# Patient Record
Sex: Male | Born: 1935 | Race: White | Hispanic: No | Marital: Married | Smoking: Former smoker
Health system: Southern US, Community
[De-identification: ages and names within clinical notes are randomized; demographics above are authoritative.]

## PROBLEM LIST (undated history)

## (undated) DIAGNOSIS — J439 Emphysema, unspecified: Secondary | ICD-10-CM

## (undated) DIAGNOSIS — B019 Varicella without complication: Secondary | ICD-10-CM

## (undated) DIAGNOSIS — R519 Headache, unspecified: Secondary | ICD-10-CM

## (undated) DIAGNOSIS — I499 Cardiac arrhythmia, unspecified: Secondary | ICD-10-CM

## (undated) DIAGNOSIS — I10 Essential (primary) hypertension: Secondary | ICD-10-CM

## (undated) DIAGNOSIS — R51 Headache: Secondary | ICD-10-CM

## (undated) DIAGNOSIS — Z860100 Personal history of colon polyps, unspecified: Secondary | ICD-10-CM

## (undated) DIAGNOSIS — K5792 Diverticulitis of intestine, part unspecified, without perforation or abscess without bleeding: Secondary | ICD-10-CM

## (undated) DIAGNOSIS — J45909 Unspecified asthma, uncomplicated: Secondary | ICD-10-CM

## (undated) DIAGNOSIS — T7840XA Allergy, unspecified, initial encounter: Secondary | ICD-10-CM

## (undated) DIAGNOSIS — M1990 Unspecified osteoarthritis, unspecified site: Secondary | ICD-10-CM

## (undated) HISTORY — DX: Emphysema, unspecified: J43.9

## (undated) HISTORY — DX: Allergy, unspecified, initial encounter: T78.40XA

## (undated) HISTORY — DX: Cardiac arrhythmia, unspecified: I49.9

## (undated) HISTORY — DX: Diverticulitis of intestine, part unspecified, without perforation or abscess without bleeding: K57.92

## (undated) HISTORY — DX: Headache: R51

## (undated) HISTORY — DX: Unspecified asthma, uncomplicated: J45.909

## (undated) HISTORY — PX: TONSILLECTOMY: SUR1361

## (undated) HISTORY — DX: Varicella without complication: B01.9

## (undated) HISTORY — DX: Personal history of colon polyps, unspecified: Z86.0100

## (undated) HISTORY — PX: BACK SURGERY: SHX140

## (undated) HISTORY — DX: Unspecified osteoarthritis, unspecified site: M19.90

## (undated) HISTORY — PX: APPENDECTOMY: SHX54

## (undated) HISTORY — DX: Personal history of colonic polyps: Z86.010

## (undated) HISTORY — DX: Essential (primary) hypertension: I10

## (undated) HISTORY — DX: Headache, unspecified: R51.9

---

## 2006-08-04 ENCOUNTER — Ambulatory Visit (HOSPITAL_COMMUNITY): Admission: RE | Admit: 2006-08-04 | Discharge: 2006-08-04 | Payer: Self-pay | Admitting: Neurology

## 2006-08-04 ENCOUNTER — Encounter: Admission: RE | Admit: 2006-08-04 | Discharge: 2006-08-04 | Payer: Self-pay | Admitting: Neurology

## 2007-11-22 ENCOUNTER — Inpatient Hospital Stay (HOSPITAL_COMMUNITY): Admission: RE | Admit: 2007-11-22 | Discharge: 2007-11-27 | Payer: Self-pay | Admitting: Specialist

## 2007-11-22 ENCOUNTER — Ambulatory Visit: Payer: Self-pay | Admitting: Cardiology

## 2007-11-22 ENCOUNTER — Ambulatory Visit: Payer: Self-pay | Admitting: Emergency Medicine

## 2007-11-24 ENCOUNTER — Encounter: Payer: Self-pay | Admitting: Cardiology

## 2007-11-26 ENCOUNTER — Ambulatory Visit: Payer: Self-pay | Admitting: Physical Medicine & Rehabilitation

## 2008-02-27 ENCOUNTER — Encounter: Admission: RE | Admit: 2008-02-27 | Discharge: 2008-02-27 | Payer: Self-pay | Admitting: Specialist

## 2008-10-14 ENCOUNTER — Encounter: Admission: RE | Admit: 2008-10-14 | Discharge: 2008-10-14 | Payer: Self-pay | Admitting: Specialist

## 2010-10-19 NOTE — Op Note (Signed)
NAMECLERANCE, UMLAND NO.:  [id number]   MEDICAL RECORD NO.:  [id number]          PATIENT TYPE:  INP   LOCATION:  3315                         FACILITY:  [facility]   PHYSICIAN:  Kerrin Champagne, M.D.   DATE OF BIRTH:  May 01, 1926   DATE OF PROCEDURE:  11/22/2007  DATE OF DISCHARGE:                               OPERATIVE REPORT   PREOPERATIVE DIAGNOSIS:  Lumbar spinal stenosis L3-L4 and L4-L5 with  spondylolisthesis at L3-L4 and L4-L5 degenerative type.   POSTOPERATIVE DIAGNOSES:  1. Severe bilateral lateral recess stenosis L3-L4 and L4-L5 with      biforaminal entrapment of the nerve roots, right side greater than      left within the neuroforamen.  2. Grade-I spondylolisthesis, L3-L4.  3. Grade-II spondylolisthesis, L4-L5.  4. Synovial cyst, right L4-L5.   PROCEDURE:  1. Central decompressive laminectomy, L3-L4 and L4-L5 with right L3-L4      and right L4-L5 transforaminal lumbar interbody fusion using local      bone graft and a 11-mm DePuy Concorde cages.  2. Posterior lateral fusion L3-L4 and L4-L5 with DePuy Monarch pedicle      screws and rods internal fixation with use of VITOSS and local bone      graft.  3. Bone marrow aspirate left L3 pedicle vertebral body aspiration,      this is for charging VITOSS.   SURGEON:  Kerrin Champagne, MD   ASSISTANT:  Wende Neighbors, PA-C   ANESTHESIA:  General via orotracheal intubation by Dr. Arta Bruce.   FINDINGS:  As above.  Degenerative type spondylolisthesis L3-L4, L4-L5.  Synovial cyst right side L4-L5 affecting primarily the L5 nerve root.   ESTIMATED BLOOD LOSS:  450 mL.   COMPLICATIONS:  None.   DRAINS:  1. Foley with straight drain.  2. Hemovac, right lower lumbar spine to suction drain.   The patient returned to the PACU in good condition.   HISTORY OF PRESENT ILLNESS:  The patient is a 77 year old male with  history of significant COPD.  He has developed progressive worsening,  discomfort, and  weakness in the right lower extremity with dorsiflexion  weakness.  He has a baseline left foot drop associated with the history  of previous polio as a child.  Because of increasing weakness in his  right lower extremity, he underwent evaluation.  MRI study demonstrated  significant biforaminal stenosis L4-L5 associated with grade I-II  spondylolisthesis degenerative type as well lateral recess stenosis in  L3-L4 and a degenerative grade-I spondylolisthesis of the segment.  Remaining segments did not show any significant stenosis or significant  nerve root compression.  He underwent attempts of conservative  management including therapy to use epidural steroids to pain control.  These have not been of benefit.  This pain has worsened with significant  increasing weakness in the right lower extremities with slight  dorsiflexion.  He was brought to the operating room to undergo a  decompression of L3-L4 and L4-L5 with posterolateral fusion and TLIF  procedure, right side, decompression of the L3 and L4 nerve roots  as  this is the pattern of this pain and weakness and posterolateral fusion  with adequate screw instrumentation for stabilization of the 2 level  degenerative spondylolisthesis.   INTRAOPERATIVE FINDINGS:  As above.   DESCRIPTION OF PROCEDURE:  After adequate general anesthesia, the  patient was transferred to the [facility] spine table.  All pressure points  were well padded and the legs with PAS stockings.  A Foley catheter was  installed prior to turning.  The patient had placement of  neuromonitoring leads in the lower extremities and upper extremities.  A  standard preoperative antibiotics with vancomycin for PENICILLIN allergy  of BIAXIN and MACROLIDE allergies.  The patient had a pre-incision time  out at which time, the patient was identified as well as personal within  the operating room.  Concerns regarding the patient's medical status  were reviewed as well as his  allergies.  The procedure as described.  A  standard prep with DuraPrep solution from the lower dorsal spine to the  sacral segment was draped in the usual manner.  Iodine Vi-Drape was  used.  The incision extending from approximately L1 to the L5 level  inferiorly through the skin and subcutaneous layers in the midline after  infiltration of Marcaine 0.5% of 1:200000 epinephrine, incision was  carried sharply down to the spinous processes.  Then the incision  carried over the lateral aspects of the spinous process of L5, L4, L3,  L2, and L1.  The paralumbar muscles were then incised off the lateral  aspects of the spinous process of L5, L4, L3, L2 and then some portion  of L1 and carried down laterally exposing the lamina bilaterally.  Cobbs  were used to elevate the paralumbar muscles and the electrocautery was  used to align the attachment at the level of bone at the level of the  facets.  The facet gaps of L3-L4 and L4-L5 were removed bilaterally and  the dissection was then carried laterally.  Intraoperative lateral  radiograph was obtained with clamps over the first spinous process of  L2, L3, and L4, which were then moved down to L3, L4 and L5, identifying  these levels, they were marked with Bovie electrocautery as well as  marking pen.  Next, the exposure was continued out laterally exposing a  transverse process at L5, L4, L3 at the L2-L3 facet level L3-L4 facet  level and L4-L5 facet levels.  The facets at the L3-L4 and L4-L5 level  noted to be extremely hypertrophic and enlarged.   Bleeders controlled using bipolar and monopolar electrocautery.  The  patient then had placement of bipolar retractor, which was installed for  excellent exposure.   Final gutters were then carefully packed with lap pads in order to  control bleeding and hemostasis here.  The spinous process of L4 and L3  were then resected as well as inferior third of the L2 spinous process.  These were then  taken down to the central portion of the lamina of the  L4 and L5.  The lamina posteriorly was then pinned using Leksell  rongeur.  Osteotomes were then used to osteotomized the facet on the  right side of the L4-L5 level and L3 level resecting the intrarticular  process of L4 and at the L3 in order to expose the facet joint itself.  Superior articular process of both L5 and of L4 were similarly resected  at the level of superior aspect of the pedicle in both levels.   Osteotomes were  used to further osteotomized the medial aspect of the  facet of the left L4-L5 and L3-L4 levels.  More than approximately 30%  to 40% of the facet and decompressing the lateral canal using Kerrison  to the level of pedicle of L5, L4, and L3.  A foraminotomy was performed  over the both L5 nerve roots and the lateral recess was decompressed  using osteotomes to osteotomized the superior articular process medially  at L5 bilaterally as well as L4 bilaterally.  Ligamentum flavum was  debrided.  The L2-L3 level decompressing this and exiting them centrally  as well.  The L5 nerve roots were exiting freely and this created hockey-  stick nerve probe passing up to the neuroforamen both sides.  There was  hypertrophic ligamentum flavum within the neuroforamen at the L4-L5  level and this was resected using a 3-mm Kerrison loop-magnification  headlamp proceeding from both the L4-L5 neuroforamen and then resecting  hypertropic flavum as well as superior articular process of L5 on the  left side in order to decompress left L4 nerve root.  Similarly, this  was carried down to the L3-L4 level beginning about the L3 nerve root  resecting then lateral recess as well as pars area in order to  decompressing the nerve facet exiting out the L3-L4 neuroforamen on the  left side.  The thrombin-soaked Gelfoam was placed in the lateral  gutters to obtain hemostasis.  On the right side in the pars region  overlying the L3-L4  neuroforamen on the right side was resected  beginning above at the L2-L3 level and using 3-mm Kerrison decompressing  lateral recess with the exiting point of the L3 nerve root resecting the  pars area and then resecting the superior articular process L4 as  described using osteotomes as well 3-mm Kerrison fully decompressing the  L3 nerve root.  Similarly, the lateral recess L4-L5 was fully  decompressed and this was performed by first turning above at the L3-L4  level and resecting bone along the medial aspects of the pedicle at L4  then at the L4 neuroforamen of the L4-L5 level resecting hypertrophic  ligament of flavum as well the synovial cyst that was attached in the  right side thecal sac at this level using Kerrisons pituitary rongeurs.  The entire synovial cyst was resected.  The L4 nerve root decompressed  down to the right neuroforamen at L4-L5.  The pedicles were easily seen  medially then at the L3-L4 and L4-L5 levels.  Hypertrophic changes  involving the posterior aspect of the superior articular process L5-L5  were resected down in order to expose the intersection of the transverse  process of the lateral aspect pedicle at each level.  Then beginning on  the left side C-arm fluoro was brought into the filed at least  initially, to identify levels and obtained visualization of markers over  the spinous process before the beginning of decompression.  At this  point, the C-arm fluoro was brought in to the field and an awl was then  used to make an initial entry point on the left side with the  intersection of the transverse process of L3 and the lateral aspect of  the pedicle at L3.  Direct in convergence.  A straight pedicle finder  was then used to probe the pedicle and it probed to 55 mm.  This was on  the left side.  After probe used to probe the pedicle, demonstrating the  patency of the pedicle as well  as no sign of broaching of cortex or  penetration of cortex lateral  or medial otherwise.   As this was completed, then a tap was then used to tap the pedicle after  first aspirating some 10 mL of bone marrow from the vertebral body  through the transpedicular root using the VITOSS aspiration equipment.  This was then applied to 10 mL of VITOSS material cut into appropriate  matchstick like quality.  After tapping the pedicle into a 6.25 tap, a  55-mm x 16.25 screw was placed after first decorticating transverse  process of L3 applying VITOSS out laterally over this transverse process  assuring the DePuy Monarch pedicle screw.  Obtained an excellent  purchase.  Next, an awl was then placed at the intersection transverse  process at L4 with that of the lateral aspect of the pedicle of L4  observed with C-arm fluoro to be in good position and alignment.  After  being made into the lateral aspect of the cortex here and then a  handheld straight pedicle finder used to probe the pedicle down again to  55 mm.  A 6.25 screw was chosen and this was tapped first with 5.5 and  then decortication carried down with transverse process checking the  hole for the pedicle screw using ball tip probe ensuring patency and no  sign of broaching of cortex.  Next, the 6.25 screw x 55 mm screw was  placed obtaining excellent purchase in the VITOSS bone material.  A bone  extender was then placed over the transverse process at L4.  Entry point  then determined at the L5 level over the lateral aspect of the left side  using an awl at the intersects of the transverse process of L5 with the  lateral aspect of the pedicle at this level.  Observed C-arm fluoro to  be in good position alignment and a straight pedicle finder was used to  probe the pedicle to approximately 15 mm.  A 7.0 screw x 15 mm was  chosen.  The hole for the pedicle screw was tapped to 6.25 and then the  appropriate size 7.0 screw was placed without difficulty obtaining  excellent purchase here on the left side.   Additional VITOSS material  was placed over the transverse process at L5 following its decortication  using a high speed.  Attention was then turned to the right side where  similarly screws were then placed to the L3, L4 and L5 levels first  performing entry point using awl at the L2-L3 level for the L3 pedicle  at the intersection of transverse process at L3 with the pedicles L3  laterally.  A small amount of material was removed from the inferior  aspect of the superior articular process of L3 in order to obtain the  entry point.  A straight pedicle probe was then used to probe the  pedicle to the depth of 50 mm.  A 6.25 x 50 mm screw was chosen for the  right side.  A tapping was performed using a 5.5 tap.  Decortication of  transverse process, application of VITOSS and then a placement of the  pedicle screw measuring 6.25 x 50 mm was placed on the right side at L3.  Next, the awl was placed at the intersection of the transverse process  of L4 with the lateral aspect of the pedicle at L4 then again entry  point determined using C-arm fluoro.  A straight pedicle probe then used  to probe  again to 50 mm and 50 mm length was chosen by 6.25.  Tapping  with 5.5 decorticating transverse process of L4 and the pars area where  any residual left.  The pedicle screw was then inserted, 15mm x 6.25 in  the right side.  This obtained again an excellent purchase.  In right  side of the L5 level, the intersection of the transverse process the  lateral aspect of the pedicle was determined again using an awl and C-  arm fluoro.  This appeared to be in good position alignment.  Initial  entry point was then made and straight pedicle probe then used probe  pedicle on the right side of the L5 to a depth of 50 mm.  Tapping was  with 6.25 tap and a 7.0 screw was inserted into the pedicle right side  at the L5 level obtaining moderate purchase.  Decortication of the  transverse process of L5 was carried out,  VITOSS was placed in the  lateral gutters on the right side extending for the transverse process  of L3 to L4 to L5.  Attention then turned to testing the pedicle screws.  They were each tested greater than 17.  The only screw testing 17 was  right L4 screw.  Remaining screws were all tested at 24 and better. Attention was turned to the placement of rods at each level.  This was  done using a standard curved 65-mm DePuy rod of 0.25 inch, and these  were then placed within the passers on the left side without any  additional contour on the left side and the caps were then placed.  The  most superior cap was tightened to the rod in order to maintain its at  length and the rod to the correct position alignment.  Turned to the  right side, an additional bending of the rod was necessary in order to  allow for it seat adequately at the L4 level.  The rod was then held in  place and passers placed at the L3, L4, and L5 levels and again the  passer at the L3 level tightened to the rod after placing caps in these  levels.  With this then attention was turned to performing the interbody  fusion on the right side at the L4-L5 level.  Thecal sac was carefully  freed up.  Fibrovascular tissue within the axillary area of L4 nerve  root was carefully freed, then cauterized and divided.  Penfield 4 was  used to carefully examine this material and determine this to be  fibrovascular in nature.  Epidural veins were cauterized using bipolar  electrocautery and the thecal sac was able to retract medially the L4  nerve root well above the disk space.  A 15-blade scalpel was used to  incise the disk posterolaterally.  A window was made into the disk  space.  The slip level was thinned.  A straight pituitary with Teeth was  then used to debris this material from the disk space.  Dilation of the  disk space then carried out to 8, 9, 10 and then 11 mm.  A medium and  then large pituitaries were then able to use  debris disk space and  degenerative disk material and some endplate cartilage material.  Cartilage end plates were then debrided further using straight biting  right up and biting right up left curettes as well as straight ring and  up-biting ring curettes.  Pituitary rongeurs were used to further debris  any of this material from the disk space following the decortication of  the endplates.  It notes, the cortical bone was removed only in  cartilaginous endplates.  This was completed, trial was performed within  the disk space using a 8, 9, and 10 and finally 11-mm trial, which  provided the best fit at the L4-L5 level.  This size of 11-mm lordotic  cage was chosen.  This space was then carefully filled with bone graft  that was harvested from local central decompression.  It was morcellized  in a good condition.  The disk space was filled twice and then the bone  graft impacted across the midline using 9-mm trials.  With this end, the  final implant of an 11-mm cage was carefully packed with cancellous bone  that has been obtained previously.  This graft and the cage was then  inserted in a diagonal to the disk space through the right L4  neuroforamen and then impacted into place with the missing time located  medially at both the superior and inferior aspect of this cage.  The  cage was impacted into place and subset beneath the inferior aspect of  the vertebral body of L4.  A 23-mm length gauge was used as this patient  had a significant slip at this level in order to provide for adequate  bone-on-bone contact, but yet to not to exceed the contact between the 2  vertebral bodies that are present.  C-arm fluoro determined the cage to  be in excellent position and alignment.  The inserting device was then  removed.  Bleeders controlled using bipolar electrocautery including  epidural and Gelfoam with thrombin soaked was placed in the lateral  recess.  However, the posterior aspect of the  disk space continue  control the  bleeding here.  Attention turned to L3-L4 level on the  right side with thecal sac less carefully freed up at the area of the  axillary portion of the L3 nerve root.  Bleeders controlled using  electrocautery and Penfield 4 used to carefully freed up the  fibrovascular tissue within the axillary area here and electrocautery in  addition with a 15-blade scalpel allowing for mobilization of the thecal  sac medially.  This mobilized and the disk space was easily incised  using a 15-blade scalpel and a window of disk material was removed from  the posterior lateral region of the disk.  This was then dilated using  8, 9, and 10 and then a 11-mm dilators.  Disk material was then debrided  from the disk space using pituitary rongeurs, medium to then large.  Once this was completed, then disk space was further prepared using  debridement of the endplates of cartilaginous material using a straight  curettes as well as the up-biting right and up-biting left curettes and  then the ring curettes both straight and up-biting.  The disk material  cartilaginous endplate was debrided from the disk using pituitary  rongeurs.  Disk space was felt to be well prepared and trial was  performed using first 9 and then the 10 and then the 11 mm trial.  The  11-mm provided the best fit and it was felt that the slip at this level  was not as significant as that is on at 4-5 levels so that a 26-mm cage  was chosen for this level.  This cage and lordotic concrete cage by  DePuy was then packed with local bone graft and then harvested and  morselized cancellous portions.  Additional bone grafts were then packed  within the area of vertebral disk space using Band-Aids twice and then  impacting with a 9-mm trial in order to provide room for the cage.  Cage  and 11-mm lordotic cage was then inserted diagonal to the posterior  aspect of the disk and inserted across the midline retracting  the thecal  sac at L4 nerve root to protect it.  The cage was easily inserted and  placed a subset beneath the posterior aspect of disk space at L3-L4  level about 3-4 mm.  Intraoperative C-arm fluoro demonstrated the cage  in good position alignment.  The markers of the cage subset beneath the  ventral to the posterior aspect of the disk space by at least 3-4 mm.  Irrigation was carried out.  Bleeders controlled using bipolar  electrocautery.  Gelfoam thrombin soak was then placed.  Next, the  fastener at the L3 level both right and left side was then tightened to  80 foot pounds.  Compression obtained on the right side between the L3  and L4 fasteners and the fastener at the L4 level tightened with 80 foot  pounds.  Similarly, then compression was obtained between the L3 and L4  fasteners on the left side and the cath on the left side at the L4 level  tightened then to 80 foot pounds as well.  Then returned to the right  side.  Compression obtained between the fastener of L4 and L5 and the L5  cap was then tightened to 80 foot pounds marking fasteners and the rods  at this level on the right and on the left again compression between the  L4 and L5 fasteners of the pedicle screws and the cap to the L5 as there  was tightened to 80 foot pounds locking the rod to the fastener at the  screw at the L5 level left side.  A transverse loading rod was then  placed at the level between the L3 and L4 pedicle screws attaching the 6-  type size transverse loading rod on the DePuy kit to each of the rods. The right-sided screw head for the fastening of the hook to the rod  appear to show some stripping with the application of the rod obtaining  the excellent purchase.  Each of the hooks to the rod were tightened at  80 foot pounds and then the central screw was tightened for connection  of the lengthening rods and this was then tightened also to 80 foot  pounds.  This provided excellent fixation of  the posterior aspect of the  vertebral bodies extending from L3 to L5.  Irrigation was carried out.  Intraoperative C-arm fluoro demonstrated the pedicle screws and rods in  excellent position alignment extending from L3-L5.  Presences of cages  at both L4-L5 and L3-L4 appeared to be in good position alignment.  Marker was placed on the right side for identification of the sides.  Irrigation was then fully carried out.  Additional bone graft that was  available with local bone graft was then placed out posterolaterally  extending from the transverse process from L3-L4 to L5 level on the  level on the left side and then on the right side.  This provided  excellent additional bone material.  Hockey-stick nerve probe was passed  down to the neuroforamen bilaterally at L3, L4, and L5 demonstrating the  patency and no sign of further compression at either level.  Excess  Gelfoam was then removed from the right side of the L3-L4 level, L4-L5  TLIF areas.  Careful inspection of the laminotomy region.  The area of  TLIF assured that there was no bone material within the spinal canal  remaining residual.  With this done a small amount of Gelfoam was then  placed out over the TLIFs on the right side and after further irrigation  and soft tissues were allowed to fall back into the place.  The  paralumbar muscles debrided of any devitalized tissue and then a medium  Hemovac drain placed in the depth of the incision running on the left  spinous process of L2, L1 and then exiting over the right lower lumbar  spine.  The paralumbar muscle was loosely approximated in the midline  with interrupted #1 Vicryl sutures.  A lumbar dorsal fascia was  reapproximated in the spinous process at L5-S1 inferiorly,and superiorly  L1-L2.  The paralumbar and lumbodorsal fascia were reapproximated in the  midline with interrupted #1Vicryl sutures.  The subcu layers  approximated with interrupted 0 Vicryl and superficial  layers with  interrupted 2-0 Vicryl sutures and the skin closed with running subcu  stitch with 4-0 Vicryl.  The patient then had Dermabond applied to close  the skin.  The 4 x 4s and ABD pads were fixed to the skin with Hypafix  tape.  The patient was then returned to supine position, reactivated an  extubated, and returned to the recovery room in satisfactory condition.  Note that the patient's preoperative SSEPs were not able to be used at  the beginning of the case as he did not show any significant sensory-  evoked potential in the legs, upper extremities were normal throughout  the case.  Soft tissue resistance testing and screws, however, was vital  and was carried down during the case.  All instruments and sponge counts  were correct at the end of the case.  The case required the use of PA  for retraction of neural structures and for visualization of all  areas of pedicle screw insertion and therefore, retraction of neural  structures during the compression and the use of suction equipment.  Initially, bone graft preparation was carried out by the PA and the  closure of incision.   COMPLICATIONS:  None.      Kerrin Champagne, M.D.  Electronically Signed     JEN/MEDQ  D:  11/22/2007  T:  11/23/2007  Job:  [id number]

## 2010-10-19 NOTE — Consult Note (Signed)
NAME:  [name], [name] NO.:  [id number]   MEDICAL RECORD NO.:  [id number]          PATIENT TYPE:  INP   LOCATION:  2918                         FACILITY:  [facility]   PHYSICIAN:  Gerrit Friends. Dietrich Pates, MD, FACCDATE OF BIRTH:  09-01-1925   DATE OF CONSULTATION:  11/24/2007  DATE OF DISCHARGE:                                 CONSULTATION   REFERRING PHYSICIAN:  Dr. Carilyn Goodpasture.   PRIMARY CARE PHYSICIAN:  Dr. Keturah Barre in [place].   HISTORY OF PRESENT ILLNESS:  An 77 year old gentleman with no prior  cardiac history, referred for assessment and treatment of paroxysmal  supraventricular tachycardia with hypotension.  [name] has never  previously been seen by a cardiologist.  He has not undergone any  significant cardiac testing.  He has no history of chest discomfort.  He  does have chronic stage II dyspnea on exertion related to COPD.  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  He has had significant back pain impairing his mobility and  prompting surgical intervention 2 days ago.  This morning, he was noted  to have a very rapid heart rate.  Oxygen saturation declined to the high  80s.  Initially, his blood pressure was elevated, but subsequently  systolic blood pressures, however, at around 70.  He was absolutely  asymptomatic with no palpitations, no dyspnea, no weakness, and no other  apparent effects of his SVT.   Past medical history is notable for COPD with a 60-80-pack-year history  of cigarette smoking.  He had tapered recently to one half pack per day,  but subsequently increased back to one pack per day.  He has no chronic  cough and no sputum production.  He has not been hospitalized for lung  problems.  He does have some exercise limitation, but this is modest.   He had polio as a child.  He reports some subsequent left leg weakness  and has been seen by Dr. Sandria Manly; however, he continues to climb ladders,  does  not use a brace on his legs, and does not really appear to be very  symptomatic related to this neurologic problem.   He was hospitalized in [place] for lower GI bleed in 2004.  The exact  severity and etiology of his hemorrhage is not known.   He also carries the diagnoses of chronic right ankle pain,  hyperlipidemia, and multiple actinic keratoses.   Prior surgeries have included appendectomy, tonsillectomy, and inguinal  herniorrhaphy.   He reports an allergy to PENICILLIN, BIAXIN, and NONSTEROIDAL  ANTIINFLAMMATORY AGENTS.   Recent medications include Flomax 0.4 mg daily, Xanax 0.5 mg q.8 h.  p.r.n., Spiriva by MDI, aspirin one daily, Metamucil, hydrocodone, and  Metanx.   SOCIAL HISTORY:  Married and lives near [place]; retired for the past  month; drinks alcohol regularly but not excessively.   FAMILY HISTORY:  No prominent coronary disease.   Review of systems is notable for recent urinary hesitancy and nocturia 3-  4 times per night.  This has not improved  substantially with Flomax.  He  experiences migraine headaches.  He has suffered a few falls  intermittently.  He reports frequent bruising of the skin with minor  trauma.  He eats a regular diet.  He requires corrective lenses for near  and far vision.  He has bilateral hearing impairment.  He experiences  middle of the night awakening.  All other systems reviewed and are  negative.   PHYSICAL EXAMINATION:  VITAL SIGNS:  Initially, blood pressure was 70  palpable and heart rate 175.  HEENT:  Pallor; respiratory rate slightly elevated; normal lids and  conjunctiva; normal oral mucosa.  NECK:  No jugular venous distention; no carotid bruits.  ENDOCRINE:  No thyromegaly.  HEMATOPOIETIC:  No adenopathy.  SKIN:  Multiple ecchymoses.  Not diaphoretic.  THORAX:  Increased AP diameter; decreased breath sounds at the bases; no  rales.  CARDIAC:  Normal first and second heart sounds; rapid regular rhythm.  ABDOMEN:   Soft and nontender; no masses; no organomegaly.  EXTREMITIES:  Trace edema; pneumatic compression stockings in place.   EKG:  Supraventricular tachycardia at a rate of 172; T-wave flattening;  no apparent atrial activity; minor nonspecific ST-T wave abnormality.   INITIAL HOSPITAL COURSE:  Carotid sinus massage was undertaken over the  left carotid with reversion to sinus rhythm.  Subsequently, PACs and  short runs of SVT occurred followed by the recurrence of a sustained  PSVT within a minute or two of the initial conversion.  Subsequently,  500 mL of normal saline and diltiazem 10 mg intravenous were  administered.  The patient briefly returned to sinus rhythm, then  sustained SVT, and then back to sinus rhythm after a transient slowing  of the heart rate to 160 beats per minute.   IMPRESSION:  [name] presents with an initial episode of paroxysmal  supraventricular tachycardia, which is quite unusual at age 45, but  certainly not unheard of.  This occurs in the setting of substantial  physiologic stress including blood loss of approximately 5-6 units at  surgery.  We will follow CBCs.  A TSH level will be checked.  He will be  started on oral diltiazem at a dose of 60 mg t.i.d.  I suspect that his  arrhythmia will be easily controlled with medications and that  intervention will not need to be considered.  Myocardial infarction will  be ruled out.  Pulmonary embolism is a consideration in light of his  postoperative status, but generally will cause an atrial fibrillation or  flutter, not PSVT.  No testing is warranted to rule out that diagnosis  at the present time.  We appreciate the request for consultation and  will be happy to follow this very nice gentleman with you.  I doubt that  his cardiac problems will delay his recovery from surgery at all.      Gerrit Friends. Dietrich Pates, MD, [facility]  Electronically Signed     RMR/MEDQ  D:  11/24/2007  T:  11/24/2007  Job:  [id number]

## 2010-10-19 NOTE — Consult Note (Signed)
NAMEANTONIO, Marks NO.:  [id number]   MEDICAL RECORD NO.:  [id number]          PATIENT TYPE:  INP   LOCATION:  5039                         FACILITY:  [facility]   PHYSICIAN:  Lonia Blood, M.D.DATE OF BIRTH:  10-25-1925   DATE OF CONSULTATION:  11/26/2007  DATE OF DISCHARGE:                                 CONSULTATION   PRIMARY CARE PHYSICIAN:  Unassigned/[place].   CARDIOLOGIST:  Gerrit Friends. Dietrich Pates, MD, [facility] with [place].   REASON FOR CONSULTATION:  Possible pneumonia and COPD.   HISTORY OF PRESENT ILLNESS:  [name] is a very pleasant 77-year-  old gentleman who was admitted to hospital in the care of Dr. Vira Browns on November 22, 2007 and subsequently underwent a laminectomy and  fusion of the lumbar spinal region secondary to severe spinal stenosis.  In the postoperative period, he has suffered with paroxysmal  supraventricular tachycardia.  Dr. [place] Bing with [facility]  Cardiology has provide consultative assistance to care for this focused  problem.  Otherwise, there has recently been raise the question of a  possible right lower lobe pneumonia versus with possible COPD  exacerbation.  Due to low O2 saturations, I was consulted for medical  care during the patient's hospitalization.   The patient is seen in his hospital room on the 5000 unit.  He is  currently eating lunch without difficulty.  He does express some  intermittent shortness of breath, but at rest he states that he is quite  fine.  He specifically denies chest pain of any sort.  He is not aware  of fevers or chills.  He denies hematemesis, hemoptysis, or  hematochezia.  He states that he is moving his bowels but minimally.  He  does not have abdominal pain.  He does not feel distended.  There has  been no headache, nausea, or vomiting.   REVIEW OF SYSTEMS:  A comprehensive review of systems is unremarkable,  with the exception of positive elements noted in the history of  present  illness above.   PAST MEDICAL HISTORY:  1. COPD, with longstanding history of tobacco abuse.  2. A 70 pack-year history of tobacco abuse , currently 1 pack per day.  3. History of polio as a child leading to chronic left lower extremity      weakness.  4. Lower GI bleed in 2004, details unclear.  5. Hyperlipidemia.  6. Benign prostatic hypertrophy.  7. Status post appendectomy.  8. Status post tonsillectomy and adenoidectomy.  9. Status post inguinal hernia repair.   OUTPATIENT MEDICATIONS:  Doses reviewed:  1. Flomax.  2. Xanax.  3. Spiriva.  4. Aspirin.  5. Metamucil.  6. Metanx.   ALLERGIES:  1. PENICILLIN.  2. BIAXIN.  3. NONSTEROIDALS.   FAMILY HISTORY:  Noncontributory, given the patient's advanced age.   SOCIAL HISTORY:  The patient is married.  He lives with his wife in  [place].  He is retired from [employer].  He rarely partakes of alcohol, but  not to excess.   DATA REVIEW:  Hemoglobin is 9.4.  White count is 10.1, with a platelet  count of 141, and normal MCV.  Sodium is borderline low at 132.  Potassium, chloride, bicarbonate, BUN, and creatinine are normal.  Serum  glucose is mildly elevated at 121.  Calcium is normal.  CK peaked at  900, and then decreased to 629.  CK-MB was 12.4 and has decreased to  5.7, with troponin I originally at 0.08 and decreasing to 0.07.  TSH is  0.885.  Echo reveals mild to moderate diastolic dysfunction, but no  evidence of severe systolic dysfunction.  Chest x-ray yesterday revealed  improved aeration of the right base, but on November 24, 2007 raised a  question of a possible right lower lobe pneumonia.   PHYSICAL EXAMINATION:  VITAL SIGNS:  Temperature 97.9, with a T-max of  98.2, blood pressure 112/64, heart rate 88, respiratory rate 20, O2  saturations 96% on 2 liters per minute, 88% room air.  GENERAL:  Well-developed, well-nourished gentleman, in no acute  respiratory distress at the present time, though with some  mild  wheezing.  HEENT:  Normocephalic, atraumatic.  Pupils equal, round, reactive to  light and accommodation.  Extraocular muscles intact bilaterally.  OC/OP  clear.  NECK:  No JVD.  LUNGS:  Clear to auscultation throughout, but poor air movement in all  fields, with very mild expiratory wheeze appreciable .  No focal  crackles.  CARDIOVASCULAR:  Distant, but regular rate and rhythm, without gallop or  rub, with normal S1 and S2.  ABDOMEN:  Nontender, nondistended, soft.  Bowel sounds present.  No  hepatosplenomegaly.  No rebound, no ascites.  EXTREMITIES:  No significant cyanosis, clubbing, edema of bilateral  lower extremities.  NEUROLOGIC:  Alert and oriented x4.  Cranial nerves II-XII intact  bilaterally.  5/5 strength bilateral upper extremities.  Lower  extremities not tested due to recent surgery.  Intact sensation to touch  throughout.   RECOMMENDATIONS:  1. Questionable right lower lobe pneumonia.  I have of personally      reviewed the patient's x-rays.  The images themselves are not      convincing to me of a significant pneumonia.  Avelox has been      initiated by the attending physician, and I do not disagree with      its use temporarily.  I feel that this area is likely, however,      more reflective of significant atelectasis.  I am concerned,      however, that this patient, who has suffered with some hypoxia,      also has had an episode in which he suddenly developed a      tachycardia and mild bump in his troponins postoperatively.  This      certainly raises a significant question of pulmonary embolism.      Though atrial fibrillation and flutter may typically be the primary      arrhythmias noted with pulmonary embolism, I do feel that the      pretest probability in this patient's situation is sufficiently      high to justify ruling out a large central PE with CT scan of the      chest.  The patient does have normal renal function, and we will be       sure to gently hydrate him.  Certainly, the patient's tachycardia      could be explained by other reasons, but D-dimer would be unhelpful      in this situation, as  it will almost assuredly be elevated      postoperatively, and I would feel most comfortable completely      ruling out the possibility of pulmonary embolism.  Otherwise, we      will increase the patient's treatment for baseline emphysema, as      this is most likely the cause of his mild hypoxemia.  2. Paroxysmal supraventricular tachycardia.  This issue is being      attended to quite nicely by [facility].  I have nothing      further to add in regard to this specific issue.  3. Tobacco abuse.  I have counseled the patient as to the multiple      deleterious effects of ongoing tobacco abuse and advised him to      discontinue smoking immediately.  He voices understanding of such.  4. Acute blood loss anemia.  The patient is suffering with an anemia,      which most certainly is related to his surgery.  We will follow his      hemoglobin trend and transfuse further as necessary.  As long as      the patient's hemoglobin remains above 8, however, and he not      suffers severe PSVT, I wish to avoid further transfusion.  5. Prophylaxis.  I will leave the administration of DVT prophylaxis to      the primary team in the postoperative period.   Thank you very much for your consultation on this very pleasant  gentleman.  We will be happy continue to follow along with you to  provide for his medical care.      Lonia Blood, M.D.  Electronically Signed     JTM/MEDQ  D:  11/26/2007  T:  11/26/2007  Job:  [id number]   cc:   Kerrin Champagne, M.D.

## 2010-10-22 NOTE — Discharge Summary (Signed)
[name], [name] NO.:  [id number]   MEDICAL RECORD NO.:  [id number]          PATIENT TYPE:  INP   LOCATION:  5039                         FACILITY:  [facility]   PHYSICIAN:  Kerrin Champagne, M.D.   DATE OF BIRTH:  Oct 07, 1925   DATE OF ADMISSION:  11/22/2007  DATE OF DISCHARGE:  11/27/2007                               DISCHARGE SUMMARY   ADMISSION DIAGNOSES:  1. Lumbar spinal stenosis; L3-L4 and L4-L5 with spondylolisthesis; L3-      L4 and L4-L5, degenerative type.  2. Chronic obstructive pulmonary disease.  3. Benign prostatic hypertrophy.  4. Anxiety.  5. History of polio with subsequent lower extremity weakness.  6. Hyperlipidemia.   DISCHARGE DIAGNOSES:  1. Severe bilateral lateral recess stenosis L3-L4, L4-L5 with      biforaminal entrapment of the nerve roots, right greater than left      with in the neuroforamen.  2. Grade 1 spondylolisthesis L3-L4.  3. Grade II spondylolisthesis L4-L5.  4. Synovial cyst, right L4-L5.  5. Chronic obstructive pulmonary disease with acute exacerbation      during the hospital stay.  6. Benign prostatic hypertrophy.  7. Polio with lower extremity weakness.  8. New onset paroxysmal supraventricular tachycardia.  9. Acute blood loss anemia.  10.Elevated cardiac enzymes, secondary to demand ischemia from      supraventricular tachycardia.  11.Hyponatremia, mild.   PROCEDURE:  On November 22, 2007, the patient underwent;  1. Central decompressive laminectomy L3-L4 and L4-L5 with right L3-L4      and right L4-L5 transforaminal lumbar interbody fusion utilizing      local bone graft and DePuy CONCORDE cages.  2. Posterolateral fusion at L3-L4 and L4-L5 with pedicle screws and      rod instrumentation, VITOSS and local bone graft.  3. Bone marrow aspirate, left L3 pedicle and vertebral body.  This was      performed by Dr. Otelia Sergeant, assisted by Maud Deed, PA-C under      general anesthesia.   CONSULTATIONS:  1. [facility]  hospitalist.  2. Gerrit Friends. Dietrich Pates, MD, [facility].  3. Leslye Peer, MD of Pulmonology.  4. Ellwood Dense, MD of Physical Medicine and Rehabilitation.   BRIEF HISTORY:  The patient is a 77 year old male with significant  history of COPD.  He has developed progressive weakness in the lower  extremities.  He has a baseline left foot drop associated with history  of polio as a child.  He has had increasing weakness in the right lower  extremity, which has prevented him from activities.  MRI has  demonstrated biforaminal stenosis at L4-L5 associated with a grade I to  II spondylolisthesis, degenerative type as well as lateral recess  stenosis in the L3-L4 level with degenerative grade I spondylolisthesis  at this segment.  He has attempted conservative treatment including  epidural steroid injections, however, these only temporizes discomfort  and have not helped with his increasing weakness in the right lower  extremity.  It was felt he would benefit from surgical intervention and  was admitted for the  procedure as stated above.   BRIEF HOSPITAL COURSE:  The patient tolerated the procedure under  general anesthesia without complications.  Postoperatively, he was  transferred to the orthopedic floor to initiate his postoperative course  of physical therapy for ambulation and gait training as well as range of  motion of the lower extremities.  He was fitted with a Aspen LSO to wear  at all times when out of bed.  He demonstrated baseline weakness with  dorsiflexion of bilateral feet.  Foley was discontinued on the first  postoperative day.  Fortunately, this did not have to be reinserted and  the patient was able to void independently.  He was treated with  incentive spirometry and encouragement for coughing and deep breathing.  On November 24, 2007, at 7:30 a.m., the patient developed elevated heart  rate with decreased breath sounds and hypoactive bowel sounds.  EKG was  done  and the patient was noted to have supraventricular tachycardia.  [facility] was consulted and the patient was seen by Dr.  Dietrich Pates.  He was felt to have new onset of paroxysmal supraventricular  tachycardia induced most likely by stress of surgery and blood loss.  TSH level was checked, which was within normal limits.  The patient was  started on oral diltiazem.  Cardiac enzymes were obtained and slightly  elevated, which was felt to be related to demand ischemia, secondary to  the SVT.  Conversion was obtained and the patient was back in sinus  rhythm.  The patient was transferred to the Telemetry Unit where he was  monitored for a couple of days.  During that time, he also developed  hypoxemia.  Chest x-ray showed possible pneumonia and CT scan was  performed, which indicated no pulmonary embolism and bilateral lower  lobe atelectasis, tiny non obstructing upper pole left renal calculus  noted.  Empirically, he was treated with Avelox for few days and this  was discontinued prior to discharge.  The patient's diet was held until  his bowel sounds returned and he was having flatus.  The patient was  then restarted on a regular diet, which he tolerated well.  Stool  softeners and laxatives were used for bowel movements.  A rehab consult  was obtained and he was felt to be possibly a suitable candidate for  inpatient rehabilitation.  However, the patient was adamant about  returning to his home and worked diligently with physical therapy during  the hospital stay.  Prior to discharge, the patient demonstrated  independence with donning and doffing the Aspen LSO.  He was able to  ambulate 80 feet.  The patient's wound was healing well throughout the  hospital stay.  No signs of infection.   PHYSICAL EXAMINATION:  On the day of discharge, he was afebrile and  vital signs were stable.  He was off nasal oxygen and oxygen saturations  were 95% on room air.   OTHER PERTINENT LABORATORY  VALUES:  Hemoglobin and hematocrit monitored  throughout the hospital stay.  Hemoglobin at the lowest value at 9.4  with hematocrit 27.2.  At discharge hemoglobin and hematocrit 28.7.  Chemistry studies on admission within normal limits.  He did have mild  hyponatremia as low as 130.  TSH was within normal limits.  Troponin  initially at 0.08 and repeat the following day at 0.07.  CK initially at  900, repeat at 629.  CK-MB 12.4, repeat at 5.7.   PLAN:  Arrangements were made for the patient to  be discharged to his  home.  Home Health Physical Therapy, Occupational Therapy and durable  medical equipment will be made available to the patient.  The patient  will follow up with his primary care physician this Friday for followup  with his lungs.  He will follow up with Dr. Otelia Sergeant 2 weeks from the date  of surgery.  Dressing change will be done daily at home or on an as-  needed basis and he will be allowed to shower.  The patient will wear  his brace anytime out of bed, but does not have to wear for sleeping  purposes.   DISCHARGE MEDICATIONS:  1. OxyContin 10 mg q.12 hours.  2. Percocet 5/325 mg 1 every 4-6 hours as needed for pain.  3. Humibid 600 mg q.12 hours.  4. Xopenex inhaler 4 times daily.  5. He is encouraged to use over-the-counter stool softener on a daily      basis.   He will also resume his home medications as taken prior to admission and  a list of these were given to him on the medication reconciliation form  with specific instructions to do so.  All questions were encouraged and  answered prior to discharge.   CONDITION ON DISCHARGE:  Stable.      Wende Neighbors, P.A.      Kerrin Champagne, M.D.  Electronically Signed    SMV/MEDQ  D:  12/13/2007  T:  12/14/2007  Job:  [id number]

## 2011-03-03 LAB — CARDIAC PANEL(CRET KIN+CKTOT+MB+TROPI)
CK, MB: 12.4 — ABNORMAL HIGH
CK, MB: 5.7 — ABNORMAL HIGH
Relative Index: 0.9
Relative Index: 1.4
Total CK: 629 — ABNORMAL HIGH
Total CK: 900 — ABNORMAL HIGH
Troponin I: 0.07 — ABNORMAL HIGH

## 2011-03-03 LAB — DIFFERENTIAL
Basophils Absolute: 0
Eosinophils Relative: 0
Lymphocytes Relative: 10 — ABNORMAL LOW
Lymphs Abs: 1
Neutro Abs: 8.2 — ABNORMAL HIGH
Neutrophils Relative %: 81 — ABNORMAL HIGH

## 2011-03-03 LAB — TYPE AND SCREEN
ABO/RH(D): A POS
Antibody Screen: NEGATIVE

## 2011-03-03 LAB — POCT I-STAT 7, (LYTES, BLD GAS, ICA,H+H)
Hemoglobin: 11.2 — ABNORMAL LOW
O2 Saturation: 100
Patient temperature: 34.7
Potassium: 4
TCO2: 28
pO2, Arterial: 335 — ABNORMAL HIGH

## 2011-03-03 LAB — TSH: TSH: 0.885

## 2011-03-03 LAB — CBC
Hemoglobin: 10 — ABNORMAL LOW
Hemoglobin: 13.8
Hemoglobin: 9.4 — ABNORMAL LOW
Hemoglobin: 9.4 — ABNORMAL LOW
MCHC: 34.7
MCHC: 34.8
MCHC: 35.8
MCV: 94.1
MCV: 94.7
MCV: 95.8
Platelets: 136 — ABNORMAL LOW
Platelets: 202
RBC: 2.79 — ABNORMAL LOW
RBC: 2.89 — ABNORMAL LOW
RBC: 2.99 — ABNORMAL LOW
RBC: 4.24
RDW: 13.9
RDW: 13.9
RDW: 14.5
WBC: 10.1
WBC: 10.1
WBC: 11.3 — ABNORMAL HIGH

## 2011-03-03 LAB — BASIC METABOLIC PANEL
BUN: 9
CO2: 23
CO2: 24
CO2: 25
Calcium: 8.2 — ABNORMAL LOW
Chloride: 102
Chloride: 109
Chloride: 98
Creatinine, Ser: 0.86
Creatinine, Ser: 0.89
Creatinine, Ser: 0.9
GFR calc Af Amer: >60
GFR calc Af Amer: >60
GFR calc non Af Amer: >60
Glucose, Bld: 126 — ABNORMAL HIGH
Glucose, Bld: 129 — ABNORMAL HIGH
Potassium: 4
Potassium: 4.3
Sodium: 130 — ABNORMAL LOW
Sodium: 132 — ABNORMAL LOW
Sodium: 139

## 2011-03-03 LAB — HEMOGLOBIN AND HEMATOCRIT, BLOOD
HCT: 29 — ABNORMAL LOW
Hemoglobin: 10.3 — ABNORMAL LOW
Hemoglobin: 11.4 — ABNORMAL LOW
Hemoglobin: 9.5 — ABNORMAL LOW

## 2011-09-15 DIAGNOSIS — Z79899 Other long term (current) drug therapy: Secondary | ICD-10-CM | POA: Diagnosis not present

## 2011-09-15 DIAGNOSIS — E782 Mixed hyperlipidemia: Secondary | ICD-10-CM | POA: Diagnosis not present

## 2011-09-15 DIAGNOSIS — F17200 Nicotine dependence, unspecified, uncomplicated: Secondary | ICD-10-CM | POA: Diagnosis not present

## 2011-09-15 DIAGNOSIS — Z125 Encounter for screening for malignant neoplasm of prostate: Secondary | ICD-10-CM | POA: Diagnosis not present

## 2011-09-24 DIAGNOSIS — I714 Abdominal aortic aneurysm, without rupture: Secondary | ICD-10-CM | POA: Diagnosis not present

## 2011-09-24 DIAGNOSIS — IMO0002 Reserved for concepts with insufficient information to code with codable children: Secondary | ICD-10-CM | POA: Diagnosis not present

## 2011-09-24 DIAGNOSIS — Z981 Arthrodesis status: Secondary | ICD-10-CM | POA: Diagnosis not present

## 2011-11-11 DIAGNOSIS — M545 Low back pain, unspecified: Secondary | ICD-10-CM | POA: Diagnosis not present

## 2011-11-11 DIAGNOSIS — M542 Cervicalgia: Secondary | ICD-10-CM | POA: Diagnosis not present

## 2011-11-11 DIAGNOSIS — M47817 Spondylosis without myelopathy or radiculopathy, lumbosacral region: Secondary | ICD-10-CM | POA: Diagnosis not present

## 2011-11-11 DIAGNOSIS — M5137 Other intervertebral disc degeneration, lumbosacral region: Secondary | ICD-10-CM | POA: Diagnosis not present

## 2012-03-27 DIAGNOSIS — Z23 Encounter for immunization: Secondary | ICD-10-CM | POA: Diagnosis not present

## 2012-10-11 DIAGNOSIS — J441 Chronic obstructive pulmonary disease with (acute) exacerbation: Secondary | ICD-10-CM | POA: Diagnosis not present

## 2012-10-11 DIAGNOSIS — G250 Essential tremor: Secondary | ICD-10-CM | POA: Diagnosis not present

## 2012-10-11 DIAGNOSIS — IMO0002 Reserved for concepts with insufficient information to code with codable children: Secondary | ICD-10-CM | POA: Diagnosis not present

## 2012-10-11 DIAGNOSIS — F411 Generalized anxiety disorder: Secondary | ICD-10-CM | POA: Diagnosis not present

## 2013-01-23 DIAGNOSIS — S93409A Sprain of unspecified ligament of unspecified ankle, initial encounter: Secondary | ICD-10-CM | POA: Diagnosis not present

## 2013-01-23 DIAGNOSIS — S93609A Unspecified sprain of unspecified foot, initial encounter: Secondary | ICD-10-CM | POA: Diagnosis not present

## 2013-01-29 DIAGNOSIS — S93609A Unspecified sprain of unspecified foot, initial encounter: Secondary | ICD-10-CM | POA: Diagnosis not present

## 2013-01-29 DIAGNOSIS — S92309A Fracture of unspecified metatarsal bone(s), unspecified foot, initial encounter for closed fracture: Secondary | ICD-10-CM | POA: Diagnosis not present

## 2013-02-12 DIAGNOSIS — S93609A Unspecified sprain of unspecified foot, initial encounter: Secondary | ICD-10-CM | POA: Diagnosis not present

## 2013-02-12 DIAGNOSIS — S92309A Fracture of unspecified metatarsal bone(s), unspecified foot, initial encounter for closed fracture: Secondary | ICD-10-CM | POA: Diagnosis not present

## 2013-03-05 DIAGNOSIS — M79609 Pain in unspecified limb: Secondary | ICD-10-CM | POA: Diagnosis not present

## 2013-03-05 DIAGNOSIS — S93609A Unspecified sprain of unspecified foot, initial encounter: Secondary | ICD-10-CM | POA: Diagnosis not present

## 2013-03-05 DIAGNOSIS — S9030XA Contusion of unspecified foot, initial encounter: Secondary | ICD-10-CM | POA: Diagnosis not present

## 2013-03-05 DIAGNOSIS — B351 Tinea unguium: Secondary | ICD-10-CM | POA: Diagnosis not present

## 2013-03-22 DIAGNOSIS — S8990XA Unspecified injury of unspecified lower leg, initial encounter: Secondary | ICD-10-CM | POA: Diagnosis not present

## 2013-03-22 DIAGNOSIS — S4980XA Other specified injuries of shoulder and upper arm, unspecified arm, initial encounter: Secondary | ICD-10-CM | POA: Diagnosis not present

## 2013-04-16 DIAGNOSIS — M19019 Primary osteoarthritis, unspecified shoulder: Secondary | ICD-10-CM | POA: Diagnosis not present

## 2013-05-28 DIAGNOSIS — M19019 Primary osteoarthritis, unspecified shoulder: Secondary | ICD-10-CM | POA: Diagnosis not present

## 2013-08-25 DIAGNOSIS — S51809A Unspecified open wound of unspecified forearm, initial encounter: Secondary | ICD-10-CM | POA: Diagnosis not present

## 2013-08-25 DIAGNOSIS — S51009A Unspecified open wound of unspecified elbow, initial encounter: Secondary | ICD-10-CM | POA: Diagnosis not present

## 2013-08-25 DIAGNOSIS — IMO0002 Reserved for concepts with insufficient information to code with codable children: Secondary | ICD-10-CM | POA: Diagnosis not present

## 2013-08-28 DIAGNOSIS — S51809A Unspecified open wound of unspecified forearm, initial encounter: Secondary | ICD-10-CM | POA: Diagnosis not present

## 2013-08-28 DIAGNOSIS — S51009A Unspecified open wound of unspecified elbow, initial encounter: Secondary | ICD-10-CM | POA: Diagnosis not present

## 2013-08-28 DIAGNOSIS — IMO0002 Reserved for concepts with insufficient information to code with codable children: Secondary | ICD-10-CM | POA: Diagnosis not present

## 2013-09-04 DIAGNOSIS — F411 Generalized anxiety disorder: Secondary | ICD-10-CM | POA: Diagnosis not present

## 2013-09-04 DIAGNOSIS — S51809A Unspecified open wound of unspecified forearm, initial encounter: Secondary | ICD-10-CM | POA: Diagnosis not present

## 2013-09-04 DIAGNOSIS — R259 Unspecified abnormal involuntary movements: Secondary | ICD-10-CM | POA: Diagnosis not present

## 2013-09-04 DIAGNOSIS — I498 Other specified cardiac arrhythmias: Secondary | ICD-10-CM | POA: Diagnosis not present

## 2013-10-21 ENCOUNTER — Observation Stay: Payer: Self-pay | Admitting: Internal Medicine

## 2013-10-21 DIAGNOSIS — I10 Essential (primary) hypertension: Secondary | ICD-10-CM | POA: Diagnosis not present

## 2013-10-21 DIAGNOSIS — F17200 Nicotine dependence, unspecified, uncomplicated: Secondary | ICD-10-CM | POA: Diagnosis not present

## 2013-10-21 DIAGNOSIS — I498 Other specified cardiac arrhythmias: Secondary | ICD-10-CM | POA: Diagnosis not present

## 2013-10-21 DIAGNOSIS — G252 Other specified forms of tremor: Secondary | ICD-10-CM | POA: Diagnosis not present

## 2013-10-21 DIAGNOSIS — J449 Chronic obstructive pulmonary disease, unspecified: Secondary | ICD-10-CM | POA: Diagnosis not present

## 2013-10-21 DIAGNOSIS — F411 Generalized anxiety disorder: Secondary | ICD-10-CM | POA: Diagnosis not present

## 2013-10-21 DIAGNOSIS — I4891 Unspecified atrial fibrillation: Secondary | ICD-10-CM | POA: Diagnosis not present

## 2013-10-21 DIAGNOSIS — J438 Other emphysema: Secondary | ICD-10-CM | POA: Diagnosis not present

## 2013-10-21 DIAGNOSIS — R5381 Other malaise: Secondary | ICD-10-CM | POA: Diagnosis not present

## 2013-10-21 DIAGNOSIS — R5383 Other fatigue: Secondary | ICD-10-CM | POA: Diagnosis not present

## 2013-10-21 DIAGNOSIS — Z79899 Other long term (current) drug therapy: Secondary | ICD-10-CM | POA: Diagnosis not present

## 2013-10-21 DIAGNOSIS — R55 Syncope and collapse: Secondary | ICD-10-CM | POA: Diagnosis not present

## 2013-10-21 DIAGNOSIS — G250 Essential tremor: Secondary | ICD-10-CM | POA: Diagnosis not present

## 2013-10-21 LAB — CBC
HCT: 42.3 % (ref 40.0–52.0)
HGB: 14.5 g/dL (ref 13.0–18.0)
MCH: 31.7 pg (ref 26.0–34.0)
MCHC: 34.2 g/dL (ref 32.0–36.0)
MCV: 93 fL (ref 80–100)
Platelet: 169 10*3/uL (ref 150–440)
RBC: 4.56 10*6/uL (ref 4.40–5.90)
RDW: 14 % (ref 11.5–14.5)
WBC: 7.7 10*3/uL (ref 3.8–10.6)

## 2013-10-21 LAB — CK TOTAL AND CKMB (NOT AT ARMC)
CK, TOTAL: 38 U/L — AB
CK, Total: 46 U/L
CK, Total: 44 U/L
CK-MB: 1 ng/mL (ref 0.5–3.6)
CK-MB: 1 ng/mL (ref 0.5–3.6)
CK-MB: 1.1 ng/mL (ref 0.5–3.6)

## 2013-10-21 LAB — BASIC METABOLIC PANEL
ANION GAP: 3 — AB (ref 7–16)
BUN: 14 mg/dL (ref 7–18)
CALCIUM: 9 mg/dL (ref 8.5–10.1)
CHLORIDE: 103 mmol/L (ref 98–107)
CO2: 30 mmol/L (ref 21–32)
CREATININE: 0.93 mg/dL (ref 0.60–1.30)
EGFR (African American): >60
EGFR (Non-African Amer.): >60
Glucose: 106 mg/dL — ABNORMAL HIGH (ref 65–99)
Osmolality: 273 (ref 275–301)
Potassium: 4.4 mmol/L (ref 3.5–5.1)
Sodium: 136 mmol/L (ref 136–145)

## 2013-10-21 LAB — TROPONIN I: Troponin-I: <0.02 ng/mL

## 2013-10-21 LAB — PROTIME-INR
INR: 1
PROTHROMBIN TIME: 13.3 s (ref 11.5–14.7)

## 2013-10-21 LAB — APTT: Activated PTT: 31.9 secs (ref 23.6–35.9)

## 2013-10-22 DIAGNOSIS — I498 Other specified cardiac arrhythmias: Secondary | ICD-10-CM | POA: Diagnosis not present

## 2013-10-22 DIAGNOSIS — J449 Chronic obstructive pulmonary disease, unspecified: Secondary | ICD-10-CM | POA: Diagnosis not present

## 2013-10-22 DIAGNOSIS — F411 Generalized anxiety disorder: Secondary | ICD-10-CM | POA: Diagnosis not present

## 2013-10-22 DIAGNOSIS — I10 Essential (primary) hypertension: Secondary | ICD-10-CM | POA: Diagnosis not present

## 2013-10-22 DIAGNOSIS — R55 Syncope and collapse: Secondary | ICD-10-CM | POA: Diagnosis not present

## 2013-10-22 DIAGNOSIS — I4891 Unspecified atrial fibrillation: Secondary | ICD-10-CM | POA: Diagnosis not present

## 2013-10-22 LAB — CBC WITH DIFFERENTIAL/PLATELET
BASOS PCT: 0.9 %
Basophil #: 0.1 10*3/uL (ref 0.0–0.1)
EOS ABS: 0.2 10*3/uL (ref 0.0–0.7)
Eosinophil %: 2.6 %
HCT: 40.7 % (ref 40.0–52.0)
HGB: 13.9 g/dL (ref 13.0–18.0)
LYMPHS PCT: 32.6 %
Lymphocyte #: 2.2 10*3/uL (ref 1.0–3.6)
MCH: 31.4 pg (ref 26.0–34.0)
MCHC: 34.1 g/dL (ref 32.0–36.0)
MCV: 92 fL (ref 80–100)
MONOS PCT: 10.3 %
Monocyte #: 0.7 x10 3/mm (ref 0.2–1.0)
NEUTROS PCT: 53.6 %
Neutrophil #: 3.7 10*3/uL (ref 1.4–6.5)
Platelet: 145 10*3/uL — ABNORMAL LOW (ref 150–440)
RBC: 4.42 10*6/uL (ref 4.40–5.90)
RDW: 14.5 % (ref 11.5–14.5)
WBC: 6.8 10*3/uL (ref 3.8–10.6)

## 2013-10-22 LAB — BASIC METABOLIC PANEL
ANION GAP: 4 — AB (ref 7–16)
BUN: 15 mg/dL (ref 7–18)
CHLORIDE: 104 mmol/L (ref 98–107)
CO2: 30 mmol/L (ref 21–32)
Calcium, Total: 8.9 mg/dL (ref 8.5–10.1)
Creatinine: 0.79 mg/dL (ref 0.60–1.30)
EGFR (African American): >60
EGFR (Non-African Amer.): >60
Glucose: 96 mg/dL (ref 65–99)
OSMOLALITY: 276 (ref 275–301)
POTASSIUM: 4 mmol/L (ref 3.5–5.1)
SODIUM: 138 mmol/L (ref 136–145)

## 2013-10-23 DIAGNOSIS — R55 Syncope and collapse: Secondary | ICD-10-CM | POA: Diagnosis not present

## 2013-10-23 DIAGNOSIS — I4891 Unspecified atrial fibrillation: Secondary | ICD-10-CM | POA: Diagnosis not present

## 2013-10-23 DIAGNOSIS — I498 Other specified cardiac arrhythmias: Secondary | ICD-10-CM | POA: Diagnosis not present

## 2013-10-24 DIAGNOSIS — I10 Essential (primary) hypertension: Secondary | ICD-10-CM | POA: Diagnosis not present

## 2013-10-24 DIAGNOSIS — R079 Chest pain, unspecified: Secondary | ICD-10-CM | POA: Diagnosis not present

## 2013-10-24 DIAGNOSIS — R072 Precordial pain: Secondary | ICD-10-CM | POA: Diagnosis not present

## 2013-10-24 DIAGNOSIS — I2510 Atherosclerotic heart disease of native coronary artery without angina pectoris: Secondary | ICD-10-CM | POA: Diagnosis not present

## 2013-10-24 DIAGNOSIS — E785 Hyperlipidemia, unspecified: Secondary | ICD-10-CM | POA: Diagnosis not present

## 2013-10-24 DIAGNOSIS — R9431 Abnormal electrocardiogram [ECG] [EKG]: Secondary | ICD-10-CM | POA: Diagnosis not present

## 2013-11-04 DIAGNOSIS — R079 Chest pain, unspecified: Secondary | ICD-10-CM | POA: Diagnosis not present

## 2013-11-04 DIAGNOSIS — I10 Essential (primary) hypertension: Secondary | ICD-10-CM | POA: Diagnosis not present

## 2013-11-04 DIAGNOSIS — R9431 Abnormal electrocardiogram [ECG] [EKG]: Secondary | ICD-10-CM | POA: Diagnosis not present

## 2013-11-04 DIAGNOSIS — E785 Hyperlipidemia, unspecified: Secondary | ICD-10-CM | POA: Diagnosis not present

## 2013-11-04 DIAGNOSIS — I2510 Atherosclerotic heart disease of native coronary artery without angina pectoris: Secondary | ICD-10-CM | POA: Diagnosis not present

## 2013-11-04 DIAGNOSIS — R072 Precordial pain: Secondary | ICD-10-CM | POA: Diagnosis not present

## 2013-11-07 DIAGNOSIS — I10 Essential (primary) hypertension: Secondary | ICD-10-CM | POA: Diagnosis not present

## 2013-11-07 DIAGNOSIS — R072 Precordial pain: Secondary | ICD-10-CM | POA: Diagnosis not present

## 2013-11-07 DIAGNOSIS — I2510 Atherosclerotic heart disease of native coronary artery without angina pectoris: Secondary | ICD-10-CM | POA: Diagnosis not present

## 2013-11-07 DIAGNOSIS — E785 Hyperlipidemia, unspecified: Secondary | ICD-10-CM | POA: Diagnosis not present

## 2013-11-07 DIAGNOSIS — R079 Chest pain, unspecified: Secondary | ICD-10-CM | POA: Diagnosis not present

## 2013-11-07 DIAGNOSIS — R9431 Abnormal electrocardiogram [ECG] [EKG]: Secondary | ICD-10-CM | POA: Diagnosis not present

## 2013-11-15 DIAGNOSIS — R079 Chest pain, unspecified: Secondary | ICD-10-CM | POA: Diagnosis not present

## 2013-11-15 DIAGNOSIS — R9431 Abnormal electrocardiogram [ECG] [EKG]: Secondary | ICD-10-CM | POA: Diagnosis not present

## 2013-11-15 DIAGNOSIS — I10 Essential (primary) hypertension: Secondary | ICD-10-CM | POA: Diagnosis not present

## 2013-11-15 DIAGNOSIS — I2510 Atherosclerotic heart disease of native coronary artery without angina pectoris: Secondary | ICD-10-CM | POA: Diagnosis not present

## 2013-11-15 DIAGNOSIS — E785 Hyperlipidemia, unspecified: Secondary | ICD-10-CM | POA: Diagnosis not present

## 2013-11-15 DIAGNOSIS — R072 Precordial pain: Secondary | ICD-10-CM | POA: Diagnosis not present

## 2013-11-21 DIAGNOSIS — I10 Essential (primary) hypertension: Secondary | ICD-10-CM | POA: Diagnosis not present

## 2013-11-21 DIAGNOSIS — I2510 Atherosclerotic heart disease of native coronary artery without angina pectoris: Secondary | ICD-10-CM | POA: Diagnosis not present

## 2013-11-21 DIAGNOSIS — E785 Hyperlipidemia, unspecified: Secondary | ICD-10-CM | POA: Diagnosis not present

## 2013-11-21 DIAGNOSIS — R9431 Abnormal electrocardiogram [ECG] [EKG]: Secondary | ICD-10-CM | POA: Diagnosis not present

## 2013-11-21 DIAGNOSIS — R072 Precordial pain: Secondary | ICD-10-CM | POA: Diagnosis not present

## 2013-11-21 DIAGNOSIS — R079 Chest pain, unspecified: Secondary | ICD-10-CM | POA: Diagnosis not present

## 2014-03-06 ENCOUNTER — Telehealth: Payer: Self-pay | Admitting: *Deleted

## 2014-03-06 NOTE — Telephone Encounter (Signed)
Informed patient of appt day and time as requested bu Dr. Graciela Husbands

## 2014-03-24 DIAGNOSIS — I2510 Atherosclerotic heart disease of native coronary artery without angina pectoris: Secondary | ICD-10-CM | POA: Diagnosis not present

## 2014-03-24 DIAGNOSIS — I10 Essential (primary) hypertension: Secondary | ICD-10-CM | POA: Diagnosis not present

## 2014-03-24 DIAGNOSIS — I4891 Unspecified atrial fibrillation: Secondary | ICD-10-CM | POA: Diagnosis not present

## 2014-03-24 DIAGNOSIS — R002 Palpitations: Secondary | ICD-10-CM | POA: Diagnosis not present

## 2014-03-24 DIAGNOSIS — R0602 Shortness of breath: Secondary | ICD-10-CM | POA: Diagnosis not present

## 2014-03-24 DIAGNOSIS — E785 Hyperlipidemia, unspecified: Secondary | ICD-10-CM | POA: Diagnosis not present

## 2014-03-26 DIAGNOSIS — E785 Hyperlipidemia, unspecified: Secondary | ICD-10-CM | POA: Diagnosis not present

## 2014-03-26 DIAGNOSIS — I2510 Atherosclerotic heart disease of native coronary artery without angina pectoris: Secondary | ICD-10-CM | POA: Diagnosis not present

## 2014-03-26 DIAGNOSIS — I10 Essential (primary) hypertension: Secondary | ICD-10-CM | POA: Diagnosis not present

## 2014-03-26 DIAGNOSIS — I4891 Unspecified atrial fibrillation: Secondary | ICD-10-CM | POA: Diagnosis not present

## 2014-03-28 DIAGNOSIS — I10 Essential (primary) hypertension: Secondary | ICD-10-CM | POA: Diagnosis not present

## 2014-03-28 DIAGNOSIS — I4891 Unspecified atrial fibrillation: Secondary | ICD-10-CM | POA: Diagnosis not present

## 2014-03-28 DIAGNOSIS — E785 Hyperlipidemia, unspecified: Secondary | ICD-10-CM | POA: Diagnosis not present

## 2014-03-28 DIAGNOSIS — I2510 Atherosclerotic heart disease of native coronary artery without angina pectoris: Secondary | ICD-10-CM | POA: Diagnosis not present

## 2014-03-31 DIAGNOSIS — E785 Hyperlipidemia, unspecified: Secondary | ICD-10-CM | POA: Diagnosis not present

## 2014-03-31 DIAGNOSIS — I10 Essential (primary) hypertension: Secondary | ICD-10-CM | POA: Diagnosis not present

## 2014-03-31 DIAGNOSIS — I2510 Atherosclerotic heart disease of native coronary artery without angina pectoris: Secondary | ICD-10-CM | POA: Diagnosis not present

## 2014-03-31 DIAGNOSIS — I4891 Unspecified atrial fibrillation: Secondary | ICD-10-CM | POA: Diagnosis not present

## 2014-04-01 ENCOUNTER — Ambulatory Visit: Payer: Self-pay | Admitting: Internal Medicine

## 2014-04-29 DIAGNOSIS — E782 Mixed hyperlipidemia: Secondary | ICD-10-CM | POA: Diagnosis not present

## 2014-04-29 DIAGNOSIS — I10 Essential (primary) hypertension: Secondary | ICD-10-CM | POA: Diagnosis not present

## 2014-04-29 DIAGNOSIS — I2510 Atherosclerotic heart disease of native coronary artery without angina pectoris: Secondary | ICD-10-CM | POA: Diagnosis not present

## 2014-06-30 ENCOUNTER — Encounter: Payer: Self-pay | Admitting: *Deleted

## 2014-07-30 ENCOUNTER — Emergency Department: Payer: Self-pay | Admitting: Emergency Medicine

## 2014-07-30 DIAGNOSIS — T25422A Corrosion of unspecified degree of left foot, initial encounter: Secondary | ICD-10-CM | POA: Diagnosis not present

## 2014-07-30 DIAGNOSIS — J449 Chronic obstructive pulmonary disease, unspecified: Secondary | ICD-10-CM | POA: Diagnosis present

## 2014-07-30 DIAGNOSIS — J9601 Acute respiratory failure with hypoxia: Secondary | ICD-10-CM | POA: Diagnosis not present

## 2014-07-30 DIAGNOSIS — T25421A Corrosion of unspecified degree of right foot, initial encounter: Secondary | ICD-10-CM | POA: Diagnosis not present

## 2014-07-30 DIAGNOSIS — N390 Urinary tract infection, site not specified: Secondary | ICD-10-CM | POA: Diagnosis not present

## 2014-07-30 DIAGNOSIS — Z79899 Other long term (current) drug therapy: Secondary | ICD-10-CM | POA: Diagnosis not present

## 2014-07-30 DIAGNOSIS — T543X1A Toxic effect of corrosive alkalis and alkali-like substances, accidental (unintentional), initial encounter: Secondary | ICD-10-CM | POA: Diagnosis not present

## 2014-07-30 DIAGNOSIS — R0902 Hypoxemia: Secondary | ICD-10-CM | POA: Diagnosis not present

## 2014-07-30 DIAGNOSIS — Z720 Tobacco use: Secondary | ICD-10-CM | POA: Diagnosis not present

## 2014-07-30 DIAGNOSIS — F17200 Nicotine dependence, unspecified, uncomplicated: Secondary | ICD-10-CM | POA: Diagnosis not present

## 2014-07-30 DIAGNOSIS — R410 Disorientation, unspecified: Secondary | ICD-10-CM | POA: Diagnosis not present

## 2014-07-30 DIAGNOSIS — I10 Essential (primary) hypertension: Secondary | ICD-10-CM | POA: Diagnosis not present

## 2014-07-30 DIAGNOSIS — F410 Panic disorder [episodic paroxysmal anxiety] without agoraphobia: Secondary | ICD-10-CM | POA: Diagnosis not present

## 2014-07-30 DIAGNOSIS — G930 Cerebral cysts: Secondary | ICD-10-CM | POA: Diagnosis not present

## 2014-07-30 DIAGNOSIS — J90 Pleural effusion, not elsewhere classified: Secondary | ICD-10-CM | POA: Diagnosis not present

## 2014-07-30 DIAGNOSIS — T25621A Corrosion of second degree of right foot, initial encounter: Secondary | ICD-10-CM | POA: Diagnosis not present

## 2014-07-30 DIAGNOSIS — F064 Anxiety disorder due to known physiological condition: Secondary | ICD-10-CM | POA: Diagnosis not present

## 2014-07-30 DIAGNOSIS — T5491XA Toxic effect of unspecified corrosive substance, accidental (unintentional), initial encounter: Secondary | ICD-10-CM | POA: Diagnosis not present

## 2014-07-30 DIAGNOSIS — F05 Delirium due to known physiological condition: Secondary | ICD-10-CM | POA: Diagnosis not present

## 2014-07-30 DIAGNOSIS — R001 Bradycardia, unspecified: Secondary | ICD-10-CM | POA: Diagnosis not present

## 2014-07-30 DIAGNOSIS — R4182 Altered mental status, unspecified: Secondary | ICD-10-CM | POA: Diagnosis not present

## 2014-07-30 DIAGNOSIS — F17210 Nicotine dependence, cigarettes, uncomplicated: Secondary | ICD-10-CM | POA: Diagnosis present

## 2014-07-30 DIAGNOSIS — Z6826 Body mass index (BMI) 26.0-26.9, adult: Secondary | ICD-10-CM | POA: Diagnosis not present

## 2014-07-30 DIAGNOSIS — T25622A Corrosion of second degree of left foot, initial encounter: Secondary | ICD-10-CM | POA: Diagnosis present

## 2014-07-30 DIAGNOSIS — I4891 Unspecified atrial fibrillation: Secondary | ICD-10-CM | POA: Diagnosis present

## 2014-07-30 DIAGNOSIS — R451 Restlessness and agitation: Secondary | ICD-10-CM | POA: Diagnosis not present

## 2014-07-30 DIAGNOSIS — R000 Tachycardia, unspecified: Secondary | ICD-10-CM | POA: Diagnosis not present

## 2014-07-30 DIAGNOSIS — I4581 Long QT syndrome: Secondary | ICD-10-CM | POA: Diagnosis not present

## 2014-07-30 DIAGNOSIS — I5189 Other ill-defined heart diseases: Secondary | ICD-10-CM | POA: Diagnosis not present

## 2014-07-30 DIAGNOSIS — I491 Atrial premature depolarization: Secondary | ICD-10-CM | POA: Diagnosis not present

## 2014-07-30 DIAGNOSIS — I517 Cardiomegaly: Secondary | ICD-10-CM | POA: Diagnosis not present

## 2014-07-30 DIAGNOSIS — R079 Chest pain, unspecified: Secondary | ICD-10-CM | POA: Diagnosis not present

## 2014-07-30 DIAGNOSIS — R339 Retention of urine, unspecified: Secondary | ICD-10-CM | POA: Diagnosis not present

## 2014-07-30 DIAGNOSIS — T320 Corrosions involving less than 10% of body surface: Secondary | ICD-10-CM | POA: Diagnosis not present

## 2014-07-30 DIAGNOSIS — I471 Supraventricular tachycardia: Secondary | ICD-10-CM | POA: Diagnosis not present

## 2014-07-30 DIAGNOSIS — T25221A Burn of second degree of right foot, initial encounter: Secondary | ICD-10-CM | POA: Diagnosis not present

## 2014-07-30 DIAGNOSIS — M7989 Other specified soft tissue disorders: Secondary | ICD-10-CM | POA: Diagnosis not present

## 2014-07-30 DIAGNOSIS — J439 Emphysema, unspecified: Secondary | ICD-10-CM | POA: Diagnosis not present

## 2014-07-30 DIAGNOSIS — J42 Unspecified chronic bronchitis: Secondary | ICD-10-CM | POA: Diagnosis not present

## 2014-07-30 DIAGNOSIS — I959 Hypotension, unspecified: Secondary | ICD-10-CM | POA: Diagnosis not present

## 2014-07-30 DIAGNOSIS — J9811 Atelectasis: Secondary | ICD-10-CM | POA: Diagnosis not present

## 2014-07-30 DIAGNOSIS — I472 Ventricular tachycardia: Secondary | ICD-10-CM | POA: Diagnosis not present

## 2014-07-30 DIAGNOSIS — F419 Anxiety disorder, unspecified: Secondary | ICD-10-CM | POA: Diagnosis present

## 2014-07-30 DIAGNOSIS — R0689 Other abnormalities of breathing: Secondary | ICD-10-CM | POA: Diagnosis not present

## 2014-07-30 DIAGNOSIS — R918 Other nonspecific abnormal finding of lung field: Secondary | ICD-10-CM | POA: Diagnosis not present

## 2014-07-30 DIAGNOSIS — F99 Mental disorder, not otherwise specified: Secondary | ICD-10-CM | POA: Diagnosis not present

## 2014-07-30 DIAGNOSIS — I499 Cardiac arrhythmia, unspecified: Secondary | ICD-10-CM | POA: Diagnosis not present

## 2014-08-25 ENCOUNTER — Ambulatory Visit: Payer: Self-pay | Admitting: Internal Medicine

## 2014-09-03 DIAGNOSIS — R2689 Other abnormalities of gait and mobility: Secondary | ICD-10-CM | POA: Diagnosis not present

## 2014-09-03 DIAGNOSIS — J449 Chronic obstructive pulmonary disease, unspecified: Secondary | ICD-10-CM | POA: Diagnosis not present

## 2014-09-03 DIAGNOSIS — I4891 Unspecified atrial fibrillation: Secondary | ICD-10-CM | POA: Diagnosis not present

## 2014-09-03 DIAGNOSIS — M6281 Muscle weakness (generalized): Secondary | ICD-10-CM | POA: Diagnosis not present

## 2014-09-03 DIAGNOSIS — R410 Disorientation, unspecified: Secondary | ICD-10-CM | POA: Diagnosis not present

## 2014-09-03 DIAGNOSIS — M159 Polyosteoarthritis, unspecified: Secondary | ICD-10-CM | POA: Diagnosis not present

## 2014-09-05 DIAGNOSIS — I4891 Unspecified atrial fibrillation: Secondary | ICD-10-CM | POA: Diagnosis not present

## 2014-09-05 DIAGNOSIS — R410 Disorientation, unspecified: Secondary | ICD-10-CM | POA: Diagnosis not present

## 2014-09-05 DIAGNOSIS — R2689 Other abnormalities of gait and mobility: Secondary | ICD-10-CM | POA: Diagnosis not present

## 2014-09-05 DIAGNOSIS — M159 Polyosteoarthritis, unspecified: Secondary | ICD-10-CM | POA: Diagnosis not present

## 2014-09-05 DIAGNOSIS — M6281 Muscle weakness (generalized): Secondary | ICD-10-CM | POA: Diagnosis not present

## 2014-09-05 DIAGNOSIS — J449 Chronic obstructive pulmonary disease, unspecified: Secondary | ICD-10-CM | POA: Diagnosis not present

## 2014-09-09 ENCOUNTER — Ambulatory Visit (INDEPENDENT_AMBULATORY_CARE_PROVIDER_SITE_OTHER): Payer: Medicare Other | Admitting: Internal Medicine

## 2014-09-09 ENCOUNTER — Encounter: Payer: Self-pay | Admitting: Internal Medicine

## 2014-09-09 VITALS — BP 98/60 | HR 77 | Temp 98.1°F | Wt 175.5 lb

## 2014-09-09 DIAGNOSIS — R51 Headache: Secondary | ICD-10-CM

## 2014-09-09 DIAGNOSIS — J438 Other emphysema: Secondary | ICD-10-CM

## 2014-09-09 DIAGNOSIS — J449 Chronic obstructive pulmonary disease, unspecified: Secondary | ICD-10-CM | POA: Diagnosis not present

## 2014-09-09 DIAGNOSIS — M1990 Unspecified osteoarthritis, unspecified site: Secondary | ICD-10-CM | POA: Diagnosis not present

## 2014-09-09 DIAGNOSIS — I4891 Unspecified atrial fibrillation: Secondary | ICD-10-CM | POA: Diagnosis not present

## 2014-09-09 DIAGNOSIS — R410 Disorientation, unspecified: Secondary | ICD-10-CM | POA: Insufficient documentation

## 2014-09-09 DIAGNOSIS — M6281 Muscle weakness (generalized): Secondary | ICD-10-CM | POA: Diagnosis not present

## 2014-09-09 DIAGNOSIS — R2689 Other abnormalities of gait and mobility: Secondary | ICD-10-CM | POA: Diagnosis not present

## 2014-09-09 DIAGNOSIS — M159 Polyosteoarthritis, unspecified: Secondary | ICD-10-CM | POA: Diagnosis not present

## 2014-09-09 DIAGNOSIS — I480 Paroxysmal atrial fibrillation: Secondary | ICD-10-CM | POA: Diagnosis not present

## 2014-09-09 DIAGNOSIS — R519 Headache, unspecified: Secondary | ICD-10-CM | POA: Insufficient documentation

## 2014-09-09 NOTE — Assessment & Plan Note (Signed)
He continues to smoke, discussed cessation Continue Spiriva daily

## 2014-09-09 NOTE — Patient Instructions (Signed)
Mild Neurocognitive Disorder  Mild neurocognitive disorder (formerly known as mild cognitive impairment) is a mental disorder. It is a slight abnormal decrease in mental function. The areas of mental function affected may include memory, thought, communication, behavior, and completion of tasks. The decrease is noticeable and measurable but for the most part does not interfere with your daily activities.  Mild neurocognitive disorder typically occurs in people older than 60 years but can occur earlier. It is not as serious as major neurocognitive disorder (formerly known as dementia) but may lead to a more serious neurocognitive disorder. However, in some cases the condition does not get worse. A few people with this disorder even improve.  CAUSES   There are a number of different causes of mild neurocognitive disorder:   · Brain disorders associated with abnormal protein deposits, such as Alzheimer's disease, Pick's disease, and Lewy body disease.  · Brain disorders associated with abnormal movement, such as Parkinson's disease and Huntington's disease.  · Diseases affecting blood vessels in the brain and resulting in mini-strokes.  · Certain infections, such as human immunodeficiency virus (HIV) infection.  · Traumatic brain injury.  · Other medical conditions such as brain tumors, underactive thyroid (hypothyroidism), and vitamin B12 deficiency.  · Use of certain prescription medicine and "recreational" drugs.  SYMPTOMS   Symptoms of mild neurocognitive disorder include:  · Difficulty remembering. You may forget details of recent events, names, or phone numbers. You may forget important social events and appointments or repeatedly forget where you put your car keys.   · Difficulty thinking and solving problems. You may have trouble with complex tasks such as paying bills or driving in unfamiliar locations.   · Difficulty communicating. You may have trouble finding the right word, naming an object, forming a  sentence that makes sense, or understanding what you read or hear.  · Changes in your behavior or personality. You may lose interest in the things that you used to enjoy or withdraw from social situations. You may get angry more easily than usual. You may act before thinking. You may do things in public that you would not usually do. You may hear or see things that are not real (hallucinations). You may believe falsely that others are trying to hurt you (paranoia).  DIAGNOSIS  Mild neurocognitive disorder is diagnosed through an assessment by your health care provider. Your health care provider will ask you and your family, friends, or coworkers questions about your symptoms. He or she will ask how often the symptoms occur, how long they have been occurring, whether they are getting worse, and the effect they are having on your life. Your health care provider may refer you to a neurologist or mental health specialist for a detailed evaluation of your mental functions (neuropsychological testing).   To identify the cause of your mild neurocognitive disorder, your health care provider may:  · Obtain a detailed medical history.  · Ask about alcohol and drug use, including prescription medicine.  · Perform a physical exam.  · Order blood tests and brain imaging exams.  TREATMENT   Mild neurocognitive disorder caused by infections, use of certain medicines or "recreational" drugs, and certain medical conditions may improve with treatment of the condition that is causing the disorder.  Mild neurocognitive disorder resulting from other causes generally does not improve and may worsen. In these cases, the goal of treatment is to slow progression of the disorder and help you cope with the loss of mental function. Treatments in these cases   include:   · Medicine. Medicine helps mainly with memory loss and behavioral symptoms.    · Talk therapy. Talk therapy provides education, emotional support, memory aids, and other ways of  making up for decreases in mental function.       · Lifestyle changes. These include regular exercise, a healthy diet (including essential omega-3 fatty acids), intellectual stimulation, and increased social interaction.  Document Released: 01/23/2013 Document Revised: 10/07/2013 Document Reviewed: 01/23/2013  ExitCare® Patient Information ©2015 [employer]. This information is not intended to replace advice given to you by your health care provider. Make sure you discuss any questions you have with your health care provider.

## 2014-09-09 NOTE — Assessment & Plan Note (Signed)
Occasional Ok to take Aleve or Ibuprofen as needed
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
## 2014-09-09 NOTE — Progress Notes (Signed)
Pre visit review using our clinic review tool, if applicable. No additional management support is needed unless otherwise documented below in the visit note. 

## 2014-09-09 NOTE — Assessment & Plan Note (Signed)
He seems to be doing okay on the amiodarone BP is on the lower side He does have an upcoming appt with Dr. Graciela Husbands

## 2014-09-09 NOTE — Assessment & Plan Note (Signed)
Chronic but stable Recent head CT normal No further workup of intervention at this time

## 2014-09-11 ENCOUNTER — Telehealth: Payer: Self-pay | Admitting: Internal Medicine

## 2014-09-11 ENCOUNTER — Emergency Department (HOSPITAL_COMMUNITY): Payer: Medicare Other

## 2014-09-11 ENCOUNTER — Encounter (HOSPITAL_COMMUNITY): Payer: Self-pay | Admitting: Emergency Medicine

## 2014-09-11 ENCOUNTER — Emergency Department (HOSPITAL_COMMUNITY)
Admission: EM | Admit: 2014-09-11 | Discharge: 2014-09-11 | Disposition: A | Discharge status: Home / Self Care | Payer: Medicare Other | Attending: Emergency Medicine | Admitting: Emergency Medicine

## 2014-09-11 DIAGNOSIS — Z79899 Other long term (current) drug therapy: Secondary | ICD-10-CM | POA: Insufficient documentation

## 2014-09-11 DIAGNOSIS — M159 Polyosteoarthritis, unspecified: Secondary | ICD-10-CM | POA: Diagnosis not present

## 2014-09-11 DIAGNOSIS — I4891 Unspecified atrial fibrillation: Secondary | ICD-10-CM | POA: Diagnosis not present

## 2014-09-11 DIAGNOSIS — Z86018 Personal history of other benign neoplasm: Secondary | ICD-10-CM | POA: Insufficient documentation

## 2014-09-11 DIAGNOSIS — Z87891 Personal history of nicotine dependence: Secondary | ICD-10-CM | POA: Insufficient documentation

## 2014-09-11 DIAGNOSIS — R062 Wheezing: Secondary | ICD-10-CM | POA: Insufficient documentation

## 2014-09-11 DIAGNOSIS — R05 Cough: Secondary | ICD-10-CM | POA: Insufficient documentation

## 2014-09-11 DIAGNOSIS — R2689 Other abnormalities of gait and mobility: Secondary | ICD-10-CM | POA: Diagnosis not present

## 2014-09-11 DIAGNOSIS — Z8719 Personal history of other diseases of the digestive system: Secondary | ICD-10-CM | POA: Insufficient documentation

## 2014-09-11 DIAGNOSIS — R531 Weakness: Secondary | ICD-10-CM

## 2014-09-11 DIAGNOSIS — I10 Essential (primary) hypertension: Secondary | ICD-10-CM | POA: Insufficient documentation

## 2014-09-11 DIAGNOSIS — R410 Disorientation, unspecified: Secondary | ICD-10-CM | POA: Diagnosis not present

## 2014-09-11 DIAGNOSIS — Z8619 Personal history of other infectious and parasitic diseases: Secondary | ICD-10-CM | POA: Insufficient documentation

## 2014-09-11 DIAGNOSIS — J449 Chronic obstructive pulmonary disease, unspecified: Secondary | ICD-10-CM | POA: Diagnosis not present

## 2014-09-11 DIAGNOSIS — J45909 Unspecified asthma, uncomplicated: Secondary | ICD-10-CM | POA: Diagnosis not present

## 2014-09-11 DIAGNOSIS — M6281 Muscle weakness (generalized): Secondary | ICD-10-CM | POA: Insufficient documentation

## 2014-09-11 DIAGNOSIS — J439 Emphysema, unspecified: Secondary | ICD-10-CM | POA: Diagnosis not present

## 2014-09-11 LAB — CBC
HCT: 38 % — ABNORMAL LOW (ref 39.0–52.0)
HEMOGLOBIN: 12.6 g/dL — AB (ref 13.0–17.0)
MCH: 30.9 pg (ref 26.0–34.0)
MCHC: 33.2 g/dL (ref 30.0–36.0)
MCV: 93.1 fL (ref 78.0–100.0)
PLATELETS: 181 10*3/uL (ref 150–400)
RBC: 4.08 MIL/uL — AB (ref 4.22–5.81)
RDW: 14.5 % (ref 11.5–15.5)
WBC: 9.3 10*3/uL (ref 4.0–10.5)

## 2014-09-11 LAB — URINALYSIS, ROUTINE W REFLEX MICROSCOPIC
Glucose, UA: NEGATIVE mg/dL
HGB URINE DIPSTICK: NEGATIVE
Ketones, ur: NEGATIVE mg/dL
LEUKOCYTES UA: NEGATIVE
NITRITE: NEGATIVE
PH: 5.5 (ref 5.0–8.0)
Protein, ur: NEGATIVE mg/dL
SPECIFIC GRAVITY, URINE: 1.018 (ref 1.005–1.030)
UROBILINOGEN UA: 1 mg/dL (ref 0.0–1.0)

## 2014-09-11 LAB — BASIC METABOLIC PANEL
ANION GAP: 8 (ref 5–15)
BUN: 16 mg/dL (ref 6–23)
CALCIUM: 8.3 mg/dL — AB (ref 8.4–10.5)
CO2: 22 mmol/L (ref 19–32)
Chloride: 105 mmol/L (ref 96–112)
Creatinine, Ser: 0.93 mg/dL (ref 0.50–1.35)
GFR, EST AFRICAN AMERICAN: 86 mL/min — AB (ref >90)
GFR, EST NON AFRICAN AMERICAN: 74 mL/min — AB (ref >90)
Glucose, Bld: 102 mg/dL — ABNORMAL HIGH (ref 70–99)
Potassium: 4.4 mmol/L (ref 3.5–5.1)
SODIUM: 135 mmol/L (ref 135–145)

## 2014-09-11 LAB — I-STAT TROPONIN, ED: TROPONIN I, POC: 0 ng/mL (ref 0.00–0.08)

## 2014-09-11 LAB — BRAIN NATRIURETIC PEPTIDE: B NATRIURETIC PEPTIDE 5: 77.1 pg/mL (ref 0.0–100.0)

## 2014-09-11 NOTE — Discharge Instructions (Signed)

## 2014-09-11 NOTE — ED Notes (Signed)
Patient transported to X-ray 

## 2014-09-11 NOTE — ED Notes (Signed)
Pt. Ambulated with walker. O2 Sats 92% RA.

## 2014-09-11 NOTE — ED Notes (Signed)
Per pt wife pt was instructed by home rehab staff to come to ED to be evaluated- home health found pt to have irregular HR at home.  Pt admits to feeling weak for the past 3 months that has continued to get worse.  Denies pain at present.
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
## 2014-09-11 NOTE — Telephone Encounter (Signed)
ED appropriate.

## 2014-09-18 ENCOUNTER — Ambulatory Visit (INDEPENDENT_AMBULATORY_CARE_PROVIDER_SITE_OTHER): Payer: Medicare Other | Admitting: Family Medicine

## 2014-09-18 ENCOUNTER — Telehealth: Payer: Self-pay

## 2014-09-18 VITALS — BP 130/70 | HR 62 | Temp 98.1°F | Resp 18 | Ht 74.0 in | Wt 175.0 lb

## 2014-09-18 DIAGNOSIS — R52 Pain, unspecified: Secondary | ICD-10-CM

## 2014-09-18 DIAGNOSIS — R200 Anesthesia of skin: Secondary | ICD-10-CM

## 2014-09-18 DIAGNOSIS — R208 Other disturbances of skin sensation: Secondary | ICD-10-CM

## 2014-09-18 NOTE — Patient Instructions (Signed)
It was good to meet you today.  I'm not sure about your aches.  I think you should continue Tylenol as you have been.  The wrist numbness is likely from using your walker.  Make sure your hands rest if they become numb.  It was good to meet you today.  Make sure you follow up with your primary care doctor in the next few days.    Take care

## 2014-09-18 NOTE — Telephone Encounter (Signed)
Mrs [name] left v/m; pt was seen 09/09/14; pt was unable to do Physical therapy on 09/17/14 due to pt not feeling like he could do any workout; pt having numbness in hand, h/a and nausea. Mrs [name] request cb.

## 2014-09-18 NOTE — Progress Notes (Signed)
[name] is a 81 y.o. male who presents to Urgent Care today with complaints of wrist numbness and aches:  1.  Generalized body aches:  Present for years.  Describes aches that start in his Left foot, travel to Left arm, "cross over" to his left foot and leg and travel to Left hand.  Occasional headaches.  No changes in vision.  Headaches every 1-2 weeks, lasts several hours, resolves with Tylenol.  Denies any weakness except for Right foot drop which has been longstanding problem for him.  Aches last for a few minutes and then spontaneously resolve.  No fevers or chills.    Had recent visit to ED for "delirium" resolved with oxygen supplementaition.  Wife and caregiver both present with him today, all three deny any confusion or changes in mental status since ED visit.  They state he is at his baseline.  2.  Wrist numbness:  Occurs occasionally throughout the day.  Resolves with rest.  Notices after walking.  Uses walker for ambulation and has done so ever since diagnosed with nerve palsy.  Denies any chest pain, dyspnea, palpitations, abdomina pain, LE edema.    PMH reviewed.  Past Medical History  Diagnosis Date  . Arthritis   . Asthma   . Chicken pox   . Diverticulitis   . Emphysema of lung   . Frequent headaches   . Allergy   . Hypertension   . Arrhythmia   . History of colon polyps    Past Surgical History  Procedure Laterality Date  . Appendectomy    . Tonsillectomy      Medications reviewed. Current Outpatient Prescriptions  Medication Sig Dispense Refill  . acetaminophen (TYLENOL) 325 MG tablet Take 650 mg by mouth every 6 (six) hours as needed for moderate pain.     Marland Kitchen amiodarone (PACERONE) 200 MG tablet Take 200 mg by mouth daily. Takes at 2pm daily    . Melatonin 3 MG TABS Take 6 mg by mouth at bedtime.    Marland Kitchen OLANZapine zydis (ZYPREXA) 10 MG disintegrating tablet Take 5 mg by mouth at bedtime.     . tamsulosin (FLOMAX) 0.4 MG CAPS capsule Take 0.4 mg by mouth.     . tiotropium (SPIRIVA) 18 MCG inhalation capsule Place 18 mcg into inhaler and inhale daily.     No current facility-administered medications for this visit.    ROS as above otherwise neg.  No chest pain, palpitations, SOB, Fever, Chills, Abd pain, N/V/D.   Physical Exam:  BP 130/70 mmHg  Pulse 62  Temp(Src) 98.1 F (36.7 C) (Oral)  Resp 18  Ht 6\' 2"  (1.88 m)  Wt 175 lb (79.379 kg)  BMI 22.46 kg/m2  SpO2 94% Gen:  Alert, cooperative patient who appears stated age in no acute distress.  Vital signs reviewed. HEENT:  [place]/AT.  EOMI, PERRL.  MMM, tonsils non-erythematous, non-edematous.  External ears WNL, Bilateral TM's normal without retraction, redness or bulging.  Neck:  Supple.   Pulm:  Clear to auscultation bilaterally with good air movement.  No wheezes or rales noted.   Cardiac:  Regular rate and rhythm without murmur auscultated.  Good S1/S2. Abd:  Soft/nondistended/nontender.  .  Exts: Non edematous BL  LE, warm and well perfused.  Neuro:  CN II - XII intact.  Strength is 5/5 BL UE's.  Handgrip 5/5.  LE strength is 4/5 BL and symmetric, except 2/5 Right foot dorsiflexion.  Sensation is 5/5 and intact throughout all extremities.  Alert and oriented x 4. Skin:  No rashes noted.  Some senile purpura Bl UE's   Assessment and Plan:  1.  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  Continue this  2.  Generalized aches: - unclear etiology.  Longstanding in nature (present >3-4 years) - short-lived, no red flags - Plan FU with PCP if no improvement.   - no evidence of confusion.  3.  Right foot drop: - known problem, reason he uses walker - stable.

## 2014-09-19 ENCOUNTER — Telehealth: Payer: Self-pay

## 2014-09-19 NOTE — Telephone Encounter (Signed)
Pt went to UC.
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
## 2014-09-19 NOTE — Telephone Encounter (Signed)
Pt saw Dr Gwendolyn Grant [facility] on 09/18/14 at 8:30 PM.

## 2014-09-24 ENCOUNTER — Ambulatory Visit: Payer: Self-pay | Admitting: Podiatrist

## 2014-09-26 ENCOUNTER — Ambulatory Visit (INDEPENDENT_AMBULATORY_CARE_PROVIDER_SITE_OTHER): Payer: Medicare Other | Admitting: Podiatrist

## 2014-09-26 ENCOUNTER — Encounter: Payer: Self-pay | Admitting: Podiatrist

## 2014-09-26 VITALS — BP 106/64 | HR 79 | Temp 96.7°F | Resp 14

## 2014-09-26 DIAGNOSIS — B351 Tinea unguium: Secondary | ICD-10-CM | POA: Diagnosis not present

## 2014-09-26 DIAGNOSIS — M79676 Pain in unspecified toe(s): Secondary | ICD-10-CM

## 2014-09-26 NOTE — Patient Instructions (Signed)
i recommend the [employer] for a good pair of accomidative shoes.
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
## 2014-09-27 NOTE — Discharge Summary (Signed)
PATIENT NAMEPRENTIS, [name] MR#:  [id number] DATE OF BIRTH:  1925-09-04  DATE OF ADMISSION:  10/21/2013 DATE OF DISCHARGE:  10/22/2013   PRESENTING COMPLAINT:   Weakness.   DISCHARGE DIAGNOSES: 1.  Symptomatic bradycardia, resolved, improved, secondary to propranolol.  2.  Chronic obstructive pulmonary disease with tobacco abuse.  3.  Hypertension.   Heart rate at discharge 70 to 89.   CODE STATUS: FULL CODE.   MEDICATIONS: 1.  Lisinopril 10 mg daily.  2.  DuoNeb 3 mL 4 times a day as needed.  3.  Spiriva 18 mcg inhalation daily.  4.  Xanax 0.5 mg b.i.d. as needed.   The patient advised to stop taking propranolol.   Follow up with Dr. Adrian Marks in 1 to 2 weeks.  Use your nebulizers as before.   LABORATORY AND IMAGING DATA: At discharge, CBC within normal limits. Basic metabolic panel and cardiac panel within normal limits.   Echo of the heart shows EF of 55% to 60%. Left impaired relaxation pattern of LV diastolic filling. Normal right ventricular systolic function and size.  Left atrium and right atrium are normal in size and structure. Moderately increased left ventricular posterior wall thickness.   Cardiology consultation by consultation with Dr. Adrian Marks.   BRIEF SUMMARY OF HOSPITAL COURSE: [name] is an 81 year old Caucasian gentleman with history of emphysema and ongoing tobacco abuse, comes in with generalized weakness. He was found to have:  1. Symptomatic bradycardia, which is likely due to his beta blockers. Propranolol was discontinued. He remained in sinus rhythm thereafter. Dr. Welton Marks saw the  patient, and since the patient was asymptomatic thereafter, it was okay to go home from cardiology standpoint.  2. Chronic obstructive pulmonary disease. No wheezing. Continued Spiriva nebulizer had been set up.  3.  Hypertension. Lisinopril was started.  4.  Anxiety. Continue p.r.n. Xanax.  5.  Essential tremors for which the patient was taking propranolol. The  patient will discuss with his primary care physician and may need to go on a different medication.   Hospital stay otherwise remained stable. The patient remained a FULL CODE.   TIME SPENT:  40 minutes.   ____________________________ Kenneth Hail Kenneth Katz, Marks sap:dmm D: 10/24/2013 15:30:31 ET T: 10/24/2013 18:33:20 ET JOB#: [id number]  cc: Kenneth Parkey A. Kenneth Katz, Marks, <Dictator> [name] ELECTRONICALLY SIGNED 10/27/2013 16:22

## 2014-09-27 NOTE — Consult Note (Signed)
PATIENT NAMEHANNON, [name] MR#:  [id number] DATE OF BIRTH:  [birth year]  DATE OF CONSULTATION:  10/21/2013  CONSULTING PHYSICIAN:  Laurier Nancy, MD  INDICATION FOR CONSULTATION: Atrial fibrillation with slow ventricular response rate.  HISTORY OF PRESENT ILLNESS: This is an 81 year old white male with a past medical history of essential tremors, was placed on propranolol by his primary care doctor, presented to the Emergency Room with lethargy, dizziness, and shortness of breath. No chest pain. He was found on EKG to have atrial fibrillation with heart rate 40 and low voltage. I was asked to evaluate the patient because he was lethargic. While he is lying on bed, he denies any symptoms.   PAST MEDICAL HISTORY: No history of hypertension, diabetes, hyperlipidemia.   SOCIAL HISTORY: No history of EtOH abuse. He used to smoke, quit recently.   FAMILY HISTORY: Positive for coronary artery disease.   PHYSICAL EXAMINATION: VITAL SIGNS: His heart rate is 40; on the monitor, it shows atrial fibrillation. Respiration 18. He is afebrile. Blood pressure is normal.  NECK: No JVD.  LUNGS: Clear.  HEART: Irregularly irregular pulse. Normal S1, S2. No audible murmur.  ABDOMEN: Soft, nontender, positive bowel sounds.  EXTREMITIES: No pedal edema.   EKG shows A. 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   ASSESSMENT AND PLAN: The patient has atrial fibrillation with slow ventricular response rate with history of taking propranolol. Advise holding propranolol. May give some other medications for tremor. Will do echocardiogram and look at his ejection fraction and make further recommendations.   Thank you very much for the referral.    ____________________________ Laurier Nancy, MD sak:jcm D: 10/21/2013 17:07:24 ET T: 10/21/2013 18:14:33 ET JOB#: [id number]  cc: Laurier Nancy, MD, <Dictator> Laurier Nancy  MD ELECTRONICALLY SIGNED 10/30/2013 13:59

## 2014-09-27 NOTE — H&P (Signed)
PATIENT NAMETYKEE, [name] MR#:  [id number] DATE OF BIRTH:  [birth year]  DATE OF ADMISSION:  10/21/2013  PRIMARY CARE PHYSICIAN: From [facility]  EMERGENCY ROOM PHYSICIAN: Daryel November, MD  CHIEF COMPLAINT: Weakness.   REASON FOR ADMISSION: The patient is an 81 year old male brought in by the wife because of generalized weakness and also bradycardia.   HISTORY OF PRESENT ILLNESS: This is an 81 year old male with history of COPD and anxiety and essential tremors who comes in because of generalized weakness and bradycardia. According to the wife, the patient's heart rate was as low as 30s and because of that she brought him here. The patient has been on propranolol 40 mg p.o. b.i.d. for essential tremors for a long time. Recently, 3 months ago, the patient's PMD told him to check the heart rate and see if the heart rate goes down and he needs to go to the ER. The patient's wife checked the heart rate for him today and found that he was having heart rate as low as 30s. The patient also had generalized weakness. The patient denies any dizziness, did not have any syncope. The patient took propranolol 40 mg last night and also this morning. Denies any chest pain or trouble breathing.   PAST MEDICAL HISTORY: Significant for COPD, anxiety and also essential tremors.   ALLERGIES: No known allergies.   SOCIAL HISTORY: Smokes about 2 cigars a day. He was a heavy smoker before, but now he is in the process of cutting down. No alcohol. No drugs.   PAST SURGICAL HISTORY: Back surgery.  MEDICATIONS: Xanax 0.5 mg p.o. b.i.d. p.r.n., Spiriva 1 capsule inhalation daily, propranolol 40 mg p.o. b.i.d.   PAST SURGICAL HISTORY: Significant for back surgery a long time back.   FAMILY HISTORY: No hypertension or diabetes.   REVIEW OF SYSTEMS:  CONSTITUTIONAL: No fever. No fatigue. Generalized weakness present.  EYES: No blurred vision.  ENT: No tinnitus. No ear pain. No epistaxis. No difficulty swallowing.   RESPIRATORY: No cough. No wheezing.  CARDIOVASCULAR: No chest pain. No orthopnea or pedal edema.  GASTROINTESTINAL: No nausea. No vomiting. No abdominal pain.  GENITOURINARY: No dysuria or hematuria.  ENDOCRINE: No polyuria or nocturia.  HEMATOLOGIC: No anemia, easy bruising.  INTEGUMENTARY: The patient has no skin rashes.  MUSCULOSKELETAL: Complains of low back pain and also history of difficulty with ambulation because of back pain and unstable gait recently.  NEUROLOGIC: No numbness. No weakness. The patient has history of essential tremors and taking propranolol for that.  PSYCHIATRIC: The patient does have anxiety and takes Xanax for that.   PHYSICAL EXAMINATION:  VITAL SIGNS: Temperature 97 Fahrenheit, heart rate as low as 30s during my visit, and the patient is having bradycardia. The patient's blood pressure is 126/60. GENERAL: The patient is alert, awake and oriented. Well-developed, well-nourished male not in acute distress. Answers questions appropriately.  HEAD: Atraumatic, normocephalic.  EYES: Pupils equal and reacting to light. Extraocular movements intact.  ENT: No tympanic membrane congestion. No turbinate hypertrophy. No oropharyngeal erythema.  NECK: Supple. No JVD. No carotid bruit.  HEART: S1 and S2 regular. No murmurs. The patient's PMI is nondisplaced. Peripheral pulses are intact in carotid artery, dorsalis pedis, and also femoral artery. LUNGS: Clear to auscultation. No wheeze noted. The patient is not using accessory muscles of respiration.  GASTROINTESTINAL: Abdomen is soft, nontender, nondistended. Bowel sounds present. No organomegaly. No CVA tenderness. No hernias.  EXTREMITIES: No extremity edema. No cyanosis or clubbing.  LYMPHATICS: No lymphadenopathy in  cervical or axillary region.  SKIN: Warm and moist. No skin ulcers.  NEUROLOGIC: Alert, awake and oriented. Cranial nerves II through XII intact. Power 5/5 in upper and lower extremities. Sensation intact.  DTRs are 2+ bilaterally.  PSYCHIATRIC: Mood and affect are within normal limits.  DIAGNOSTIC DATA: INR is 1. CK total 46. Electrolytes: Sodium 136, potassium 4.4, chloride 103, bicarb 30, BUN 14, creatinine 0.93, glucose 106.   WBC 7.7, hemoglobin 14.5, hematocrit 42.3, platelets 169,000.   Chest x-ray shows no evidence of superimposed pneumononia,. changes of  COPD.  present.  EKG shows atrial fibrillation with slow RVR and 40 beats per minute.   ASSESSMENT AND PLAN: An 81 year old male with symptomatic bradycardia. The patient's bradycardia is likely secondary to beta blockers. Admit him to telemetry under observation status and hold the beta blockers, monitor him on telemetry for at least 24 hours to see if the bradycardia reverses without any intervention. Obtain cardiology consult because of bradycardia. 1.  Chronic obstructive pulmonary disease. No wheezing. Continue Spiriva. 2.  Anxiety. Continue home dose of Xanax.  3.  The patient has tobacco abuse. Counseled on smoking cessation. The patient is already in the process of cutting it down.  4.  Bradycardia, related to beta blockers. Hopefully bradycardia should improve without beta blocker. Stop the beta blockers. Monitor him on telemetry. I discussed this plan with the patient's wife.  5.  Regarding his essential tremors, the patient may need to go on a different medication for the tremors of the hands, other than beta blockers. Obtain cardiology consult. Obtain echocardiogram.   TIME SPENT: 55 minutes.  ____________________________ Katha Hamming, MD sk:sb D: 10/21/2013 15:22:00 ET T: 10/21/2013 16:13:26 ET JOB#: [id number]  cc: Katha Hamming, MD, <Dictator> Katha Hamming MD ELECTRONICALLY SIGNED 10/30/2013 21:13

## 2014-09-29 DIAGNOSIS — R410 Disorientation, unspecified: Secondary | ICD-10-CM | POA: Diagnosis not present

## 2014-09-29 DIAGNOSIS — J449 Chronic obstructive pulmonary disease, unspecified: Secondary | ICD-10-CM | POA: Diagnosis not present

## 2014-09-29 DIAGNOSIS — I4891 Unspecified atrial fibrillation: Secondary | ICD-10-CM | POA: Diagnosis not present

## 2014-09-29 DIAGNOSIS — M6281 Muscle weakness (generalized): Secondary | ICD-10-CM | POA: Diagnosis not present

## 2014-09-29 DIAGNOSIS — M159 Polyosteoarthritis, unspecified: Secondary | ICD-10-CM | POA: Diagnosis not present

## 2014-09-29 DIAGNOSIS — R2689 Other abnormalities of gait and mobility: Secondary | ICD-10-CM | POA: Diagnosis not present

## 2014-10-01 ENCOUNTER — Other Ambulatory Visit: Payer: Self-pay | Admitting: *Deleted

## 2014-10-01 MED ORDER — TAMSULOSIN HCL 0.4 MG PO CAPS: 0.4000 mg | ORAL_CAPSULE | Freq: Every day | ORAL | Status: DC

## 2014-10-01 MED ORDER — AMIODARONE HCL 200 MG PO TABS: 200.0000 mg | ORAL_TABLET | Freq: Every day | ORAL | Status: DC

## 2014-10-01 NOTE — Telephone Encounter (Signed)
OV 09/09/14, last filled 09/02/14

## 2014-10-07 ENCOUNTER — Ambulatory Visit: Payer: Self-pay | Admitting: Internal Medicine

## 2014-10-15 ENCOUNTER — Ambulatory Visit: Payer: Medicare Other | Admitting: Podiatrist

## 2014-10-15 ENCOUNTER — Telehealth: Payer: Self-pay

## 2014-10-15 NOTE — Telephone Encounter (Signed)
Mrs Running left v/m; pt has been taking tamsulosin and pt thinks tamsulosin is causing pt to have frequency of urine; pt getting up during night q 1 - 1 1/2 hours to urinate. Pt wants to stop tamsulosin and Mrs Vanvooren request cb.

## 2014-10-15 NOTE — Telephone Encounter (Signed)
Spoke to [employer] and she states pt is sure that the medication is causing frequent urination---as advice i told mrs Seidel it may help for him to not have too much to drink right before bed or try to delay some time before bed as he may urinate before bed and may help decrease frequency as well--please advise if there is anything more you suggest

## 2014-10-15 NOTE — Telephone Encounter (Signed)
This medication is supposed to help with night time urination, not make it worse. I recommend that he keep taking it. At his age, he is expected to get up a few times per night to urinate.

## 2014-10-16 NOTE — Telephone Encounter (Signed)
If she is concerned, he can stop it. Or I can refer them to a urologist, but she will need to make a follow up appt for me to place the referral

## 2014-10-16 NOTE — Telephone Encounter (Signed)
Spoke with wife and she will question the patient again to see if this is needed. Wife states pt gets up much more at night. Wife made appt for Monday 10/20/14

## 2014-10-17 DIAGNOSIS — M159 Polyosteoarthritis, unspecified: Secondary | ICD-10-CM | POA: Diagnosis not present

## 2014-10-17 DIAGNOSIS — R2689 Other abnormalities of gait and mobility: Secondary | ICD-10-CM | POA: Diagnosis not present

## 2014-10-17 DIAGNOSIS — J449 Chronic obstructive pulmonary disease, unspecified: Secondary | ICD-10-CM | POA: Diagnosis not present

## 2014-10-17 DIAGNOSIS — I4891 Unspecified atrial fibrillation: Secondary | ICD-10-CM | POA: Diagnosis not present

## 2014-10-17 DIAGNOSIS — R410 Disorientation, unspecified: Secondary | ICD-10-CM | POA: Diagnosis not present

## 2014-10-17 DIAGNOSIS — M6281 Muscle weakness (generalized): Secondary | ICD-10-CM | POA: Diagnosis not present

## 2014-10-20 ENCOUNTER — Ambulatory Visit (INDEPENDENT_AMBULATORY_CARE_PROVIDER_SITE_OTHER): Payer: Medicare Other | Admitting: Internal Medicine

## 2014-10-20 ENCOUNTER — Encounter: Payer: Self-pay | Admitting: Internal Medicine

## 2014-10-20 VITALS — BP 142/70 | HR 76 | Temp 97.6°F | Wt 187.0 lb

## 2014-10-20 DIAGNOSIS — N400 Enlarged prostate without lower urinary tract symptoms: Secondary | ICD-10-CM | POA: Diagnosis not present

## 2014-10-20 NOTE — Patient Instructions (Signed)
Benign Prostatic Hypertrophy The prostate gland is part of the reproductive system of men. A normal prostate is about the size and shape of a walnut. The prostate gland produces a fluid that is mixed with sperm to make semen. This gland surrounds the urethra and is located in front of the rectum and just below the bladder. The bladder is where urine is stored. The urethra is the tube through which urine passes from the bladder to get out of the body. The prostate grows as a man ages. An enlarged prostate not caused by cancer is called benign prostatic hypertrophy (BPH). An enlarged prostate can press on the urethra. This can make it harder to pass urine. In the early stages of enlargement, the bladder can get by with a narrowed urethra by forcing the urine through. If the problem gets worse, medical or surgical treatment may be required.  This condition should be followed by your health care provider. The accumulation of urine in the bladder can cause infection. Back pressure and infection can progress to bladder damage and kidney (renal) failure. If needed, your health care provider may refer you to a specialist in kidney and prostate disease (urologist). CAUSES  BPH is a common health problem in men older than 50 years. This condition is a normal part of aging. However, not all men will develop problems from this condition. If the enlargement grows away from the urethra, then there will not be any compression of the urethra and resistance to urine flow.If the growth is toward the urethra and compresses it, you will experience difficulty urinating.  SYMPTOMS   Not able to completely empty your bladder.  Getting up often during the night to urinate.  Need to urinate frequently during the day.  Difficultly starting urine flow.  Decrease in size and strength of your urine stream.  Dribbling after urination.  Pain on urination (more common with infection).  Inability to pass urine. This needs  immediate treatment.  The development of a urinary tract infection. DIAGNOSIS  These tests will help your health care provider understand your problem:  A thorough history and physical examination.  A urination history, with the number of times you urinate, the amounts of urine, the strength of the urine stream, and the feeling of emptiness or fullness after urinating.  A postvoid bladder scan that measures any amount of urine that may remain in your bladder after you finish urinating.  Digital rectal exam. In a rectal exam, your health care provider checks your prostate by putting a gloved, lubricated finger into your rectum to feel the back of your prostate gland. This exam detects the size of your gland and abnormal lumps or growths.  Exam of your urine (urinalysis).  Prostate specific antigen (PSA) screening. This is a blood test used to screen for prostate cancer.  Rectal ultrasonography. This test uses sound waves to electronically produce a picture of your prostate gland. TREATMENT  Once symptoms begin, your health care provider will monitor your condition. Of the men with this condition, one third will have symptoms that stabilize, one third will have symptoms that improve, and one third will have symptoms that progress in the first year. Mild symptoms may not need treatment. Simple observation and yearly exams may be all that is required. Medicines and surgery are options for more severe problems. Your health care provider can help you make an informed decision for what is best. Two classes of medicines are available for relief of prostate symptoms:  Medicines   that shrink the prostate. This helps relieve symptoms. These medicines take time to work, and it may be months before any improvement is seen.  Uncommon side effects include problems with sexual function.  Medicines to relax the muscle of the prostate. This also relieves the obstruction by reducing any compression on the  urethra.This group of medicines work much faster than those that reduce the size of the prostate gland. Usually, one can experience improvement in days to weeks..  Side effects can include dizziness, fatigue, lightheadedness, and retrograde ejaculation (diminished volume of ejaculate). Several types of surgical treatments are available for relief of prostate symptoms:  Transurethral resection of the prostate (TURP)--In this treatment, an instrument is inserted through opening at the tip of the penis. It is used to cut away pieces of the inner core of the prostate. The pieces are removed through the same opening of the penis. This removes the obstruction and helps get rid of the symptoms.  Transurethral incision (TUIP)--In this procedure, small cuts are made in the prostate. This lessens the prostates pressure on the urethra.  Transurethral microwave thermotherapy (TUMT)--This procedure uses microwaves to create heat. The heat destroys and removes a small amount of prostate tissue.  Transurethral needle ablation (TUNA)--This is a procedure that uses radio frequencies to do the same as TUMT.  Interstitial laser coagulation (ILC)--This is a procedure that uses a laser to do the same as TUMT and TUNA.  Transurethral electrovaporization (TUVP)--This is a procedure that uses electrodes to do the same as the procedures listed above. SEEK MEDICAL CARE IF:   You develop a fever.  There is unexplained back pain.  Symptoms are not helped by medicines prescribed.  You develop side effects from the medicine you are taking.  Your urine becomes very dark or has a bad smell.  Your lower abdomen becomes distended and you have difficulty passing your urine. SEEK IMMEDIATE MEDICAL CARE IF:   You are suddenly unable to urinate. This is an emergency. You should be seen immediately.  There are large amounts of blood or clots in the urine.  Your urinary problems become unmanageable.  You develop  lightheadedness, severe dizziness, or you feel faint.  You develop moderate to severe low back or flank pain.  You develop chills or fever. Document Released: 05/23/2005 Document Revised: 05/28/2013 Document Reviewed: 12/06/2012 Exit[employer] Patient Information 2015 [employer]. This information is not intended to replace advice given to you by your health care provider. Make sure you discuss any questions you have with your health care provider.  

## 2014-10-20 NOTE — Progress Notes (Signed)
Pre visit review using our clinic review tool, if applicable. No additional management support is needed unless otherwise documented below in the visit note. 

## 2014-10-20 NOTE — Assessment & Plan Note (Signed)
Discussed avoiding fluids after 7pm I advised him to continue the Flomax at this time Offered a referral to urology for further evaluation, he declines He was unable to void for a urine sample for urinalysis  RTC as needed or if symptoms persist or worsen

## 2014-10-20 NOTE — Progress Notes (Signed)
Subjective:    Patient ID: [name], male    DOB: 07-13-1925, 81 y.o.   MRN: [id number]  HPI  Pt presents to the clinic today to discuss urinary frequency and nocturia. He reports he has to get up 1/2 times per night to urinate. This is not every night, but occurs intermittently. He is on Flomax for BPH bu the thinks the Flomax is what is causing his symptoms. He wants to stop the medication but has continued taking is so far. He reports he drinks about 8 oz of water per night before bed. He denies dysuria or blood in his urine.  Review of Systems      Past Medical History  Diagnosis Date  . Arthritis   . Asthma   . Chicken pox   . Diverticulitis   . Emphysema of lung   . Frequent headaches   . Allergy   . Hypertension   . Arrhythmia   . History of colon polyps     Current Outpatient Prescriptions  Medication Sig Dispense Refill  . acetaminophen (TYLENOL) 325 MG tablet Take 650 mg by mouth every 6 (six) hours as needed for moderate pain.     Marland Kitchen amiodarone (PACERONE) 200 MG tablet Take 1 tablet (200 mg total) by mouth daily. Takes at 2pm daily 30 tablet 2  . Melatonin 3 MG TABS Take 6 mg by mouth at bedtime. As needed    . tamsulosin (FLOMAX) 0.4 MG CAPS capsule Take 1 capsule (0.4 mg total) by mouth daily. 30 capsule 2  . tiotropium (SPIRIVA) 18 MCG inhalation capsule Place 18 mcg into inhaler and inhale daily.     No current facility-administered medications for this visit.    No Known Allergies  Family History  Problem Relation Age of Onset  . Arthritis Mother   . Hypertension Mother   . Hyperlipidemia Father   . Hypertension Father   . Alcohol abuse Brother   . Cancer Brother     Lung    History   Social History  . Marital Status: Married    Spouse Name: N/A  . Number of Children: N/A  . Years of Education: N/A   Occupational History  . Not on file.   Social History Main Topics  . Smoking status: Former Smoker -- 0.25 packs/day for 60 years      Types: Cigarettes  . Smokeless tobacco: Former Neurosurgeon     Comment: quit x 1 month  . Alcohol Use: No  . Drug Use: No  . Sexual Activity: Not Currently   Other Topics Concern  . Not on file   Social History Narrative     Constitutional: Denies fever, malaise, fatigue, headache or abrupt weight changes.  Gastrointestinal: Denies abdominal pain, bloating, constipation, diarrhea or blood in the stool.  GU: Pt reports urinary frequency and nocturia. Denies urgency, pain with urination, burning sensation, blood in urine, odor or discharge.  No other specific complaints in a complete review of systems (except as listed in HPI above).  Objective:   Physical Exam   BP 142/70 mmHg  Pulse 76  Temp(Src) 97.6 F (36.4 C) (Oral)  Wt 187 lb (84.823 kg)  SpO2 96% Wt Readings from Last 3 Encounters:  10/20/14 187 lb (84.823 kg)  09/18/14 175 lb (79.379 kg)  09/09/14 175 lb 8 oz (79.606 kg)    General: Appears his stated age, chronically ill appearing in NAD. Abdomen: Soft and nontender. Normal bowel sounds, no bruits noted. No distention  or masses noted. Liver, spleen and kidneys non palpable. Rectal: He declines  BMET    Component Value Date/Time   NA 135 09/11/2014 1620   NA 138 10/22/2013 0506   K 4.4 09/11/2014 1620   K 4.0 10/22/2013 0506   CL 105 09/11/2014 1620   CL 104 10/22/2013 0506   CO2 22 09/11/2014 1620   CO2 30 10/22/2013 0506   GLUCOSE 102* 09/11/2014 1620   GLUCOSE 96 10/22/2013 0506   BUN 16 09/11/2014 1620   BUN 15 10/22/2013 0506   CREATININE 0.93 09/11/2014 1620   CREATININE 0.79 10/22/2013 0506   CALCIUM 8.3* 09/11/2014 1620   CALCIUM 8.9 10/22/2013 0506   GFRNONAA 74* 09/11/2014 1620   GFRNONAA >60 10/22/2013 0506   GFRAA 86* 09/11/2014 1620   GFRAA >60 10/22/2013 0506    Lipid Panel  No results found for: CHOL, TRIG, HDL, CHOLHDL, VLDL, LDLCALC  CBC    Component Value Date/Time   WBC 9.3 09/11/2014 1620   WBC 6.8 10/22/2013 0506   RBC  4.08* 09/11/2014 1620   RBC 4.42 10/22/2013 0506   HGB 12.6* 09/11/2014 1620   HGB 13.9 10/22/2013 0506   HCT 38.0* 09/11/2014 1620   HCT 40.7 10/22/2013 0506   PLT 181 09/11/2014 1620   PLT 145* 10/22/2013 0506   MCV 93.1 09/11/2014 1620   MCV 92 10/22/2013 0506   MCH 30.9 09/11/2014 1620   MCH 31.4 10/22/2013 0506   MCHC 33.2 09/11/2014 1620   MCHC 34.1 10/22/2013 0506   RDW 14.5 09/11/2014 1620   RDW 14.5 10/22/2013 0506   LYMPHSABS 2.2 10/22/2013 0506   LYMPHSABS 1.0 11/25/2007 0414   MONOABS 0.7 10/22/2013 0506   MONOABS 0.9 11/25/2007 0414   EOSABS 0.2 10/22/2013 0506   EOSABS 0.0 11/25/2007 0414   BASOSABS 0.1 10/22/2013 0506   BASOSABS 0.0 11/25/2007 0414    Hgb A1C No results found for: HGBA1C      Assessment & Plan:

## 2014-10-21 ENCOUNTER — Encounter (INDEPENDENT_AMBULATORY_CARE_PROVIDER_SITE_OTHER): Payer: Self-pay

## 2014-10-21 ENCOUNTER — Ambulatory Visit (INDEPENDENT_AMBULATORY_CARE_PROVIDER_SITE_OTHER): Payer: Medicare Other | Admitting: Internal Medicine

## 2014-10-21 ENCOUNTER — Encounter: Payer: Self-pay | Admitting: Internal Medicine

## 2014-10-21 VITALS — BP 106/62 | HR 76 | Ht 74.0 in | Wt 187.8 lb

## 2014-10-21 DIAGNOSIS — I4891 Unspecified atrial fibrillation: Secondary | ICD-10-CM

## 2014-10-21 NOTE — Patient Instructions (Signed)
Medication Instructions:  Your physician has recommended you make the following change in your medication:  1) STOP taking your Amiodarone 2) STOP taking your Tamsulosin   Labwork: None  Testing/Procedures: None  Follow-Up: Your physician recommends that you schedule a follow-up appointment in: 4 MONTHS in with Dr. Graciela Husbands   Any Other Special Instructions Will Be Listed Below (If Applicable).

## 2014-10-21 NOTE — Progress Notes (Signed)
ELECTROPHYSIOLOGY CONSULT NOTE  Patient ID: [name], MRN: [id number], DOB/AGE: [birth year] 79 y.o. Admit date: (Not on file) Date of Consult: 10/21/2014  Primary Physician: Nicki Reaper, NP Primary Cardiologist: new  Geronimo Running  Chief Complaint: atrial fibrillation   HPI [name] is a 81 y.o. male  Seen at the request of his wife because of a history of atrial fibrillation. He was seen at [facility] 5/15 in consultation for Afib with SVR;  Propranolol for tremor was discontinued and HR 40>>70s. It is not clear if he was aware of the atrial fibrillation itself. He had been admitted with weakness these records were reviewed. Anticoagulation was not initiated.  He was admitted to [facility] 2/16. These records were also reviewed. He was seen in consultation by cardiology because of adenosine sensitive SVT. Amiodarone was (re)initiated having been started at [facility] the summer before and intercurrently discontinued. The notes from [facility] described that he was uncomfortable with his tachycardia; he recalls no symptoms.  He is ambulatory with some difficulty. He does not have chest pain. He has chronic shortness of breath; he has had no peripheral edema.  His gait is unstable and he uses a walker.  He has COPD and emphysema  Echo EF 55-60% with diastolic dysfunction;  Noted to have HTN  ECG 4/16  Sinus rhythm  Past Medical History  Diagnosis Date  . Arthritis   . Asthma   . Chicken pox   . Diverticulitis   . Emphysema of lung   . Frequent headaches   . Allergy   . Hypertension   . Arrhythmia   . History of colon polyps       Surgical History:  Past Surgical History  Procedure Laterality Date  . Appendectomy    . Tonsillectomy    . Back surgery       Home Meds: Prior to Admission medications   Medication Sig Start Date End Date Taking? Authorizing Provider  acetaminophen (TYLENOL) 325 MG tablet Take 650 mg by mouth every 6 (six) hours as needed for  moderate pain.     Historical Provider, MD  amiodarone (PACERONE) 200 MG tablet Take 1 tablet (200 mg total) by mouth daily. Takes at 2pm daily 10/01/14 10/01/15  Lorre Munroe, NP  Melatonin 3 MG TABS Take 6 mg by mouth at bedtime. As needed 09/02/14   Historical Provider, MD  tamsulosin (FLOMAX) 0.4 MG CAPS capsule Take 1 capsule (0.4 mg total) by mouth daily. 10/01/14 10/01/15  Lorre Munroe, NP  tiotropium (SPIRIVA) 18 MCG inhalation capsule Place 18 mcg into inhaler and inhale daily.    Historical Provider, MD      Allergies: No Known Allergies  History   Social History  . Marital Status: Married    Spouse Name: N/A  . Number of Children: N/A  . Years of Education: N/A   Occupational History  . Not on file.   Social History Main Topics  . Smoking status: Former Smoker -- 0.25 packs/day for 60 years    Types: Cigarettes  . Smokeless tobacco: Former Neurosurgeon     Comment: quit x 1 month  . Alcohol Use: No  . Drug Use: No  . Sexual Activity: Not Currently   Other Topics Concern  . Not on file   Social History Narrative     Family History  Problem Relation Age of Onset  . Arthritis Mother   . Hypertension Mother   . Hyperlipidemia Father   .  Hypertension Father   . Alcohol abuse Brother   . Cancer Brother     Lung     ROS:  Please see the history of present illness.     All other systems reviewed and negative.    Physical Exam:   Blood pressure 106/62, pulse 76, height 6\' 2"  (1.88 m), weight 187 lb 12 oz (85.163 kg). General: Well developed, well nourished male in no acute distress. Head: Normocephalic, atraumatic, sclera non-icteric, no xanthomas, nares are without discharge. EENT: normal Lymph Nodes:  none Back: without scoliosis/kyphosis , no CVA tendersness Neck: Negative for carotid bruits. JVD not elevated. Lungs: Clear bilaterally to auscultation without wheezes, rales, or rhonchi. Breathing is unlabored. He is barrel chested  Heart: RRR with S1 S2.  2/6  systolic murmur , rubs, or gallops appreciated. Abdomen: Soft, non-tender, non-distended with normoactive bowel sounds. No hepatomegaly. No rebound/guarding. No obvious abdominal masses. Msk:  Strength and tone appear normal for age. Extremities: No clubbing or cyanosis. No  edema.  Distal pedal pulses are 2+ and equal bilaterally. Skin: Warm and Dry Neuro: Alert and oriented X 3. CN III-XII intact Grossly normal sensory and motor function . Psych:  Responds to questions appropriately with a normal affect.      Labs: Cardiac Enzymes No results for input(s): CKTOTAL, CKMB, TROPONINI in the last 72 hours. CBC Lab Results  Component Value Date   WBC 9.3 09/11/2014   HGB 12.6* 09/11/2014   HCT 38.0* 09/11/2014   MCV 93.1 09/11/2014   PLT 181 09/11/2014   PROTIME: No results for input(s): LABPROT, INR in the last 72 hours. Chemistry No results for input(s): NA, K, CL, CO2, BUN, CREATININE, CALCIUM, PROT, BILITOT, ALKPHOS, ALT, AST, GLUCOSE in the last 168 hours.  Invalid input(s): LABALBU Lipids No results found for: CHOL, HDL, LDLCALC, TRIG BNP No results found for: PROBNP Thyroid Function Tests: No results for input(s): TSH, T4TOTAL, T3FREE, THYROIDAB in the last 72 hours.  Invalid input(s): FREET3    Miscellaneous No results found for: DDIMER  Radiology/Studies:  No results found.  EKG:  Sinus rhythm at 76 Intervals 21/11/41 RSR prime   Assessment and Plan:    Atrial fibrillation-by report   SVT-adenosine sensitive  Bradycardia i.e. AF with a slow ventricular response  COPD   The patient apparently had atrial fibrillation last summer. We will need to get these tracings. In the event that this is clarified, we have broached the topic of anticoagulation and its role and thromboembolic risk reduction.  It is not clear from the records at [facility] whether he had recurrent atrial fibrillation or not or just the SVT. ECGs described only PACs. Hence, we will  discontinue the amiodarone. I have instructed the family how to take the pulse. The value of this would be to identify atrial fibrillation whether his symptomatic or not because it wasn't informed decision regarding the need for anticoagulation.  He was started on Flomax at the hospital; this is been associated with nocturia. They asked that they could discontinue it and I have said yes  Sherryl Manges

## 2014-12-02 ENCOUNTER — Other Ambulatory Visit: Payer: Self-pay | Admitting: Internal Medicine

## 2015-01-02 ENCOUNTER — Ambulatory Visit (INDEPENDENT_AMBULATORY_CARE_PROVIDER_SITE_OTHER): Payer: Medicare Other | Admitting: Podiatry

## 2015-01-02 DIAGNOSIS — B351 Tinea unguium: Secondary | ICD-10-CM | POA: Diagnosis not present

## 2015-01-02 DIAGNOSIS — M79676 Pain in unspecified toe(s): Secondary | ICD-10-CM

## 2015-01-02 NOTE — Progress Notes (Signed)
Subjective: 81 y.o. returns the office today for painful, elongated, thickened toenails which he is unable to trim himself. Denies any redness or drainage around the nails. Denies any acute changes since last appointment and no new complaints today. Denies any systemic complaints such as fevers, chills, nausea, vomiting.   Objective: AAO 3, NAD DP/PT pulses palpable, CRT less than 3 seconds Nails hypertrophic, dystrophic, elongated, brittle, discolored 9. There is tenderness overlying the nails 1-5 on the left and 2-5 on the right. There is no surrounding erythema or drainage along the nail sites. No open lesions or pre-ulcerative lesions are identified. No other areas of tenderness bilateral lower extremities. No overlying edema, erythema, increased warmth. No pain with calf compression, swelling, warmth, erythema.  Assessment: Patient presents with symptomatic onychomycosis  Plan: -Treatment options including alternatives, risks, complications were discussed -Nails sharply debrided 9 without complication/bleeding. -Discussed daily foot inspection. If there are any changes, to call the office immediately.  -Follow-up in 3 months or sooner if any problems are to arise. In the meantime, encouraged to call the office with any questions, concerns, changes symptoms.  Damon Hargrove, DPM   

## 2015-02-24 ENCOUNTER — Ambulatory Visit
Admission: RE | Admit: 2015-02-24 | Discharge: 2015-02-24 | Disposition: A | Discharge status: Home / Self Care | Payer: Medicare Other | Source: Ambulatory Visit | Attending: Internal Medicine | Admitting: Internal Medicine

## 2015-02-24 ENCOUNTER — Ambulatory Visit (INDEPENDENT_AMBULATORY_CARE_PROVIDER_SITE_OTHER): Payer: Medicare Other | Admitting: Internal Medicine

## 2015-02-24 ENCOUNTER — Encounter: Payer: Self-pay | Admitting: Internal Medicine

## 2015-02-24 VITALS — BP 130/72 | HR 84 | Ht 74.0 in | Wt 193.0 lb

## 2015-02-24 DIAGNOSIS — R0602 Shortness of breath: Secondary | ICD-10-CM

## 2015-02-24 DIAGNOSIS — J439 Emphysema, unspecified: Secondary | ICD-10-CM | POA: Diagnosis not present

## 2015-02-24 DIAGNOSIS — I4891 Unspecified atrial fibrillation: Secondary | ICD-10-CM | POA: Diagnosis not present

## 2015-02-24 DIAGNOSIS — R000 Tachycardia, unspecified: Secondary | ICD-10-CM | POA: Diagnosis not present

## 2015-02-24 LAB — BASIC METABOLIC PANEL
Anion gap: 5 (ref 5–15)
BUN: 20 mg/dL (ref 6–20)
CHLORIDE: 110 mmol/L (ref 101–111)
CO2: 23 mmol/L (ref 22–32)
Calcium: 8.8 mg/dL — ABNORMAL LOW (ref 8.9–10.3)
Creatinine, Ser: 0.87 mg/dL (ref 0.61–1.24)
GFR calc Af Amer: >60 mL/min (ref >60)
GFR calc non Af Amer: >60 mL/min (ref >60)
GLUCOSE: 80 mg/dL (ref 65–99)
POTASSIUM: 4.3 mmol/L (ref 3.5–5.1)
Sodium: 138 mmol/L (ref 135–145)

## 2015-02-24 LAB — BRAIN NATRIURETIC PEPTIDE: B NATRIURETIC PEPTIDE 5: 112 pg/mL — AB (ref 0.0–100.0)

## 2015-02-24 MED ORDER — FUROSEMIDE 20 MG PO TABS: 20.0000 mg | ORAL_TABLET | Freq: Every day | ORAL | Status: DC

## 2015-02-24 NOTE — Patient Instructions (Addendum)
Medication Instructions:  Your physician has recommended you make the following change in your medication:  START taking lasix 20mg  once per day   Labwork: Your physician recommends that you have labs today: BMET, BNP   Testing/Procedures: A chest x-ray takes a picture of the organs and structures inside the chest, including the heart, lungs, and blood vessels. This test can show several things, including, whether the heart is enlarges; whether fluid is building up in the lungs; and whether pacemaker / defibrillator leads are still in place.   Follow-Up: Your physician recommends that you schedule a follow-up appointment in: 3 months with Dr. Graciela Husbands    Any Other Special Instructions Will Be Listed Below (If Applicable).  Refer to pulmonology  Oxygen level checked at today's visit was 96% at rest on room air.

## 2015-02-24 NOTE — Progress Notes (Signed)
      Patient Care Team: Lorre Munroe, NP as PCP - General (Internal Medicine)   HPI  [facility] [name] is a 81 y.o. male Seen in follow-up for SVT. Included adenosine sensitive tachycardia as well as apparently atrial fibrillation although the only strips available and demonstrated PACs.  His major issue remains shortness of breath. This has become progressive. He does have not much edema. He denies chest pain.  Records and Results Reviewed echocardiogram 2/16 [facility] normal LV function Chest CT scan evidence of emphysema  Past Medical History  Diagnosis Date  . Arthritis   . Asthma   . Chicken pox   . Diverticulitis   . Emphysema of lung   . Frequent headaches   . Allergy   . Hypertension   . Arrhythmia   . History of colon polyps     Past Surgical History  Procedure Laterality Date  . Appendectomy    . Tonsillectomy    . Back surgery      Current Outpatient Prescriptions  Medication Sig Dispense Refill  . acetaminophen (TYLENOL) 325 MG tablet Take 650 mg by mouth every 6 (six) hours as needed for moderate pain.     Marland Kitchen SPIRIVA HANDIHALER 18 MCG inhalation capsule INHALE 1 CAPSULE DAILY 30 capsule 3   No current facility-administered medications for this visit.    No Known Allergies    Review of Systems negative except from HPI and PMH  Physical Exam BP 130/72 mmHg  Pulse 84  Ht 6\' 2"  (1.88 m)  Wt 193 lb (87.544 kg)  BMI 24.77 kg/m2  SpO2 97% Well developed and well nourished in no acute distress HENT normal E scleral and icterus clear Neck Supple JVP 8-10 cm; carotids brisk and full  Decreased breath sounds Regular rate and rhythm, no murmurs gallops or rub Soft with active bowel sounds No clubbing cyanosis Trace Edema Alert and oriented, grossly normal motor and sensory function Skin Warm and Dry  ECG done sinus rhythm at 84 intervals 18/10/38  Assessment and  Plan  SVT  COPD/emphysema  Dyspnea on exertion  I'm bothered by the dyspnea.  Review of the outside films suggests that there may be more COPD that has been appreciated. We will measure his O2 sat today  There is some evidence of right-sided volume overload so we will measure a BNP and start him on a low-dose diuretic.  echocardiogram from [facility] failed to identify pulmonary pressure or TR jet.   He does have right atrial enlargement. We will get a chest x-ray today and then arrange for pulmonary evaluation.  .  More than 50% of 45 min was spent in counseling related to the above

## 2015-03-17 ENCOUNTER — Encounter: Payer: Self-pay | Admitting: Internal Medicine

## 2015-03-17 ENCOUNTER — Ambulatory Visit (INDEPENDENT_AMBULATORY_CARE_PROVIDER_SITE_OTHER): Payer: Medicare Other | Admitting: Internal Medicine

## 2015-03-17 VITALS — BP 100/68 | HR 62 | Ht 73.0 in | Wt 186.0 lb

## 2015-03-17 DIAGNOSIS — J449 Chronic obstructive pulmonary disease, unspecified: Secondary | ICD-10-CM | POA: Diagnosis not present

## 2015-03-17 DIAGNOSIS — Z23 Encounter for immunization: Secondary | ICD-10-CM

## 2015-03-17 MED ORDER — ALBUTEROL SULFATE HFA 108 (90 BASE) MCG/ACT IN AERS: 2.0000 | INHALATION_SPRAY | Freq: Four times a day (QID) | RESPIRATORY_TRACT | Status: DC | PRN

## 2015-03-17 MED ORDER — FLUTICASONE FUROATE-VILANTEROL 100-25 MCG/INH IN AEPB
1.0000 | INHALATION_SPRAY | Freq: Every day | RESPIRATORY_TRACT | Status: AC
Start: 2015-03-17 — End: 2015-03-18

## 2015-03-17 MED ORDER — FLUTICASONE FUROATE-VILANTEROL 100-25 MCG/INH IN AEPB: 1.0000 | INHALATION_SPRAY | Freq: Every day | RESPIRATORY_TRACT | Status: DC
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
## 2015-03-17 NOTE — Patient Instructions (Signed)
Chronic Obstructive Pulmonary Disease Chronic obstructive pulmonary disease (COPD) is a common lung condition in which airflow from the lungs is limited. COPD is a general term that can be used to describe many different lung problems that limit airflow, including both chronic bronchitis and emphysema. If you have COPD, your lung function will probably never return to normal, but there are measures you can take to improve lung function and make yourself feel better. CAUSES   Smoking (common).  Exposure to secondhand smoke.  Genetic problems.  Chronic inflammatory lung diseases or recurrent infections. SYMPTOMS  Shortness of breath, especially with physical activity.  Deep, persistent (chronic) cough with a large amount of thick mucus.  Wheezing.  Rapid breaths (tachypnea).  Gray or bluish discoloration (cyanosis) of the skin, especially in your fingers, toes, or lips.  Fatigue.  Weight loss.  Frequent infections or episodes when breathing symptoms become much worse (exacerbations).  Chest tightness. DIAGNOSIS Your health care provider will take a medical history and perform a physical examination to diagnose COPD. Additional tests for COPD may include:  Lung (pulmonary) function tests.  Chest X-ray.  CT scan.  Blood tests. TREATMENT  Treatment for COPD may include:  Inhaler and nebulizer medicines. These help manage the symptoms of COPD and make your breathing more comfortable.  Supplemental oxygen. Supplemental oxygen is only helpful if you have a low oxygen level in your blood.  Exercise and physical activity. These are beneficial for nearly all people with COPD.  Lung surgery or transplant.  Nutrition therapy to gain weight, if you are underweight.  Pulmonary rehabilitation. This may involve working with a team of health care providers and specialists, such as respiratory, occupational, and physical therapists. HOME CARE INSTRUCTIONS  Take all medicines  (inhaled or pills) as directed by your health care provider.  Avoid over-the-counter medicines or cough syrups that dry up your airway (such as antihistamines) and slow down the elimination of secretions unless instructed otherwise by your health care provider.  If you are a smoker, the most important thing that you can do is stop smoking. Continuing to smoke will cause further lung damage and breathing trouble. Ask your health care provider for help with quitting smoking. He or she can direct you to community resources or hospitals that provide support.  Avoid exposure to irritants such as smoke, chemicals, and fumes that aggravate your breathing.  Use oxygen therapy and pulmonary rehabilitation if directed by your health care provider. If you require home oxygen therapy, ask your health care provider whether you should purchase a pulse oximeter to measure your oxygen level at home.  Avoid contact with individuals who have a contagious illness.  Avoid extreme temperature and humidity changes.  Eat healthy foods. Eating smaller, more frequent meals and resting before meals may help you maintain your strength.  Stay active, but balance activity with periods of rest. Exercise and physical activity will help you maintain your ability to do things you want to do.  Preventing infection and hospitalization is very important when you have COPD. Make sure to receive all the vaccines your health care provider recommends, especially the pneumococcal and influenza vaccines. Ask your health care provider whether you need a pneumonia vaccine.  Learn and use relaxation techniques to manage stress.  Learn and use controlled breathing techniques as directed by your health care provider. Controlled breathing techniques include:  Pursed lip breathing. Start by breathing in (inhaling) through your nose for 1 second. Then, purse your lips as if you were   going to whistle and breathe out (exhale) through the  pursed lips for 2 seconds.  Diaphragmatic breathing. Start by putting one hand on your abdomen just above your waist. Inhale slowly through your nose. The hand on your abdomen should move out. Then purse your lips and exhale slowly. You should be able to feel the hand on your abdomen moving in as you exhale.  Learn and use controlled coughing to clear mucus from your lungs. Controlled coughing is a series of short, progressive coughs. The steps of controlled coughing are: 1. Lean your head slightly forward. 2. Breathe in deeply using diaphragmatic breathing. 3. Try to hold your breath for 3 seconds. 4. Keep your mouth slightly open while coughing twice. 5. Spit any mucus out into a tissue. 6. Rest and repeat the steps once or twice as needed. SEEK MEDICAL CARE IF:  You are coughing up more mucus than usual.  There is a change in the color or thickness of your mucus.  Your breathing is more labored than usual.  Your breathing is faster than usual. SEEK IMMEDIATE MEDICAL CARE IF:  You have shortness of breath while you are resting.  You have shortness of breath that prevents you from:  Being able to talk.  Performing your usual physical activities.  You have chest pain lasting longer than 5 minutes.  Your skin color is more cyanotic than usual.  You measure low oxygen saturations for longer than 5 minutes with a pulse oximeter. MAKE SURE YOU:  Understand these instructions.  Will watch your condition.  Will get help right away if you are not doing well or get worse.   This information is not intended to replace advice given to you by your health care provider. Make sure you discuss any questions you have with your health care provider.   Document Released: 03/02/2005 Document Revised: 06/13/2014 Document Reviewed: 01/17/2013 Elsevier Interactive Patient Education 2016 Elsevier Inc.  

## 2015-03-25 ENCOUNTER — Other Ambulatory Visit: Payer: Self-pay | Admitting: Internal Medicine

## 2015-04-03 ENCOUNTER — Encounter: Payer: Self-pay | Admitting: Podiatry

## 2015-04-03 ENCOUNTER — Ambulatory Visit (INDEPENDENT_AMBULATORY_CARE_PROVIDER_SITE_OTHER): Payer: Medicare Other | Admitting: Podiatry

## 2015-04-03 DIAGNOSIS — B351 Tinea unguium: Secondary | ICD-10-CM | POA: Diagnosis not present

## 2015-04-03 DIAGNOSIS — M79676 Pain in unspecified toe(s): Secondary | ICD-10-CM

## 2015-04-06 NOTE — Progress Notes (Signed)
Subjective: 81 y.o. returns the office today for painful, elongated, thickened toenails which he is unable to trim himself. Denies any redness or drainage around the nails. Denies any acute changes since last appointment and no new complaints today. Denies any systemic complaints such as fevers, chills, nausea, vomiting.   Objective: AAO 3, NAD DP/PT pulses palpable, CRT less than 3 seconds Nails hypertrophic, dystrophic, elongated, brittle, discolored 9. There is tenderness overlying the nails 1-5 on the left and 2-5 on the right. There is no surrounding erythema or drainage along the nail sites. No open lesions or pre-ulcerative lesions are identified. No other areas of tenderness bilateral lower extremities. No overlying edema, erythema, increased warmth. No pain with calf compression, swelling, warmth, erythema.  Assessment: Patient presents with symptomatic onychomycosis  Plan: -Treatment options including alternatives, risks, complications were discussed -Nails sharply debrided 9 without complication/bleeding. -Discussed daily foot inspection. If there are any changes, to call the office immediately.  -Follow-up in 3 months or sooner if any problems are to arise. In the meantime, encouraged to call the office with any questions, concerns, changes symptoms.  Ovid Curd, DPM

## 2015-04-09 ENCOUNTER — Encounter: Payer: Self-pay | Admitting: Internal Medicine

## 2015-04-17 ENCOUNTER — Telehealth: Payer: Self-pay | Admitting: Internal Medicine

## 2015-04-17 NOTE — Telephone Encounter (Signed)
Called pt and informed him of him needing O2 @ night. Pt stated he was not going to get the O2. Informed pt of his desats and he states he doesn't know how he did cause he didn't hardly sleep and he didn't keep that on his finger.   FYI: pt refusing the O2 @ 2L at night.

## 2015-04-20 NOTE — Telephone Encounter (Signed)
Nothing else to offer. 

## 2015-04-20 NOTE — Telephone Encounter (Signed)
Pt informed. Nothing further needed. 

## 2015-05-14 ENCOUNTER — Ambulatory Visit (INDEPENDENT_AMBULATORY_CARE_PROVIDER_SITE_OTHER): Payer: Medicare Other | Admitting: Internal Medicine

## 2015-05-14 ENCOUNTER — Encounter: Payer: Self-pay | Admitting: Internal Medicine

## 2015-05-14 VITALS — BP 122/60 | HR 139 | Ht 73.0 in | Wt 198.0 lb

## 2015-05-14 DIAGNOSIS — I4891 Unspecified atrial fibrillation: Secondary | ICD-10-CM

## 2015-05-14 MED ORDER — FLECAINIDE ACETATE 100 MG PO TABS: 100.0000 mg | ORAL_TABLET | Freq: Two times a day (BID) | ORAL | Status: DC

## 2015-05-14 NOTE — Patient Instructions (Signed)
Medication Instructions: - Start Flecainide 100 mg one tablet by mouth twice daily - Continue all of your other medications as are  Labwork: - none  Procedures/Testing: - none  Follow-Up: - Your physician recommends that you schedule a follow-up appointment in: 4 weeks with Dr. Graciela Husbands.  Any Additional Special Instructions Will Be Listed Below (If Applicable).

## 2015-05-14 NOTE — Progress Notes (Signed)
Patient Care Team: Lorre Munroe, NP as PCP - General (Internal Medicine)   HPI  [name] is a 81 y.o. male Seen in follow-up for SVT. Included adenosine sensitive tachycardia as well as apparently atrial fibrillation although the only strips available and demonstrated PACs.  His major issue remains shortness of breath. This has become progressive. He does have not much edema. He denies chest pain. He is aware of his heart racing when he stands up and walks.  Records and Results Reviewed echocardiogram 2/16 [facility] normal LV function Chest CT scan evidence of emphysema  Past Medical History  Diagnosis Date  . Arthritis   . Asthma   . Chicken pox   . Diverticulitis   . Emphysema of lung (HCC)   . Frequent headaches   . Allergy   . Hypertension   . Arrhythmia   . History of colon polyps     Past Surgical History  Procedure Laterality Date  . Appendectomy    . Tonsillectomy    . Back surgery      Current Outpatient Prescriptions  Medication Sig Dispense Refill  . acetaminophen (TYLENOL) 325 MG tablet Take 650 mg by mouth every 6 (six) hours as needed for moderate pain.     Marland Kitchen albuterol (PROVENTIL HFA;VENTOLIN HFA) 108 (90 BASE) MCG/ACT inhaler Inhale 2 puffs into the lungs every 6 (six) hours as needed for wheezing or shortness of breath. 1 Inhaler 2  . Fluticasone Furoate-Vilanterol 100-25 MCG/INH AEPB Inhale 1 puff into the lungs daily. 60 each 3  . SPIRIVA HANDIHALER 18 MCG inhalation capsule USE 1 CAPSULE IN HANDIHALER DAILY 30 capsule 3   No current facility-administered medications for this visit.    No Known Allergies    Review of Systems negative except from HPI and PMH  Physical Exam BP 122/60 mmHg  Pulse 139  Ht  (1.854 m)  Wt 198 lb (89.812 kg)  BMI 26.13 kg/m2  SpO2 96% Well developed and well nourished in no acute distress HENT normal E scleral and icterus clear Neck Supple JVP 8-10 cm; carotids brisk and full  Decreased breath  sounds Rapid but Regular rate and rhythm, no murmurs gallops or rub Soft with active bowel sounds No clubbing cyanosis Trace Edema Alert and oriented, grossly normal motor and sensory function Skin Warm and Dry  ECG SVT-short RP at a rate of 140 Carotid massage applied 4 times terminated the tachycardia on each occasion only to resume 2-4 minutes later   Assessment and  Plan  SVT-incessant  Sinus bradycardia  COPD/emphysema  Dyspnea on exertion   The patient has SVT and I suspect that his tachycardia is contributing to his exercise intolerance. It is his impression that getting up and exercising causes his heart rate to go fast hence the association.  After his experience at [facility], he is extremely reluctant to return to hospital for anything. He is thus disinclined to pursue catheter ablation although I suspect with his incessant SVT which I think is AV nodal reentry that this would be the preferred therapy not withstanding his age.  He has had problems with bradycardia previously with amiodarone; this is not surprising as his posttermination sinus rate is in the 40s. Hence, we will try flecainide as AV nodal blocking agents were also be precluded.  Echocardiogram was reviewed from 2015 which demonstrated normal LV function and normal wall thickness.he has no known coronary artery disease  More than 50% of 45 min was  spent in counseling related to the above as well as repeated carotid sinus massage for cardioversion

## 2015-06-09 ENCOUNTER — Ambulatory Visit: Payer: Medicare Other

## 2015-06-16 ENCOUNTER — Ambulatory Visit: Payer: Medicare Other

## 2015-06-22 ENCOUNTER — Ambulatory Visit: Payer: Medicare Other | Admitting: Internal Medicine

## 2015-06-23 ENCOUNTER — Ambulatory Visit (INDEPENDENT_AMBULATORY_CARE_PROVIDER_SITE_OTHER): Payer: Medicare Other | Admitting: *Deleted

## 2015-06-23 DIAGNOSIS — J438 Other emphysema: Secondary | ICD-10-CM

## 2015-06-23 NOTE — Progress Notes (Signed)
SMW performed today. Pt unable to perform PFT. Unable to comprehend how to perform test.

## 2015-07-02 ENCOUNTER — Ambulatory Visit (INDEPENDENT_AMBULATORY_CARE_PROVIDER_SITE_OTHER): Payer: Medicare Other | Admitting: Internal Medicine

## 2015-07-02 ENCOUNTER — Encounter: Payer: Self-pay | Admitting: Internal Medicine

## 2015-07-02 VITALS — BP 128/84 | HR 140 | Ht 72.0 in | Wt 181.5 lb

## 2015-07-02 DIAGNOSIS — I480 Paroxysmal atrial fibrillation: Secondary | ICD-10-CM

## 2015-07-02 NOTE — Patient Instructions (Addendum)
Medication Instructions: - no changes  Labwork: - none  Procedures/Testing: - none  Follow-Up: - Your physician recommends that you schedule a follow-up appointment in: 3-4 months with Dr. Graciela Husbands. .  Any Additional Special Instructions Will Be Listed Below (If Applicable).     If you need a refill on your cardiac medications before your next appointment, please call your pharmacy.

## 2015-07-02 NOTE — Progress Notes (Signed)
      Patient Care Team: Lorre Munroe, NP as PCP - General (Internal Medicine)   HPI  [name] is a 82 y.o. male Seen in follow-up for SVT. Included adenosine sensitive tachycardia as well as apparently atrial fibrillation although the only strips available and demonstrated PACs.  He had been having symptomatic tachycardia which I thought was A-V nodal reentry. It been increasingly frequent.  He has also had problems with bradycardia and as he has been of averse to going to the hospital we placed him on flecainide at our last visit.  However, he refused to take it as noted below. DATE PR interval QRSduration Dose-  *12**/16 -- 94 0  1/17 -- 94 0     Records and Results Reviewed echocardiogram 2/16 [facility] normal LV function Chest CT scan evidence of emphysema  Past Medical History  Diagnosis Date  . Arthritis   . Asthma   . Chicken pox   . Diverticulitis   . Emphysema of lung (HCC)   . Frequent headaches   . Allergy   . Hypertension   . Arrhythmia   . History of colon polyps     Past Surgical History  Procedure Laterality Date  . Appendectomy    . Tonsillectomy    . Back surgery      Current Outpatient Prescriptions  Medication Sig Dispense Refill  . acetaminophen (TYLENOL) 325 MG tablet Take 650 mg by mouth every 6 (six) hours as needed for moderate pain.     Marland Kitchen albuterol (PROVENTIL HFA;VENTOLIN HFA) 108 (90 BASE) MCG/ACT inhaler Inhale 2 puffs into the lungs every 6 (six) hours as needed for wheezing or shortness of breath. 1 Inhaler 2  . Fluticasone Furoate-Vilanterol 100-25 MCG/INH AEPB Inhale 1 puff into the lungs daily. 60 each 3  . SPIRIVA HANDIHALER 18 MCG inhalation capsule USE 1 CAPSULE IN HANDIHALER DAILY 30 capsule 3   No current facility-administered medications for this visit.    No Known Allergies    Review of Systems negative except from HPI and PMH  Physical Exam BP 128/84 mmHg  Pulse 140  Ht 6' (1.829 m)  Wt 181 lb 8 oz (82.328 kg)   BMI 24.61 kg/m2 Well developed and well nourished in no acute distress HENT normal E scleral and icterus clear Neck Supple JVP 8-10 cm; carotids brisk and full  Decreased breath sounds Rapid but Regular rate and rhythm, no murmurs gallops or rub Soft with active bowel sounds No clubbing cyanosis Trace Edema Alert and oriented, grossly normal motor and sensory function Skin Warm and Dry  ECG SVT-short RP at a rate of 140     Assessment and  Plan  SVT-incessant  Sinus bradycardia  COPD/emphysema  Dyspnea on exertion     He continues to have tachycardia. He had not been willing to take the flecainide because the instructions contained the word hospital twice. I have encouraged him to take the medication as I am concerned about the consequences of incessant tachycardia and it's likely progression in terms of his exercise capacity and impact on heart function. He is agreeable.

## 2015-07-07 ENCOUNTER — Ambulatory Visit: Payer: Medicare Other | Admitting: Internal Medicine

## 2015-07-10 ENCOUNTER — Ambulatory Visit: Payer: Medicare Other | Admitting: Internal Medicine

## 2015-07-15 ENCOUNTER — Telehealth: Payer: Self-pay | Admitting: *Deleted

## 2015-07-15 NOTE — Telephone Encounter (Signed)
Pt daughter calling asking if patient is to be on flecainide or not.   Please advise.

## 2015-07-15 NOTE — Telephone Encounter (Signed)
Per Dr. Odessa Fleming notes from Jan OV:  "He continues to have tachycardia. He had not been willing to take the flecainide because the instructions contained the word hospital twice. I have encouraged him to take the medication as I am concerned about the consequences of incessant tachycardia and it's likely progression in terms of his exercise capacity and impact on heart function. He is agreeable."  Flecainide is not listed on pt current medication list. Will forward to Herbert Seta, RN to advise.

## 2015-07-16 ENCOUNTER — Encounter: Payer: Self-pay | Admitting: *Deleted

## 2015-07-16 ENCOUNTER — Telehealth: Payer: Self-pay | Admitting: Internal Medicine

## 2015-07-16 NOTE — Telephone Encounter (Signed)
[name] is returning a call from [place].  Please call back.

## 2015-07-16 NOTE — Telephone Encounter (Signed)
He was supposed to be on flecainide to try and stop his SVt 100 bid

## 2015-07-16 NOTE — Telephone Encounter (Signed)
Pt wanted to switch from [facility] to Dr Alphonsus Sias he wanted a internal med dr i let her know dr Alphonsus Sias next new patient appointment would be aug  he was ok with this  Is it ok to switch? If so do you want to see him sooner thank aug dr Alphonsus Sias

## 2015-07-16 NOTE — Addendum Note (Signed)
Addended by: Sherri Rad C on: 07/16/2015 02:28 PM   Modules accepted: Orders

## 2015-07-16 NOTE — Telephone Encounter (Signed)
Okay with me 

## 2015-07-16 NOTE — Telephone Encounter (Signed)
I left a message for [name] to call.

## 2015-07-16 NOTE — Telephone Encounter (Signed)
I spoke with [name]. She is aware that Dr. Graciela Husbands does want the patient to take flecainide 100 mg BID. She states that patient does have this at home, but did not start this because it was not on his discharge sheet when he left the office on 07/02/15. I advised [name] that this was an oversight and unintentionally not relayed to nursing at the time of his visit. She would like me to mail a letter to the patient stating this as he will more easily take this medication if he sees this in writing. I advised her I would be glad to do this for her. Letter done and mailed to the patient with an updated medication list.

## 2015-07-16 NOTE — Telephone Encounter (Signed)
Okay with me Set up when needed

## 2015-07-17 ENCOUNTER — Ambulatory Visit: Payer: Medicare Other | Admitting: Podiatry

## 2015-07-17 ENCOUNTER — Other Ambulatory Visit: Payer: Self-pay

## 2015-07-17 MED ORDER — FLUTICASONE FUROATE-VILANTEROL 100-25 MCG/INH IN AEPB: 1.0000 | INHALATION_SPRAY | Freq: Every day | RESPIRATORY_TRACT | Status: DC

## 2015-07-22 NOTE — Telephone Encounter (Signed)
Appointment 2/21 °

## 2015-07-23 ENCOUNTER — Ambulatory Visit: Payer: Medicare Other | Admitting: Internal Medicine

## 2015-07-24 ENCOUNTER — Ambulatory Visit: Payer: Medicare Other | Admitting: Podiatry

## 2015-07-28 ENCOUNTER — Ambulatory Visit (INDEPENDENT_AMBULATORY_CARE_PROVIDER_SITE_OTHER): Payer: Medicare Other

## 2015-07-28 VITALS — BP 122/72 | HR 67 | Temp 98.1°F | Ht 73.0 in | Wt 182.0 lb

## 2015-07-28 DIAGNOSIS — Z0000 Encounter for general adult medical examination without abnormal findings: Secondary | ICD-10-CM

## 2015-07-28 DIAGNOSIS — Z973 Presence of spectacles and contact lenses: Secondary | ICD-10-CM | POA: Diagnosis not present

## 2015-07-28 DIAGNOSIS — Z8679 Personal history of other diseases of the circulatory system: Secondary | ICD-10-CM

## 2015-07-28 DIAGNOSIS — J438 Other emphysema: Secondary | ICD-10-CM

## 2015-07-28 NOTE — Progress Notes (Signed)
Pre visit review using our clinic review tool, if applicable. No additional management support is needed unless otherwise documented below in the visit note. 

## 2015-07-28 NOTE — Patient Instructions (Addendum)
[name] , Thank you for taking time to come for your Medicare Wellness Visit. I appreciate your ongoing commitment to your health goals. Please review the following plan we discussed and let me know if I can assist you in the future.   These are the goals we discussed: Goals    Be able to walk without assistive devices.       This is a list of the screening recommended for you and due dates:  Health Maintenance  Topic Date Due  . Tetanus Vaccine  Will discuss at CPE  . Shingles Vaccine  Will discuss at CPE  . Pneumonia vaccines (1 of 2 - PCV13) Will discuss at CPE  . Flu Shot  01/05/2016   Preventive Care for Adults  A healthy lifestyle and preventive care can promote health and wellness. Preventive health guidelines for adults include the following key practices.  . A routine yearly physical is a good way to check with your health care provider about your health and preventive screening. It is a chance to share any concerns and updates on your health and to receive a thorough exam.  . Visit your dentist for a routine exam and preventive care every 6 months. Brush your teeth twice a day and floss once a day. Good oral hygiene prevents tooth decay and gum disease.  . The frequency of eye exams is based on your age, health, family medical history, use  of contact lenses, and other factors. Follow your health care provider's ecommendations for frequency of eye exams.  . Eat a healthy diet. Foods like vegetables, fruits, whole grains, low-fat dairy products, and lean protein foods contain the nutrients you need without too many calories. Decrease your intake of foods high in solid fats, added sugars, and salt. Eat the right amount of calories for you. Get information about a proper diet from your health care provider, if necessary.  . Regular physical exercise is one of the most important things you can do for your health. Most adults should get at least 150 minutes of moderate-intensity  exercise (any activity that increases your heart rate and causes you to sweat) each week. In addition, most adults need muscle-strengthening exercises on 2 or more days a week.  . Maintain a healthy weight. The body mass index (BMI) is a screening tool to identify possible weight problems. It provides an estimate of body fat based on height and weight. Your health care provider can find your BMI and can help you achieve or maintain a healthy weight.   For adults 20 years and older: ? A BMI below 18.5 is considered underweight. ? A BMI of 18.5 to 24.9 is normal. ? A BMI of 25 to 29.9 is considered overweight. ? A BMI of 30 and above is considered obese.   . Maintain normal blood lipids and cholesterol levels by exercising and minimizing your intake of saturated fat. Eat a balanced diet with plenty of fruit and vegetables. Blood tests for lipids and cholesterol should begin at age 7 and be repeated every 5 years. If your lipid or cholesterol levels are high, you are over 50, or you are at high risk for heart disease, you may need your cholesterol levels checked more frequently. Ongoing high lipid and cholesterol levels should be treated with medicines if diet and exercise are not working.  . If you smoke, find out from your health care provider how to quit. If you do not use tobacco, please do not start.  Marland Kitchen  If you choose to drink alcohol, please do not consume more than 2 drinks per day. One drink is considered to be 12 ounces (355 mL) of beer, 5 ounces (148 mL) of wine, or 1.5 ounces (44 mL) of liquor.  . If you are 74-34 years old, ask your health care provider if you should take aspirin to prevent strokes.  . Osteoporosis is a disease in which the bones lose minerals and strength with aging. This can result in serious bone fractures or breaks. The risk of osteoporosis can be identified using a bone density scan. Women ages 69 years and over and women at risk for fractures or osteoporosis should  discuss screening with their health care providers. Ask your health care provider whether you should take a calcium supplement or vitamin D to reduce the rate of osteoporosis.  . For Women Only: Menopause can be associated with physical symptoms and risks. Hormone replacement therapy is available to decrease symptoms and risks. You should talk to your health care provider about whether hormone replacement therapy is right for you.  . Use sunscreen. Apply sunscreen liberally and repeatedly throughout the day. You should seek shade when your shadow is shorter than you. Protect yourself by wearing long sleeves, pants, a wide-brimmed hat, and sunglasses year round, whenever you are outdoors.  . Once a month, do a whole body skin exam, using a mirror to look at the skin on your back. Tell your health care provider of new moles, moles that have irregular borders, moles that are larger than a pencil eraser, or moles that have changed in shape or color.

## 2015-07-28 NOTE — Progress Notes (Cosign Needed)
Subjective:   [name] is a 82 y.o. male who presents for an Initial Medicare Annual Wellness Visit.  Review of Systems  No ROS  Cardiac Risk Factors include: advanced age (>79men, >55 women)    Objective:    Today's Vitals   07/28/15 1559  BP: 122/72  Pulse: 67  Temp: 98.1 F (36.7 C)  TempSrc: Oral  Height: 6\' 1"  (1.854 m)  Weight: 182 lb (82.555 kg)  SpO2: 93%  PainSc: 0-No pain    Current Medications (verified) Outpatient Encounter Prescriptions as of 07/28/2015  Medication Sig  . acetaminophen (TYLENOL) 325 MG tablet Take 650 mg by mouth every 6 (six) hours as needed for moderate pain.   Marland Kitchen albuterol (PROVENTIL HFA;VENTOLIN HFA) 108 (90 BASE) MCG/ACT inhaler Inhale 2 puffs into the lungs every 6 (six) hours as needed for wheezing or shortness of breath.  . flecainide (TAMBOCOR) 100 MG tablet Take 1 tablet (100 mg total) by mouth 2 (two) times daily.  . Fluticasone Furoate-Vilanterol 100-25 MCG/INH AEPB Inhale 1 puff into the lungs daily.  Marland Kitchen SPIRIVA HANDIHALER 18 MCG inhalation capsule USE 1 CAPSULE IN HANDIHALER DAILY   No facility-administered encounter medications on file as of 07/28/2015.    Allergies (verified) Review of patient's allergies indicates no known allergies.   History: Past Medical History  Diagnosis Date  . Arthritis   . Asthma   . Chicken pox   . Diverticulitis   . Emphysema of lung (HCC)   . Frequent headaches   . Allergy   . Hypertension   . Arrhythmia   . History of colon polyps    Past Surgical History  Procedure Laterality Date  . Appendectomy    . Tonsillectomy    . Back surgery     Family History  Problem Relation Age of Onset  . Arthritis Mother   . Hypertension Mother   . Hyperlipidemia Father   . Hypertension Father   . Alcohol abuse Brother   . Cancer Brother     Lung   Social History   Occupational History  . Not on file.   Social History Main Topics  . Smoking status: Former Smoker -- 0.25 packs/day for  60 years    Types: Cigarettes  . Smokeless tobacco: Former Neurosurgeon     Comment: quit x 1 month  . Alcohol Use: No  . Drug Use: No  . Sexual Activity: Not Currently   Tobacco Counseling Counseling given: No   Activities of Daily Living In your present state of health, do you have any difficulty performing the following activities: 07/28/2015  Hearing? Y   Vision? N  Difficulty concentrating or making decisions? N  Walking or climbing stairs? Y  Dressing or bathing? N  Doing errands, shopping? Y  Preparing Food and eating ? N  Using the Toilet? N  In the past six months, have you accidently leaked urine? N  Do you have problems with loss of bowel control? N  Managing your Medications? N  Managing your Finances? N  Housekeeping or managing your Housekeeping? Y    Immunizations and Health Maintenance Immunization History  Administered Date(s) Administered  . Influenza,inj,Quad PF,36+ Mos 03/17/2015  . Influenza-Unspecified 08/11/2014   Health Maintenance Due  Topic Date Due  . TETANUS/TDAP  11/21/1946  . ZOSTAVAX  11/21/1987  . PNA vac Low Risk Adult (1 of 2 - PCV13) 11/20/1992    Patient Care Team: Karie Schwalbe, MD as PCP - General (Internal Medicine) Provider  list updated.   Assessment:     Hearing/Vision screen  Hearing Screening           Right ear:   0 0 0 0   Left ear:   0 0 0 0     Dietary issues and exercise activities discussed: Current Exercise Habits:: Home exercise routine, Type of exercise: stretching;walking, Time (Minutes): 10, Frequency (Times/Week): > 6, Weekly Exercise (Minutes/Week): 0, Intensity: Mild  Goals    Pt desires to walk/ambulate without assistive devices.      Depression Screen PHQ 2/9 Scores 07/28/2015 09/09/2014  PHQ - 2 Score 0 0    Fall Risk Fall Risk  07/28/2015 09/09/2014  Falls in the past year? No No    Cognitive Function: MMSE - Mini Mental State Exam 07/28/2015  Orientation to  time 5  Orientation to Place 5  Registration 3  Attention/ Calculation 2  Recall 3  Language- repeat 1  Language- follow 3 step command 3  Language- read & follow direction 1    Screening Tests Health Maintenance  Topic Date Due  . TETANUS/TDAP  Will discuss at CPE  . ZOSTAVAX  Will discuss at CPE  . PNA vac Low Risk Adult (1 of 2 - PCV13) Will discuss at CPE  . INFLUENZA VACCINE  01/05/2016        Plan:     During the course of the visit [name] was educated and counseled about the following appropriate screening and preventive services:   Vaccines to include Pneumoccal, tetanus,  Zostavax - will discuss at CPE  Referral to audiology - declined; failed hearing screen  Eye exam - accepted referral to opthalmology; reports difficulty seeing with corrective lenses  HH PT/OT - agreed to work with therapy services to aid with cardiac, pulmonary, and muscular strength; will need to discuss further with PCP during CPE   Patient Instructions (the written plan) were given to the patient.  See attached scanned questionnaire for additional information.   Randa Evens, LPN   1/61/0960

## 2015-07-29 ENCOUNTER — Encounter: Payer: Self-pay | Admitting: *Deleted

## 2015-07-29 LAB — CBC WITH DIFFERENTIAL/PLATELET
BASOS ABS: 0 10*3/uL (ref 0.0–0.1)
BASOS PCT: 0.4 % (ref 0.0–3.0)
EOS ABS: 0.1 10*3/uL (ref 0.0–0.7)
Eosinophils Relative: 1.2 % (ref 0.0–5.0)
HCT: 42 % (ref 39.0–52.0)
HEMOGLOBIN: 14.2 g/dL (ref 13.0–17.0)
Lymphocytes Relative: 19.8 % (ref 12.0–46.0)
Lymphs Abs: 1.5 10*3/uL (ref 0.7–4.0)
MCHC: 33.9 g/dL (ref 30.0–36.0)
MCV: 89.4 fl (ref 78.0–100.0)
MONO ABS: 0.7 10*3/uL (ref 0.1–1.0)
Monocytes Relative: 9 % (ref 3.0–12.0)
Neutro Abs: 5.3 10*3/uL (ref 1.4–7.7)
Neutrophils Relative %: 69.6 % (ref 43.0–77.0)
PLATELETS: 155 10*3/uL (ref 150.0–400.0)
RBC: 4.69 Mil/uL (ref 4.22–5.81)
RDW: 15.7 % — AB (ref 11.5–15.5)
WBC: 7.6 10*3/uL (ref 4.0–10.5)

## 2015-07-29 LAB — COMPREHENSIVE METABOLIC PANEL
ALT: 12 U/L (ref 0–53)
AST: 22 U/L (ref 0–37)
Albumin: 3.8 g/dL (ref 3.5–5.2)
Alkaline Phosphatase: 78 U/L (ref 39–117)
BILIRUBIN TOTAL: 0.8 mg/dL (ref 0.2–1.2)
BUN: 19 mg/dL (ref 6–23)
CO2: 25 mEq/L (ref 19–32)
CREATININE: 0.94 mg/dL (ref 0.40–1.50)
Calcium: 9.3 mg/dL (ref 8.4–10.5)
Chloride: 103 mEq/L (ref 96–112)
GFR: 80.56 mL/min (ref >60.00)
GLUCOSE: 99 mg/dL (ref 70–99)
Potassium: 4.3 mEq/L (ref 3.5–5.1)
SODIUM: 135 meq/L (ref 135–145)
TOTAL PROTEIN: 6.8 g/dL (ref 6.0–8.3)

## 2015-07-29 LAB — LIPID PANEL
Cholesterol: 143 mg/dL (ref 0–200)
HDL: 39.3 mg/dL (ref >39.00)
LDL Cholesterol: 73 mg/dL (ref 0–99)
NONHDL: 103.93
Total CHOL/HDL Ratio: 4
Triglycerides: 154 mg/dL — ABNORMAL HIGH (ref 0.0–149.0)
VLDL: 30.8 mg/dL (ref 0.0–40.0)

## 2015-07-29 NOTE — Progress Notes (Signed)
   Subjective:    Patient ID: [name], male    DOB: 09-15-1925, 82 y.o.   MRN: [id number]  HPI I reviewed health advisor's note, was available for consultation, and agree with documentation and plan.    Review of Systems     Objective:   Physical Exam        Assessment & Plan:

## 2015-08-04 ENCOUNTER — Encounter: Payer: Self-pay | Admitting: Internal Medicine

## 2015-08-04 ENCOUNTER — Ambulatory Visit (INDEPENDENT_AMBULATORY_CARE_PROVIDER_SITE_OTHER): Payer: Medicare Other | Admitting: Internal Medicine

## 2015-08-04 VITALS — BP 120/60 | HR 67 | Temp 97.7°F | Ht 73.0 in | Wt 197.0 lb

## 2015-08-04 DIAGNOSIS — J438 Other emphysema: Secondary | ICD-10-CM

## 2015-08-04 DIAGNOSIS — N400 Enlarged prostate without lower urinary tract symptoms: Secondary | ICD-10-CM

## 2015-08-04 DIAGNOSIS — I480 Paroxysmal atrial fibrillation: Secondary | ICD-10-CM | POA: Diagnosis not present

## 2015-08-04 DIAGNOSIS — J449 Chronic obstructive pulmonary disease, unspecified: Secondary | ICD-10-CM

## 2015-08-04 DIAGNOSIS — Z23 Encounter for immunization: Secondary | ICD-10-CM

## 2015-08-04 DIAGNOSIS — R51 Headache: Secondary | ICD-10-CM

## 2015-08-04 DIAGNOSIS — I471 Supraventricular tachycardia: Secondary | ICD-10-CM | POA: Diagnosis not present

## 2015-08-04 DIAGNOSIS — R519 Headache, unspecified: Secondary | ICD-10-CM

## 2015-08-04 MED ORDER — TIOTROPIUM BROMIDE MONOHYDRATE 18 MCG IN CAPS: 18.0000 ug | ORAL_CAPSULE | Freq: Every day | RESPIRATORY_TRACT | Status: DC

## 2015-08-04 MED ORDER — ALBUTEROL SULFATE HFA 108 (90 BASE) MCG/ACT IN AERS: 2.0000 | INHALATION_SPRAY | Freq: Four times a day (QID) | RESPIRATORY_TRACT | Status: DC | PRN

## 2015-08-04 NOTE — Assessment & Plan Note (Signed)
Mostly from cervical osteoarthritis Tylenol only for now--discussed heat

## 2015-08-04 NOTE — Addendum Note (Signed)
Addended by: Sueanne Margarita on: 08/04/2015 10:21 AM   Modules accepted: Orders

## 2015-08-04 NOTE — Progress Notes (Signed)
Subjective:    Patient ID: [name], male    DOB: 07/28/1925, 82 y.o.   MRN: [id number]  HPI Here with wife and daughter Lavonna Rua to establish Had seen [facility]--but wanted MD   Has known SVT--sees Dr Graciela Husbands Never had any symptoms No chest pain or palpitations Also with brief atrial fib---felt to have very low burden apparently  Chronic DOE--- from emphysema Coughs occasionally -- much better than in the past  Voids okay Nocturia every 2-3 hours usually ---sometimes less Flow is okay. No sig daytime issues--other than some urgency  Has chronic headaches---various spots Often in back of neck--but sometimes includes his entire body (from toe up to top of head) Tylenol helps--just intermittently needs  Current Outpatient Prescriptions on File Prior to Visit  Medication Sig Dispense Refill  . acetaminophen (TYLENOL) 325 MG tablet Take 650 mg by mouth every 6 (six) hours as needed for moderate pain.     Marland Kitchen albuterol (PROVENTIL HFA;VENTOLIN HFA) 108 (90 BASE) MCG/ACT inhaler Inhale 2 puffs into the lungs every 6 (six) hours as needed for wheezing or shortness of breath. 1 Inhaler 2  . flecainide (TAMBOCOR) 100 MG tablet Take 1 tablet (100 mg total) by mouth 2 (two) times daily.    . Fluticasone Furoate-Vilanterol 100-25 MCG/INH AEPB Inhale 1 puff into the lungs daily. 60 each 3  . SPIRIVA HANDIHALER 18 MCG inhalation capsule USE 1 CAPSULE IN HANDIHALER DAILY 30 capsule 3   No current facility-administered medications on file prior to visit.    No Known Allergies  Past Medical History  Diagnosis Date  . Arthritis   . Asthma   . Chicken pox   . Diverticulitis   . Emphysema of lung (HCC)   . Frequent headaches   . Allergy   . Hypertension   . Arrhythmia   . History of colon polyps     Past Surgical History  Procedure Laterality Date  . Appendectomy    . Tonsillectomy    . Back surgery  ~2011    Lumbar fusion    Family History  Problem Relation Age of Onset  .  Arthritis Mother   . Hypertension Mother   . Hyperlipidemia Father   . Hypertension Father   . Alcohol abuse Brother   . Cancer Brother     Lung    Social History   Social History  . Marital Status: Married    Spouse Name: N/A  . Number of Children: 2  . Years of Education: N/A   Occupational History  . 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 Smoking status: Former Smoker -- 0.25 packs/day for 60 years    Types: Cigarettes    Quit date: 08/05/2014  . Smokeless tobacco: Former Neurosurgeon     Comment: quit x 1 month  . Alcohol Use: No  . Drug Use: Yes     Comment: on family occasions  . Sexual Activity: Not Currently   Other Topics Concern  . Not on file   Social History Narrative   Has living will   Wife, then children, should be health care POA. They are formalizing this.   Would accept resuscitation   Not sure about tube feedings    Review of Systems Doesn't sleep well due to the nocturia. Doesn't want meds Appetite is good Weight is stable Bowels are okay Had spots on left forearm---they are better now Uses walker all the time  Does ADLs, with shower chair. Wife does all instrumental ADLs    Objective:   Physical Exam  Constitutional: He appears well-developed and well-nourished. No distress.  HENT:  Mouth/Throat: Oropharynx is clear and moist. No oropharyngeal exudate.  Neck: Normal range of motion. Neck supple.  Cardiovascular: Normal rate, regular rhythm and normal heart sounds.  Exam reveals no gallop.   No murmur heard. Faint pedal pulses  Pulmonary/Chest: Effort normal. No respiratory distress. He has no wheezes. He has no rales.  Decreased breath sounds but clear  Abdominal: Soft. He exhibits no distension. There is no tenderness.  Musculoskeletal:  1+ ankle edema  Lymphadenopathy:    He has no cervical adenopathy.  Neurological:  Needs help on and off table  Skin: No rash noted.  Psychiatric:  He has a normal mood and affect. His behavior is normal.          Assessment & Plan:

## 2015-08-04 NOTE — Assessment & Plan Note (Signed)
Stopped smoking completely about a year ago Stable status now Has seen Dr Belia Heman and has had some testing---hasn't gotten all results

## 2015-08-04 NOTE — Assessment & Plan Note (Signed)
Significant nocturia He doesn't want meds

## 2015-08-04 NOTE — Assessment & Plan Note (Signed)
Apparently low burden ?no anticoagulation per Dr Graciela Husbands

## 2015-08-04 NOTE — Addendum Note (Signed)
Addended by: Sueanne Margarita on: 08/04/2015 09:54 AM   Modules accepted: Orders

## 2015-08-04 NOTE — Assessment & Plan Note (Signed)
On flecainide for this No symptoms Follows with Dr Graciela Husbands

## 2015-08-04 NOTE — Progress Notes (Signed)
Pre visit review using our clinic review tool, if applicable. No additional management support is needed unless otherwise documented below in the visit note. 

## 2015-08-21 ENCOUNTER — Ambulatory Visit: Payer: Medicare Other | Admitting: Podiatry

## 2015-09-09 ENCOUNTER — Telehealth: Payer: Self-pay | Admitting: Internal Medicine

## 2015-09-09 NOTE — Telephone Encounter (Signed)
Pt c/o Shortness Of Breath: STAT if SOB developed within the last 24 hours or pt is noticeably SOB on the phone  1. Are you currently SOB (can you hear that pt is SOB on the phone)?  Yes  2. How long have you been experiencing SOB?    3. Are you SOB when sitting or when up moving around?  Patient was moving around   4. Are you currently experiencing any other symptoms? Patient did not c/o but called to check on an appt change when his sob was noticed patient was moving around home looking for appt info and sounded sob

## 2015-09-09 NOTE — Telephone Encounter (Signed)
Called and spoke with patient - states that he called to verify appt with Dr Belia Heman for 09/29/15 and the nurse he spoke with in [place] was concerned of his breathing/SOB and contacted our office advising [place] to call to schedule Acute visit. Called patient and advised him we received a call from [place] office with concerns of increased SOB - pt states that his breathing was fine and did not need any appt - patient was audibly SOB and could not speak a few words before having to stop and take a few breaths to recover. Offered appt with Dr Sung Amabile in [facility] 09/10/15, pt refused stating that he is fine and does not need to be seen. Advised him to call back if symptoms worsen. Nothing further needed.  Will send to Dr Belia Heman as Lorain Childes.

## 2015-09-22 ENCOUNTER — Inpatient Hospital Stay
Admission: EM | Admit: 2015-09-22 | Discharge: 2015-09-29 | DRG: 291 | Disposition: A | Discharge status: Home / Self Care | Payer: Medicare Other | Attending: Internal Medicine | Admitting: Internal Medicine

## 2015-09-22 ENCOUNTER — Inpatient Hospital Stay (HOSPITAL_COMMUNITY)
Admit: 2015-09-22 | Discharge: 2015-09-22 | Disposition: A | Discharge status: Home / Self Care | Payer: Medicare Other | Attending: Internal Medicine | Admitting: Internal Medicine

## 2015-09-22 ENCOUNTER — Telehealth: Payer: Self-pay | Admitting: Internal Medicine

## 2015-09-22 ENCOUNTER — Emergency Department: Payer: Medicare Other

## 2015-09-22 ENCOUNTER — Encounter: Payer: Self-pay | Admitting: Emergency Medicine

## 2015-09-22 DIAGNOSIS — F0391 Unspecified dementia with behavioral disturbance: Secondary | ICD-10-CM | POA: Diagnosis not present

## 2015-09-22 DIAGNOSIS — I248 Other forms of acute ischemic heart disease: Secondary | ICD-10-CM | POA: Diagnosis present

## 2015-09-22 DIAGNOSIS — Z9114 Patient's other noncompliance with medication regimen: Secondary | ICD-10-CM

## 2015-09-22 DIAGNOSIS — I5033 Acute on chronic diastolic (congestive) heart failure: Secondary | ICD-10-CM

## 2015-09-22 DIAGNOSIS — Z9981 Dependence on supplemental oxygen: Secondary | ICD-10-CM

## 2015-09-22 DIAGNOSIS — Z9049 Acquired absence of other specified parts of digestive tract: Secondary | ICD-10-CM | POA: Diagnosis not present

## 2015-09-22 DIAGNOSIS — I471 Supraventricular tachycardia: Secondary | ICD-10-CM | POA: Diagnosis present

## 2015-09-22 DIAGNOSIS — I2510 Atherosclerotic heart disease of native coronary artery without angina pectoris: Secondary | ICD-10-CM | POA: Diagnosis present

## 2015-09-22 DIAGNOSIS — Z8261 Family history of arthritis: Secondary | ICD-10-CM | POA: Diagnosis not present

## 2015-09-22 DIAGNOSIS — I480 Paroxysmal atrial fibrillation: Secondary | ICD-10-CM | POA: Diagnosis present

## 2015-09-22 DIAGNOSIS — Z801 Family history of malignant neoplasm of trachea, bronchus and lung: Secondary | ICD-10-CM | POA: Diagnosis not present

## 2015-09-22 DIAGNOSIS — I340 Nonrheumatic mitral (valve) insufficiency: Secondary | ICD-10-CM | POA: Diagnosis present

## 2015-09-22 DIAGNOSIS — Z8249 Family history of ischemic heart disease and other diseases of the circulatory system: Secondary | ICD-10-CM

## 2015-09-22 DIAGNOSIS — M1990 Unspecified osteoarthritis, unspecified site: Secondary | ICD-10-CM | POA: Diagnosis present

## 2015-09-22 DIAGNOSIS — J449 Chronic obstructive pulmonary disease, unspecified: Secondary | ICD-10-CM | POA: Diagnosis present

## 2015-09-22 DIAGNOSIS — Z87891 Personal history of nicotine dependence: Secondary | ICD-10-CM | POA: Diagnosis not present

## 2015-09-22 DIAGNOSIS — R092 Respiratory arrest: Secondary | ICD-10-CM | POA: Diagnosis present

## 2015-09-22 DIAGNOSIS — R7989 Other specified abnormal findings of blood chemistry: Secondary | ICD-10-CM | POA: Diagnosis present

## 2015-09-22 DIAGNOSIS — R748 Abnormal levels of other serum enzymes: Secondary | ICD-10-CM | POA: Diagnosis not present

## 2015-09-22 DIAGNOSIS — R0681 Apnea, not elsewhere classified: Secondary | ICD-10-CM | POA: Diagnosis present

## 2015-09-22 DIAGNOSIS — Z7951 Long term (current) use of inhaled steroids: Secondary | ICD-10-CM | POA: Diagnosis not present

## 2015-09-22 DIAGNOSIS — I4891 Unspecified atrial fibrillation: Secondary | ICD-10-CM | POA: Insufficient documentation

## 2015-09-22 DIAGNOSIS — R001 Bradycardia, unspecified: Secondary | ICD-10-CM | POA: Diagnosis present

## 2015-09-22 DIAGNOSIS — Z981 Arthrodesis status: Secondary | ICD-10-CM | POA: Diagnosis not present

## 2015-09-22 DIAGNOSIS — Z66 Do not resuscitate: Secondary | ICD-10-CM | POA: Diagnosis present

## 2015-09-22 DIAGNOSIS — Z79899 Other long term (current) drug therapy: Secondary | ICD-10-CM

## 2015-09-22 DIAGNOSIS — Z86010 Personal history of colonic polyps: Secondary | ICD-10-CM

## 2015-09-22 DIAGNOSIS — I252 Old myocardial infarction: Secondary | ICD-10-CM | POA: Diagnosis not present

## 2015-09-22 DIAGNOSIS — I5023 Acute on chronic systolic (congestive) heart failure: Secondary | ICD-10-CM | POA: Diagnosis present

## 2015-09-22 DIAGNOSIS — R413 Other amnesia: Secondary | ICD-10-CM | POA: Diagnosis present

## 2015-09-22 DIAGNOSIS — R4182 Altered mental status, unspecified: Secondary | ICD-10-CM | POA: Diagnosis not present

## 2015-09-22 DIAGNOSIS — R0600 Dyspnea, unspecified: Secondary | ICD-10-CM

## 2015-09-22 DIAGNOSIS — I429 Cardiomyopathy, unspecified: Secondary | ICD-10-CM | POA: Diagnosis present

## 2015-09-22 DIAGNOSIS — R600 Localized edema: Secondary | ICD-10-CM | POA: Diagnosis not present

## 2015-09-22 DIAGNOSIS — I110 Hypertensive heart disease with heart failure: Principal | ICD-10-CM | POA: Diagnosis present

## 2015-09-22 DIAGNOSIS — I4719 Other supraventricular tachycardia: Secondary | ICD-10-CM

## 2015-09-22 DIAGNOSIS — I455 Other specified heart block: Secondary | ICD-10-CM | POA: Diagnosis not present

## 2015-09-22 DIAGNOSIS — F05 Delirium due to known physiological condition: Secondary | ICD-10-CM | POA: Diagnosis present

## 2015-09-22 DIAGNOSIS — I509 Heart failure, unspecified: Secondary | ICD-10-CM

## 2015-09-22 DIAGNOSIS — M954 Acquired deformity of chest and rib: Secondary | ICD-10-CM | POA: Diagnosis present

## 2015-09-22 DIAGNOSIS — J45909 Unspecified asthma, uncomplicated: Secondary | ICD-10-CM | POA: Diagnosis present

## 2015-09-22 DIAGNOSIS — J432 Centrilobular emphysema: Secondary | ICD-10-CM | POA: Diagnosis not present

## 2015-09-22 DIAGNOSIS — R0602 Shortness of breath: Secondary | ICD-10-CM | POA: Diagnosis not present

## 2015-09-22 DIAGNOSIS — R778 Other specified abnormalities of plasma proteins: Secondary | ICD-10-CM | POA: Diagnosis present

## 2015-09-22 DIAGNOSIS — I482 Chronic atrial fibrillation: Secondary | ICD-10-CM | POA: Diagnosis not present

## 2015-09-22 DIAGNOSIS — F0390 Unspecified dementia without behavioral disturbance: Secondary | ICD-10-CM | POA: Diagnosis not present

## 2015-09-22 DIAGNOSIS — Z9889 Other specified postprocedural states: Secondary | ICD-10-CM

## 2015-09-22 DIAGNOSIS — I959 Hypotension, unspecified: Secondary | ICD-10-CM | POA: Diagnosis present

## 2015-09-22 LAB — CBC WITH DIFFERENTIAL/PLATELET
BASOS ABS: 0.1 10*3/uL (ref 0–0.1)
BASOS PCT: 1 %
Eosinophils Absolute: 0.1 10*3/uL (ref 0–0.7)
Eosinophils Relative: 1 %
HEMATOCRIT: 42.5 % (ref 40.0–52.0)
HEMOGLOBIN: 14.2 g/dL (ref 13.0–18.0)
LYMPHS PCT: 16 %
Lymphs Abs: 1.1 10*3/uL (ref 1.0–3.6)
MCH: 31 pg (ref 26.0–34.0)
MCHC: 33.5 g/dL (ref 32.0–36.0)
MCV: 92.5 fL (ref 80.0–100.0)
Monocytes Absolute: 0.6 10*3/uL (ref 0.2–1.0)
Monocytes Relative: 9 %
NEUTROS ABS: 5.1 10*3/uL (ref 1.4–6.5)
Neutrophils Relative %: 73 %
Platelets: 208 10*3/uL (ref 150–440)
RBC: 4.59 MIL/uL (ref 4.40–5.90)
RDW: 16.9 % — ABNORMAL HIGH (ref 11.5–14.5)
WBC: 7 10*3/uL (ref 3.8–10.6)

## 2015-09-22 LAB — BASIC METABOLIC PANEL
ANION GAP: 10 (ref 5–15)
BUN: 18 mg/dL (ref 6–20)
CHLORIDE: 105 mmol/L (ref 101–111)
CO2: 19 mmol/L — ABNORMAL LOW (ref 22–32)
CREATININE: 0.84 mg/dL (ref 0.61–1.24)
Calcium: 9 mg/dL (ref 8.9–10.3)
GFR calc non Af Amer: >60 mL/min (ref >60)
Glucose, Bld: 110 mg/dL — ABNORMAL HIGH (ref 65–99)
POTASSIUM: 4.3 mmol/L (ref 3.5–5.1)
SODIUM: 134 mmol/L — AB (ref 135–145)

## 2015-09-22 LAB — TROPONIN I
TROPONIN I: 0.18 ng/mL — AB (ref <0.031)
TROPONIN I: 0.18 ng/mL — AB (ref <0.031)
Troponin I: 0.19 ng/mL — ABNORMAL HIGH (ref <0.031)

## 2015-09-22 LAB — ECHOCARDIOGRAM COMPLETE
Height: 73 in
Weight: 3056 oz

## 2015-09-22 LAB — TSH: TSH: 1.435 u[IU]/mL (ref 0.350–4.500)

## 2015-09-22 LAB — BRAIN NATRIURETIC PEPTIDE: B Natriuretic Peptide: 1442 pg/mL — ABNORMAL HIGH (ref 0.0–100.0)

## 2015-09-22 LAB — MAGNESIUM: MAGNESIUM: 2.1 mg/dL (ref 1.7–2.4)

## 2015-09-22 MED ORDER — ACETAMINOPHEN 325 MG PO TABS
650.0000 mg | ORAL_TABLET | Freq: Four times a day (QID) | ORAL | Status: DC | PRN
  Administered 2015-09-28: 650 mg via ORAL
  Filled 2015-09-22: qty 2

## 2015-09-22 MED ORDER — ONDANSETRON HCL 4 MG PO TABS: 4.0000 mg | ORAL_TABLET | Freq: Four times a day (QID) | ORAL | Status: DC | PRN

## 2015-09-22 MED ORDER — ACETAMINOPHEN 650 MG RE SUPP: 650.0000 mg | Freq: Four times a day (QID) | RECTAL | Status: DC | PRN

## 2015-09-22 MED ORDER — SODIUM CHLORIDE 0.9% FLUSH: 3.0000 mL | INTRAVENOUS | Status: DC | PRN

## 2015-09-22 MED ORDER — ONDANSETRON HCL 4 MG/2ML IJ SOLN: 4.0000 mg | Freq: Four times a day (QID) | INTRAMUSCULAR | Status: DC | PRN

## 2015-09-22 MED ORDER — FUROSEMIDE 10 MG/ML IJ SOLN
40.0000 mg | Freq: Once | INTRAMUSCULAR | Status: AC
  Administered 2015-09-22: 40 mg via INTRAVENOUS
  Filled 2015-09-22: qty 4

## 2015-09-22 MED ORDER — DIGOXIN 0.25 MG/ML IJ SOLN
0.2500 mg | Freq: Every day | INTRAMUSCULAR | Status: DC
  Administered 2015-09-22 – 2015-09-23 (×2): 0.25 mg via INTRAVENOUS
  Filled 2015-09-22 (×2): qty 1

## 2015-09-22 MED ORDER — FUROSEMIDE 10 MG/ML IJ SOLN
20.0000 mg | Freq: Two times a day (BID) | INTRAMUSCULAR | Status: DC
  Administered 2015-09-22 – 2015-09-25 (×5): 20 mg via INTRAVENOUS
  Filled 2015-09-22 (×6): qty 2

## 2015-09-22 MED ORDER — ALBUTEROL SULFATE HFA 108 (90 BASE) MCG/ACT IN AERS: 2.0000 | INHALATION_SPRAY | Freq: Four times a day (QID) | RESPIRATORY_TRACT | Status: DC | PRN

## 2015-09-22 MED ORDER — SODIUM CHLORIDE 0.9% FLUSH: 3.0000 mL | Freq: Two times a day (BID) | INTRAVENOUS | Status: DC

## 2015-09-22 MED ORDER — FLUTICASONE FUROATE-VILANTEROL 100-25 MCG/INH IN AEPB
1.0000 | INHALATION_SPRAY | Freq: Every day | RESPIRATORY_TRACT | Status: DC
  Administered 2015-09-23 – 2015-09-29 (×7): 1 via RESPIRATORY_TRACT
  Filled 2015-09-22 (×2): qty 28

## 2015-09-22 MED ORDER — METOPROLOL TARTRATE 25 MG PO TABS
12.5000 mg | ORAL_TABLET | Freq: Two times a day (BID) | ORAL | Status: DC
  Administered 2015-09-22 (×2): 12.5 mg via ORAL
  Filled 2015-09-22 (×2): qty 1

## 2015-09-22 MED ORDER — ASPIRIN 81 MG PO CHEW
324.0000 mg | CHEWABLE_TABLET | Freq: Once | ORAL | Status: AC
  Administered 2015-09-22: 324 mg via ORAL
  Filled 2015-09-22: qty 4

## 2015-09-22 MED ORDER — ALBUTEROL SULFATE (2.5 MG/3ML) 0.083% IN NEBU
2.5000 mg | INHALATION_SOLUTION | Freq: Four times a day (QID) | RESPIRATORY_TRACT | Status: DC | PRN
  Administered 2015-09-22 – 2015-09-26 (×3): 2.5 mg via RESPIRATORY_TRACT
  Filled 2015-09-22 (×3): qty 3

## 2015-09-22 MED ORDER — DILTIAZEM HCL 25 MG/5ML IV SOLN
5.0000 mg | Freq: Once | INTRAVENOUS | Status: AC
  Administered 2015-09-22: 5 mg via INTRAVENOUS
  Filled 2015-09-22: qty 5

## 2015-09-22 MED ORDER — ACETAMINOPHEN 325 MG PO TABS
650.0000 mg | ORAL_TABLET | Freq: Four times a day (QID) | ORAL | Status: DC | PRN
Start: 2015-09-22 — End: 2015-09-22

## 2015-09-22 MED ORDER — TIOTROPIUM BROMIDE MONOHYDRATE 18 MCG IN CAPS
18.0000 ug | ORAL_CAPSULE | Freq: Every day | RESPIRATORY_TRACT | Status: DC
  Administered 2015-09-23 – 2015-09-28 (×6): 18 ug via RESPIRATORY_TRACT
  Filled 2015-09-22: qty 5

## 2015-09-22 MED ORDER — TRAZODONE HCL 50 MG PO TABS: 25.0000 mg | ORAL_TABLET | Freq: Every evening | ORAL | Status: DC | PRN

## 2015-09-22 MED ORDER — ASPIRIN EC 81 MG PO TBEC
81.0000 mg | DELAYED_RELEASE_TABLET | Freq: Every day | ORAL | Status: DC
  Administered 2015-09-23 – 2015-09-29 (×7): 81 mg via ORAL
  Filled 2015-09-22 (×7): qty 1

## 2015-09-22 MED ORDER — BISACODYL 5 MG PO TBEC: 5.0000 mg | DELAYED_RELEASE_TABLET | Freq: Every day | ORAL | Status: DC | PRN

## 2015-09-22 MED ORDER — HYDROCODONE-ACETAMINOPHEN 5-325 MG PO TABS
1.0000 | ORAL_TABLET | ORAL | Status: DC | PRN
  Administered 2015-09-26 – 2015-09-29 (×2): 1 via ORAL
  Filled 2015-09-22 (×2): qty 1

## 2015-09-22 MED ORDER — DOCUSATE SODIUM 100 MG PO CAPS
100.0000 mg | ORAL_CAPSULE | Freq: Two times a day (BID) | ORAL | Status: DC
  Administered 2015-09-22 – 2015-09-29 (×12): 100 mg via ORAL
  Filled 2015-09-22 (×12): qty 1

## 2015-09-22 MED ORDER — DILTIAZEM HCL 25 MG/5ML IV SOLN
10.0000 mg | Freq: Once | INTRAVENOUS | Status: AC
  Administered 2015-09-22: 10 mg via INTRAVENOUS

## 2015-09-22 MED ORDER — HEPARIN SODIUM (PORCINE) 5000 UNIT/ML IJ SOLN
5000.0000 [IU] | Freq: Three times a day (TID) | INTRAMUSCULAR | Status: DC
  Administered 2015-09-22 – 2015-09-23 (×4): 5000 [IU] via SUBCUTANEOUS
  Filled 2015-09-22 (×4): qty 1

## 2015-09-22 MED ORDER — SODIUM CHLORIDE 0.9 % IV SOLN: 250.0000 mL | INTRAVENOUS | Status: DC | PRN

## 2015-09-22 NOTE — Telephone Encounter (Signed)
Pt c/o swelling: STAT is pt has developed SOB within 24 hours  1. How long have you been experiencing swelling? 2 days  2. Where is the swelling located? Lower leg, ankle, and foot, bilateral lower extremities  3.  Are you currently taking a "fluid pill"? No   4.  Are you currently SOB? Yes , has COPD, but not uncomfortable  5.  Have you traveled recently? no  Currently in ED, now

## 2015-09-22 NOTE — Telephone Encounter (Signed)
Will forward to Dr. Graciela Husbands as an FYI that the patient is currently in ED at [facility].

## 2015-09-22 NOTE — ED Notes (Signed)
Pt to ed with c/o sob, increased swelling in feet and legs.  Pt reports severe swelling in the last 2 days.

## 2015-09-22 NOTE — ED Notes (Signed)
REDS VEST READING= 35 CHEST RULER=8  VEST FITTING TASKS: POSTURE= sitting, with hunched back HEIGHT MARKER= tall CENTER STRIP= shifted to right  COMMENTS:approved by [employer]

## 2015-09-22 NOTE — Telephone Encounter (Signed)
Left a message at home number

## 2015-09-22 NOTE — Telephone Encounter (Signed)
I spoke with [name] and she was on her way to pick up her parents to take them to [facility] ED. [name] thinks swelling started on 09/21/15 and the swelling did not go down in legs and feet overnight.
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
## 2015-09-22 NOTE — Telephone Encounter (Signed)
PLEASE NOTE: All timestamps contained within this report are represented as [place] Standard Time. CONFIDENTIALTY NOTICE: This fax transmission is intended only for the addressee. It contains information that is legally privileged, confidential or otherwise protected from use or disclosure. If you are not the intended recipient, you are strictly prohibited from reviewing, disclosing, copying using or disseminating any of this information or taking any action in reliance on or regarding this information. If you have received this fax in error, please notify us immediately by telephone so that we can arrange for its return to [place]. Phone: [phone], Toll-Free: [phone], Fax: [phone] Page: 1 of 2 Call Id: [id number] [facility] Night - Client TELEPHONE ADVICE RECORD [facility] Patient Name: MAX Joo Gender: Male DOB: 1925/08/17 Age: 82 Y 10 M 1 D Return Phone Number: [phone] (Primary), [phone] (Secondary) Address: City/State/Zip: [place] Client [facility] Night - Client Client Site [facility] - Night Physician Tillman Abide Contact Type Call Who Is Calling Patient / Member / Family / Caregiver Call Type Triage / Clinical Caller Name Vonte Rossin Relationship To Patient Spouse Return Phone Number [phone] (Primary) Chief Complaint Leg Swelling And Edema Reason for Call Symptomatic / Request for Health Information Initial Comment Caller states that her husband's legs and feet are swollen so much that they look like they are going to pop. PreDisposition Call Doctor Translation No Nurse Assessment Nurse: Charna Elizabeth, RN, Lynden Ang Date/Time (Eastern Time): 09/22/2015 7:03:03 AM Confirm and document reason for call. If symptomatic, describe symptoms. You must click the next button to save text entered. ---Darl Pikes states that Max developed severe swelling of his feet and legs (above the  knees). No severe breathing difficulty. No chest pain. No fever. Alert and responsive. Has the patient traveled out of the country within the last 30 days? ---No Does the patient have any new or worsening symptoms? ---Yes Will a triage be completed? ---Yes Related visit to physician within the last 2 weeks? ---No Does the PT have any chronic conditions? (i.e. diabetes, asthma, etc.) ---Yes List chronic conditions. ---Unknown lung problems Is this a behavioral health or substance abuse call? ---No Guidelines Guideline Title Affirmed Question Affirmed Notes Nurse Date/Time Lamount Cohen Time) Leg Swelling and Edema Difficulty breathing at rest Crucible, RN, [facility] 09/22/2015 7:10:28 AM Disp. Time Lamount Cohen Time) Disposition Final User 09/22/2015 7:12:34 AM Go to ED Now Yes Charna Elizabeth, RN, Lynden Ang PLEASE NOTE: All timestamps contained within this report are represented as [place] Standard Time. CONFIDENTIALTY NOTICE: This fax transmission is intended only for the addressee. It contains information that is legally privileged, confidential or otherwise protected from use or disclosure. If you are not the intended recipient, you are strictly prohibited from reviewing, disclosing, copying using or disseminating any of this information or taking any action in reliance on or regarding this information. If you have received this fax in error, please notify us immediately by telephone so that we can arrange for its return to [place]. Phone: [phone], Toll-Free: [phone], Fax: [phone] Page: 2 of 2 Call Id: [id number] Caller Understands: Yes Disagree/Comply: Comply Care Advice Given Per Guideline GO TO ED NOW: You need to be seen in the Emergency Department. Go to the ER at ___________ Hospital. Leave now. Drive carefully. NOTE TO TRIAGER - DRIVING: * Another adult should drive. * If immediate transportation is not available via car or taxi, then the patient should be instructed to call [id number]. BRING  MEDICINES: * Please bring a list of  your current medicines when you go to the Emergency Department (ER). * It is also a good idea to bring the pill bottles too. This will help the doctor to make certain you are taking the right medicines and the right dose. CARE ADVICE given per Leg Swelling and Edema (Adult) guideline. Referrals [facility] - ED

## 2015-09-22 NOTE — Plan of Care (Signed)
Problem: Safety: Goal: Ability to remain free from injury will improve Outcome: Progressing Fall precautions in place  Problem: Pain Managment: Goal: General experience of comfort will improve Outcome: Progressing Prn medications  Problem: Physical Regulation: Goal: Ability to maintain clinical measurements within normal limits will improve Outcome: Progressing PT/OT evaluation pending  Problem: Tissue Perfusion: Goal: Risk factors for ineffective tissue perfusion will decrease Outcome: Progressing SQ Heparin

## 2015-09-22 NOTE — Progress Notes (Signed)
81 yo wm admitted to room 260 via stretcher from ED with CHF exacerbation.  A&O x4, gait unsteady.  Pt short of breath with exertion, 2LO 2 per Byl[place] in place.  Cardiac monitor applied and verified with Candace CNA.  Lungs diminished lower lobes bil.  SL rt wrsit patent.  4+ pitting edema noted to bil lower extremities.  2 small abraisons noted to upper back, no open areas.  Oriented to room and surroundings, POC reviewed with pt.  CB in reach, SR up x 2, bed alarm on.
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
## 2015-09-22 NOTE — Telephone Encounter (Signed)
Please check on him later today--he should have ER follow up visit at some point

## 2015-09-22 NOTE — Telephone Encounter (Signed)
Left a message with daughter, [name].
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
## 2015-09-22 NOTE — Telephone Encounter (Signed)
EC Daughter [name] called and wanted to know if her father truly did need to go to the ER.  Explained role of team health nurses and that with a disposition of ER, her father did need to go to the ER as agreed upon.  Best number to call daughter is [phone]

## 2015-09-22 NOTE — ED Notes (Signed)
Cardiology MD at bedside.

## 2015-09-23 DIAGNOSIS — I480 Paroxysmal atrial fibrillation: Secondary | ICD-10-CM

## 2015-09-23 DIAGNOSIS — R778 Other specified abnormalities of plasma proteins: Secondary | ICD-10-CM | POA: Diagnosis present

## 2015-09-23 DIAGNOSIS — I4891 Unspecified atrial fibrillation: Secondary | ICD-10-CM | POA: Insufficient documentation

## 2015-09-23 DIAGNOSIS — R7989 Other specified abnormal findings of blood chemistry: Secondary | ICD-10-CM

## 2015-09-23 LAB — BLOOD GAS, ARTERIAL
ACID-BASE EXCESS: 0.1 mmol/L (ref 0.0–3.0)
ALLENS TEST (PASS/FAIL): POSITIVE — AB
Bicarbonate: 23.8 mEq/L (ref 21.0–28.0)
FIO2: 0.44
O2 Saturation: 95.4 %
PH ART: 7.44 (ref 7.350–7.450)
Patient temperature: 37
pCO2 arterial: 35 mmHg (ref 32.0–48.0)
pO2, Arterial: 75 mmHg — ABNORMAL LOW (ref 83.0–108.0)

## 2015-09-23 LAB — BASIC METABOLIC PANEL
Anion gap: 7 (ref 5–15)
BUN: 23 mg/dL — ABNORMAL HIGH (ref 6–20)
CALCIUM: 8.9 mg/dL (ref 8.9–10.3)
CHLORIDE: 103 mmol/L (ref 101–111)
CO2: 28 mmol/L (ref 22–32)
CREATININE: 1.05 mg/dL (ref 0.61–1.24)
GFR calc Af Amer: >60 mL/min (ref >60)
GFR calc non Af Amer: >60 mL/min (ref >60)
GLUCOSE: 102 mg/dL — AB (ref 65–99)
Potassium: 3.8 mmol/L (ref 3.5–5.1)
Sodium: 138 mmol/L (ref 135–145)

## 2015-09-23 LAB — HEMOGLOBIN A1C: Hgb A1c MFr Bld: 5.4 % (ref 4.0–6.0)

## 2015-09-23 LAB — CBC
HCT: 41.6 % (ref 40.0–52.0)
Hemoglobin: 13.9 g/dL (ref 13.0–18.0)
MCH: 30.6 pg (ref 26.0–34.0)
MCHC: 33.4 g/dL (ref 32.0–36.0)
MCV: 91.8 fL (ref 80.0–100.0)
PLATELETS: 201 10*3/uL (ref 150–440)
RBC: 4.53 MIL/uL (ref 4.40–5.90)
RDW: 16.6 % — ABNORMAL HIGH (ref 11.5–14.5)
WBC: 7.2 10*3/uL (ref 3.8–10.6)

## 2015-09-23 LAB — DIGOXIN LEVEL: Digoxin Level: 0.4 ng/mL — ABNORMAL LOW (ref 0.8–2.0)

## 2015-09-23 LAB — TROPONIN I: TROPONIN I: 0.19 ng/mL — AB (ref <0.031)

## 2015-09-23 LAB — GLUCOSE, CAPILLARY: Glucose-Capillary: 99 mg/dL (ref 65–99)

## 2015-09-23 MED ORDER — ALPRAZOLAM 0.25 MG PO TABS: 0.2500 mg | ORAL_TABLET | Freq: Three times a day (TID) | ORAL | Status: DC | PRN

## 2015-09-23 MED ORDER — HALOPERIDOL LACTATE 5 MG/ML IJ SOLN
2.5000 mg | Freq: Four times a day (QID) | INTRAMUSCULAR | Status: DC | PRN
  Administered 2015-09-23: 2.5 mg via INTRAVENOUS
  Filled 2015-09-23: qty 1

## 2015-09-23 MED ORDER — ALPRAZOLAM 0.25 MG PO TABS
0.2500 mg | ORAL_TABLET | Freq: Four times a day (QID) | ORAL | Status: DC | PRN
  Administered 2015-09-23: 0.25 mg via ORAL
  Filled 2015-09-23: qty 1

## 2015-09-23 NOTE — Telephone Encounter (Signed)
According to the chart, the patient was admitted to the hospital

## 2015-09-23 NOTE — Clinical Documentation Improvement (Signed)
Internal Medicine  Can the diagnosis of Malnutrition be further specified by severity? Thank you     Document Severity - Severe(third degree), Moderate (second degree), Mild (first degree)  Other condition  Unable to clinically determine    Please exercise your independent, professional judgment when responding. A specific answer is not anticipated or expected.   Thank You, Lavonda Jumbo [employer] [place] [phone]

## 2015-09-23 NOTE — Care Management (Signed)
RNCm attempted to see patient multiple times today. Each time the cardiologist, OT and PT were in working with patient. Will follow up for assessment. OT states patient is adamant that he will not go to SNF but is agreeable to home with home health.

## 2015-09-23 NOTE — Progress Notes (Signed)
Patient ID: [name], male   DOB: February 04, 1926, 82 y.o.   MRN: [id number] [employer] PROGRESS NOTE  [name] JXB:[id number] DOB: May 03, 1926 DOA: 09/22/2015 PCP: Kenneth Abide, MD  HPI/Subjective: Patient with some cough and shortness of breath. Some swelling of lower extremities. At home he takes his medication then stops them.  Objective: Filed Vitals:   09/23/15 0812 09/23/15 1219  BP: 93/72 91/71  Pulse: 110 58  Temp: 97.8 F (36.6 C) 97.9 F (36.6 C)  Resp: 20 18    Filed Weights   09/22/15 1048 09/22/15 1355 09/23/15 0500  Weight: 86.637 kg (191 lb) 91.808 kg (202 lb 6.4 oz) 91.717 kg (202 lb 3.2 oz)    ROS: Review of Systems  Constitutional: Negative for fever and chills.  Eyes: Negative for blurred vision.  Respiratory: Positive for cough and shortness of breath.   Cardiovascular: Negative for chest pain.  Gastrointestinal: Negative for nausea, vomiting, abdominal pain, diarrhea and constipation.  Genitourinary: Negative for dysuria.  Musculoskeletal: Negative for joint pain.  Neurological: Negative for dizziness and headaches.   Exam: Physical Exam  Constitutional: He is oriented to person, place, and time.  HENT:  Nose: No mucosal edema.  Mouth/Throat: No oropharyngeal exudate or posterior oropharyngeal edema.  Eyes: Conjunctivae, EOM and lids are normal. Pupils are equal, round, and reactive to light.  Neck: No JVD present. Carotid bruit is not present. No edema present. No thyroid mass and no thyromegaly present.  Cardiovascular: S1 normal and S2 normal.  An irregular rhythm present. Exam reveals no gallop.   No murmur heard. Pulses:      Dorsalis pedis pulses are 2+ on the right side, and 2+ on the left side.  Respiratory: No respiratory distress. He has no wheezes. He has no rhonchi. He has rales in the right middle field, the right lower field, the left middle field and the left lower field.  GI: Soft. Bowel sounds are normal. There is no  tenderness.  Musculoskeletal:       Right ankle: He exhibits swelling.       Left ankle: He exhibits swelling.  Lymphadenopathy:    He has no cervical adenopathy.  Neurological: He is alert and oriented to person, place, and time. No cranial nerve deficit.  Skin: Skin is warm. No rash noted. Nails show no clubbing.  Psychiatric: He has a normal mood and affect.    Data Reviewed: Basic Metabolic Panel:  Recent Labs Lab 09/22/15 1113 09/22/15 1428 09/23/15 0519  NA 134*  --  138  K 4.3  --  3.8  CL 105  --  103  CO2 19*  --  28  GLUCOSE 110*  --  102*  BUN 18  --  23*  CREATININE 0.84  --  1.05  CALCIUM 9.0  --  8.9  MG  --  2.1  --    CBC:  Recent Labs Lab 09/22/15 1113 09/23/15 0519  WBC 7.0 7.2  NEUTROABS 5.1  --   HGB 14.2 13.9  HCT 42.5 41.6  MCV 92.5 91.8  PLT 208 201   Cardiac Enzymes:  Recent Labs Lab 09/22/15 1113 09/22/15 1428 09/22/15 1720 09/22/15 2345  TROPONINI 0.18* 0.19* 0.18* 0.19*   BNP (last 3 results)  Recent Labs  02/24/15 1409 09/22/15 1113  BNP 112.0* 1442.0*   Studies: Dg Chest Port 1 View  09/22/2015  CLINICAL DATA:  Shortness of breath with lower extremity swelling. EXAM: PORTABLE CHEST 1 VIEW COMPARISON:  02/24/2015  FINDINGS: 1122 hours. Lung volumes are low. Interstitial markings are diffusely coarsened with chronic features. Vascular congestion noted without overt airspace pulmonary edema. There is bibasilar atelectasis and a component of dependent interstitial edema cannot be excluded. No substantial pleural effusion. The cardio pericardial silhouette is enlarged. Bones are diffusely demineralized. Telemetry leads overlie the chest. IMPRESSION: Low volume film with cardiomegaly with vascular congestion. Symmetric interstitial and patchy opacity at the lung bases may be related dependent edema and/ or atelectasis. Electronically Signed   By: [name] M.D.   On: 09/22/2015 11:35    Scheduled Meds: . aspirin EC  81 mg  Oral Daily  . digoxin  0.25 mg Intravenous Daily  . docusate sodium  100 mg Oral BID  . fluticasone furoate-vilanterol  1 puff Inhalation Daily  . furosemide  20 mg Intravenous BID  . heparin  5,000 Units Subcutaneous Q8H  . metoprolol tartrate  12.5 mg Oral BID  . tiotropium  18 mcg Inhalation Daily    Assessment/Plan:  1. Acute on chronic systolic congestive heart failure. EF 25-30% with moderate mitral regurgitation. Lower extremity edema. Continue IV Lasix 20 mg IV twice a day. Metoprolol if blood pressure can handle. 2. Multifocal atrial tachycardia. Heart rate was fast this morning and received IV digoxin and heart rate came down into the 50s. Try to continue metoprolol if blood pressure can handle. Cardiology following. Cardiology considering amiodarone. 3. Elevated troponin likely secondary to demand ischemia with heart failure and arrhythmia. 4. COPD. Continue inhalers 5. Relative hypotension. With heart failure, we like blood pressure on the lower side. Hopefully with controlling heart rate blood pressure may come up.  Code Status:     Code Status Orders        Start     Ordered   09/22/15 1404  Full code   Continuous     09/22/15 1403    Code Status History    Date Active Date Inactive Code Status Order ID Comments User Context   This patient has a current code status but no historical code status.    Advance Directive Documentation        Most Recent Value   Type of Advance Directive  Healthcare Power of Attorney, Living will   Pre-existing out of facility DNR order (yellow form or pink MOST form)     "MOST" Form in Place?       Family Communication: Spoke with wife at the bedside earlier, spoke with daughter on the phone afterwards. Disposition Plan: Home once heart rate and blood pressure stabilized.  Consultants:  Cardiology  Time spent: 30 minutes  Alford Highland  [employer]

## 2015-09-23 NOTE — Progress Notes (Signed)
Patient was having SOB with exertion. Respiratory therapist  notified . Albuterol breathing treatment PRN administered  With relief. Will continue to monitor.

## 2015-09-23 NOTE — Evaluation (Signed)
Physical Therapy Evaluation Patient Details Name: [name] MRN: [id number] DOB: 1926/05/26 Today's Date: 09/23/2015   History of Present Illness  Max Suchy is a 82 y.o. male with a known history of paroxysmal A. fib is being admitted for CHF exacerbation. He has been having breathing problems last 6 months to 1 year has been gotten worse over last 1-2 days. He has also been having worsening lower shortness swelling noticed by family About 2 days ago, although likely been there for a while.   Clinical Impression  Patient admitted for increasing shortness of breath and fluid retention. Per chart review and conversation he is noted to have dementia, and does not seem to recognize his safety and balance deficits identified in this evaluation. Upon standing initially he nearly falls backwards on 2 different occasions. He has poor control of RW during turning have his RLE outside the BOS. He becomes acutely dyspnic on exertion, though O2 sats remained reasonable and returned to baseline with rest. He appears to have made a functional decline from his baseline, and given his cognitive status he does not seem to recognize this as problematic. PT recommended SNF to patient, which he declined, on 2nd ambulation bout he appeared to have less lateral sway though continued to have difficulty with balancing after standing. Would recommend SNF at this time, however if he is to return home 24/7 supervision and HHPT with no ascending/descending steps in his house would be recommended.      Follow Up Recommendations SNF (Patient informs PT he will not go to SNF, if he returns home would recommend 24/7 assistance with HHPT)    Equipment Recommendations  Rolling walker with 5" wheels    Recommendations for Other Services       Precautions / Restrictions Precautions Precautions: Fall Precaution Comments: On O2 at 2L Restrictions Weight Bearing Restrictions: No      Mobility  Bed Mobility Overal bed  mobility: Independent             General bed mobility comments: No deficits in bed mobility, however he struggles to scoot himself up in bed from supine.   Transfers Overall transfer level: Needs assistance Equipment used: Rolling walker (2 wheeled) Transfers: Sit to/from Stand Sit to Stand: Min guard         General transfer comment: Patient able to transfer sit to stand with minor if any assistance, however he loses his balance posteriorly x 2 prior to ambulating.  Ambulation/Gait Ambulation/Gait assistance: Min guard Ambulation Distance (Feet): 125 Feet (2 bouts) Assistive device: Rolling walker (2 wheeled) Gait Pattern/deviations: Step-through pattern   Gait velocity interpretation: Below normal speed for age/gender General Gait Details: Patient appears to flex RLE excessively in swing phase, it appears as though he may have neuropathy in his RLE, he reports this is baseline for him. Initially upon standing he leans posteriorly prior to ambulating and nearly loses his balance x2. Once ambulating he has ER bilaterally with wide base of support with RW. Generally unsafe with ambulation, but no buckling noted.   Stairs            Wheelchair Mobility    Modified Rankin (Stroke Patients Only)       Balance Overall balance assessment: Needs assistance Sitting-balance support: No upper extremity supported Sitting balance-Leahy Scale: Good     Standing balance support: Bilateral upper extremity supported Standing balance-Leahy Scale: Fair  Pertinent Vitals/Pain Pain Assessment: No/denies pain    Home Living Family/patient expects to be discharged to:: Private residence Living Arrangements: Spouse/significant other Available Help at Discharge: Family Type of Home: House Home Access: Stairs to enter Entrance Stairs-Rails: None Entrance Stairs-Number of Steps: 2 Home Layout: Two level;Able to live on main level with  bedroom/bathroom Home Equipment: Walker - 2 wheels;Grab bars - toilet;Shower seat      Prior Function Level of Independence: Needs assistance   Gait / Transfers Assistance Needed: FWW for ambulation in home  ADL's / Homemaking Assistance Needed: Wife assisted with meals and house keeping but he was independent in ADLs  Comments: Pt      Hand Dominance   Dominant Hand: Right    Extremity/Trunk Assessment   Upper Extremity Assessment: Overall WFL for tasks assessed (4/5 strength in BUEs and hands)           Lower Extremity Assessment: Overall WFL for tasks assessed      Cervical / Trunk Assessment: Kyphotic  Communication   Communication: HOH  Cognition Arousal/Alertness: Awake/alert Behavior During Therapy: WFL for tasks assessed/performed;Impulsive Overall Cognitive Status: History of cognitive impairments - at baseline       Memory: Decreased short-term memory              General Comments      Exercises        Assessment/Plan    PT Assessment Patient needs continued PT services  PT Diagnosis Difficulty walking;Generalized weakness   PT Problem List Decreased strength;Decreased mobility;Decreased safety awareness;Decreased activity tolerance;Cardiopulmonary status limiting activity;Decreased balance;Decreased knowledge of use of DME;Decreased knowledge of precautions  PT Treatment Interventions DME instruction;Therapeutic activities;Therapeutic exercise;Gait training;Stair training;Balance training   PT Goals (Current goals can be found in the Care Plan section) Acute Rehab PT Goals Patient Stated Goal: To return home as soon as he can PT Goal Formulation: With patient/family Time For Goal Achievement: 10/07/15 Potential to Achieve Goals: Good    Frequency Min 2X/week   Barriers to discharge Decreased caregiver support It appears patient's caregiver is also elderly and could not provide significant physical assistance if needed.      Co-evaluation               End of Session Equipment Utilized During Treatment: Gait belt;Oxygen Activity Tolerance: Patient tolerated treatment well Patient left: in bed;with bed alarm set;with call bell/phone within reach;with family/visitor present Nurse Communication: Mobility status         Time: 5520-8022 PT Time Calculation (min) (ACUTE ONLY): 24 min   Charges:   PT Evaluation $PT Eval Moderate Complexity: 1 Procedure     PT G Codes:       Kerin Ransom, PT, DPT    09/23/2015, 5:33 PM
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
## 2015-09-23 NOTE — Progress Notes (Signed)
Patient: [name] / Admit Date: 09/22/2015 / Date of Encounter: 09/23/2015, 9:54 AM   Subjective: Less SOB. Has diuresed 3.0 L so far this admission. No chest pain or palpitations.   Review of Systems: Review of Systems  Constitutional: Positive for malaise/fatigue. Negative for fever, chills, weight loss and diaphoresis.  HENT: Negative for congestion.   Eyes: Negative for discharge and redness.  Respiratory: Positive for cough, shortness of breath and wheezing. Negative for hemoptysis and sputum production.   Cardiovascular: Positive for leg swelling. Negative for chest pain, palpitations, orthopnea, claudication and PND.  Gastrointestinal: Negative for nausea, vomiting and abdominal pain.  Musculoskeletal: Negative for falls.  Skin: Negative for rash.  Neurological: Positive for weakness. Negative for dizziness, tingling, tremors, sensory change, speech change, focal weakness and loss of consciousness.  Endo/Heme/Allergies: Does not bruise/bleed easily.  Psychiatric/Behavioral: The patient is not nervous/anxious.     Objective: Telemetry: atrial tachycardia, 120's Physical Exam: Blood pressure 93/72, pulse 110, temperature 97.8 F (36.6 C), temperature source Oral, resp. rate 20, height 6\' 1"  (1.854 m), weight 202 lb 3.2 oz (91.717 kg), SpO2 94 %. Body mass index is 26.68 kg/(m^2). General: Well developed, well nourished, in no acute distress. Head: Normocephalic, atraumatic, sclera non-icteric, no xanthomas, nares are without discharge. Neck: Negative for carotid bruits. JVP elevated. Lungs: Coarse breath sounds bilaterally, with bilateral crackles along the bases. Breathing is unlabored. Heart: RRR S1 S2 without murmurs, rubs, or gallops.  Abdomen: Soft, non-tender, non-distended with normoactive bowel sounds. No rebound/guarding. Extremities: No clubbing or cyanosis. 2+ pitting edema along the LE.  Neuro: Alert and oriented X 3. Moves all extremities  spontaneously. Psych:  Responds to questions appropriately with a normal affect.   Intake/Output Summary (Last 24 hours) at 09/23/15 0954 Last data filed at 09/23/15 0810  Gross per 24 hour  Intake      0 ml  Output   3050 ml  Net  -3050 ml    Inpatient Medications:  . aspirin EC  81 mg Oral Daily  . digoxin  0.25 mg Intravenous Daily  . docusate sodium  100 mg Oral BID  . fluticasone furoate-vilanterol  1 puff Inhalation Daily  . furosemide  20 mg Intravenous BID  . heparin  5,000 Units Subcutaneous Q8H  . metoprolol tartrate  12.5 mg Oral BID  . tiotropium  18 mcg Inhalation Daily   Infusions:    Labs:  Recent Labs  09/22/15 1113 09/22/15 1428 09/23/15 0519  NA 134*  --  138  K 4.3  --  3.8  CL 105  --  103  CO2 19*  --  28  GLUCOSE 110*  --  102*  BUN 18  --  23*  CREATININE 0.84  --  1.05  CALCIUM 9.0  --  8.9  MG  --  2.1  --    No results for input(s): AST, ALT, ALKPHOS, BILITOT, PROT, ALBUMIN in the last 72 hours.  Recent Labs  09/22/15 1113 09/23/15 0519  WBC 7.0 7.2  NEUTROABS 5.1  --   HGB 14.2 13.9  HCT 42.5 41.6  MCV 92.5 91.8  PLT 208 201    Recent Labs  09/22/15 1113 09/22/15 1428 09/22/15 1720 09/22/15 2345  TROPONINI 0.18* 0.19* 0.18* 0.19*   Invalid input(s): POCBNP  Recent Labs  09/22/15 1428  HGBA1C 5.4     Weights: Filed Weights   09/22/15 1048 09/22/15 1355 09/23/15 0500  Weight: 191 lb (86.637 kg) 202 lb 6.4  oz (91.808 kg) 202 lb 3.2 oz (91.717 kg)     Radiology/Studies:  Dg Chest Port 1 View  09/22/2015  CLINICAL DATA:  Shortness of breath with lower extremity swelling. EXAM: PORTABLE CHEST 1 VIEW COMPARISON:  02/24/2015 FINDINGS: 1122 hours. Lung volumes are low. Interstitial markings are diffusely coarsened with chronic features. Vascular congestion noted without overt airspace pulmonary edema. There is bibasilar atelectasis and a component of dependent interstitial edema cannot be excluded. No substantial  pleural effusion. The cardio pericardial silhouette is enlarged. Bones are diffusely demineralized. Telemetry leads overlie the chest. IMPRESSION: Low volume film with cardiomegaly with vascular congestion. Symmetric interstitial and patchy opacity at the lung bases may be related dependent edema and/ or atelectasis. Electronically Signed   By: Kennith Center M.D.   On: 09/22/2015 11:35     Assessment and Plan   1. Acute on chronic diastolic CHF: -He continues to have significantly LE edema and elevated JVD on exam -Given his tachy-arrhythmia, he may even have a tachy-mediated cardiomyopathy at this time as well -Check echo once heart rate is better controlled -Has diuresed well in the ED with 3.0 L UOP for the admission -Continue IV Lasix as BP allows  -Lopressor 12.5 mg bid on hold given soft BP -CHF vest in the ED with reading of 35 -Consider ischemic evaluation as outpatient  2. Atrial reentrant tachycardia/PAF/SVT: -BP has been soft this morning leading to holding of metoprolol and Lasix -Would start amiodarone 400 mg daily x 1 week, then 200 mg daily thereafter for rate/rhythm control  -If able in the future could look to restart Lopressor as BP allows -Was not on any rate or rhythm controlling medications as an outpatient, though has been advised to take these by multiple cardiologists. Without compliance he will continue to have tachy-arrhythmias with possible tachy-mediated cardiomyopathy and decompensated HF exacerbations  -Monitor on telemetry  -Will need to discuss long term, full-dose anticoagulation with patient and family as he improves -Stable, no indication for emergent DCCV  3. Elevated troponin: -Mildly elevated at 0.19-->0.18-->0.19 -No symptoms of angina -Likely supply demand ischemia in the setting of numbers 1 and 2 -Can follow up as an outpatient    4. COPD: -Stable  5. Hypotension: -Monitor as HR improves -Lopressor and Lasix on hold as above  6.  Dispo: -Patient has poor insight into his chronic medical conditions    Signed, Eula Listen, PA-C Pager: [phone] 09/23/2015, 9:54 AM

## 2015-09-23 NOTE — Consult Note (Signed)
[facility] CM Inpatient Consult   09/23/2015  [name] 05/21/1926 [id number]   Met with the patient and spouse regarding the benefits of [facility] Care Management services. Explained that [facility] Care Management is a covered benefit of his Traditional Medicare insurance. Reviewed information for [facility] Care Management and a folder was provided with contact information.  Explained that [facility] Care Management does not interfere with or replace any services arranged by the inpatient care management staff.  Patient and spouse requested a return visit related to "They would like to look it over first." Made patient and spouse aware that even if discharged before RNCM can make a return visit, they may call [facility]-office or Hospital Liaison's contact information to sign up with [facility] Care Management Services. For questions please contact:    RN, BSN [facility]  Hospital Liaison  ([phone]) Business Mobile ([phone]) Toll free office     

## 2015-09-23 NOTE — Progress Notes (Signed)
Patient extremely uncooperative and combative/confused. Dr Cherlynn Kaiser notified and an order for Haldol was placed. Pt received Haldol. Code 300 called d/t patient continuing to be combative. At one point, pt became unresponsive, appeared to be breathing extremely slow and irregularly, with pulse( irregular pulse with frequent pauses). Code BLUE called. No interventions, pt awoke and again was confused/combative. [facility] contacted Dr Cherlynn Kaiser to come to bedside to discuss further mgmt of patient. Decision to have patient remain on 2A at this time, ABG and EKG ordered. Sitter at bedside, family called to come in to attempt to calm patient. Will continue to monitor.

## 2015-09-23 NOTE — Progress Notes (Signed)
I was called early on the patient as he was agitated and picking at things so I had ordered some as needed Haldol for the patient.  Shortly after the patient received some Haldol he became lethargic and a CODE BLUE was called but patient had a mild respiratory arrest but did not lose a pulse and maintained a heart rhythm.    Upon arrival patient is unresponsive to painful stimuli but does grimace to a car sternal rub.  Physical exam:  Gen - Lethargic appearing male who is encephalopathic, sedated.  Heart - S1, S2, no murmurs, rubs, clicks.  Lungs - Poor Resp. Effort, no rales, rhochi, wheezes Neuro - Lethargic, encephalopathic and responds to harsh sternal rub, but not to painful stimuli.    ECG - NSR with normal axis LAFB and no QT prolongation noted.   ABG    Component Value Date/Time   PHART 7.44 09/23/2015 2130   PCO2ART 35 09/23/2015 2130   PO2ART 75* 09/23/2015 2130   HCO3 23.8 09/23/2015 2130   TCO2 28 11/22/2007 1507   O2SAT 95.4 09/23/2015 2130    Assessment & Plan  1. AMS/delerium - likely dementia with acute delirium.   - cont. PRN haldol for agitation.   - avoid Benzo's if possible.  Sitter at bedside.   - if needed may need to transfer to ICU for Precedex gtt.   2. Abnormal Rhythm strip - on monitor pt. Has some pauses about 1 sec or so but it's short lived.  - pt. Does have a hx of Atrial tachycardia and is followed by Cardiology.  - will check Dig level.  Cardiology following. Will hold dig, Metoprolol for now and allow for some tachycardia and follow rhythm on tele.    Critical Care time spent: 40 min.

## 2015-09-23 NOTE — Consult Note (Signed)
   [facility] CM Inpatient Consult   09/23/2015  [name] 1926-01-18 [id number]   Stopped back by to speak with patient about [facility] Care Management services per patient's earlier request. Patient states he has not had a chance to look over information and does not want to sign anything until he can read the information. Answered questions and reviewed the services with patient and spouse. Made patient and spouse aware they could contact main office to connect with services. Patient and spouse verbalized understanding on how to contact [facility] Care Management.   Costella Hatcher RN, BSN [facility] Liaison  [phone]) Business Mobile [phone]) Toll free office

## 2015-09-23 NOTE — Care Management (Signed)
RNCM assessment for discharge planning. Patient lives at home with his wife. He uses a walker. No home O2. He and wife both state they are open to STR and/or  HH if needed. I do  question as to both of their comprehension of the current situation. CHF, continues IV lasix and O2 @ 2 L. Will follow up tomorrow after PT evaluation.

## 2015-09-24 DIAGNOSIS — I5023 Acute on chronic systolic (congestive) heart failure: Secondary | ICD-10-CM

## 2015-09-24 LAB — GLUCOSE, CAPILLARY
GLUCOSE-CAPILLARY: 111 mg/dL — AB (ref 65–99)
Glucose-Capillary: 113 mg/dL — ABNORMAL HIGH (ref 65–99)
Glucose-Capillary: 109 mg/dL — ABNORMAL HIGH (ref 65–99)

## 2015-09-24 LAB — BASIC METABOLIC PANEL
Anion gap: 9 (ref 5–15)
BUN: 20 mg/dL (ref 6–20)
CO2: 24 mmol/L (ref 22–32)
Calcium: 8.7 mg/dL — ABNORMAL LOW (ref 8.9–10.3)
Chloride: 104 mmol/L (ref 101–111)
Creatinine, Ser: 0.94 mg/dL (ref 0.61–1.24)
Glucose, Bld: 109 mg/dL — ABNORMAL HIGH (ref 65–99)
POTASSIUM: 3.6 mmol/L (ref 3.5–5.1)
SODIUM: 137 mmol/L (ref 135–145)

## 2015-09-24 LAB — DIGOXIN LEVEL: DIGOXIN LVL: 0.3 ng/mL — AB (ref 0.8–2.0)

## 2015-09-24 MED ORDER — ZIPRASIDONE MESYLATE 20 MG IM SOLR
5.0000 mg | Freq: Once | INTRAMUSCULAR | Status: AC
  Administered 2015-09-24: 5 mg via INTRAMUSCULAR

## 2015-09-24 MED ORDER — METOPROLOL TARTRATE 25 MG PO TABS
12.5000 mg | ORAL_TABLET | Freq: Two times a day (BID) | ORAL | Status: DC
  Administered 2015-09-24 – 2015-09-25 (×3): 12.5 mg via ORAL
  Filled 2015-09-24 (×3): qty 1

## 2015-09-24 MED ORDER — ZIPRASIDONE MESYLATE 20 MG IM SOLR
10.0000 mg | Freq: Once | INTRAMUSCULAR | Status: AC
  Administered 2015-09-24: 10 mg via INTRAMUSCULAR
  Filled 2015-09-24 (×2): qty 20

## 2015-09-24 MED ORDER — DIGOXIN 0.25 MG/ML IJ SOLN
0.1250 mg | Freq: Every day | INTRAMUSCULAR | Status: DC
  Administered 2015-09-24 – 2015-09-25 (×2): 0.125 mg via INTRAVENOUS
  Filled 2015-09-24 (×2): qty 0.5

## 2015-09-24 MED ORDER — ZIPRASIDONE MESYLATE 20 MG IM SOLR
5.0000 mg | Freq: Once | INTRAMUSCULAR | Status: AC
  Administered 2015-09-25: 5 mg via INTRAMUSCULAR
  Filled 2015-09-24: qty 20

## 2015-09-24 MED ORDER — DIGOXIN 250 MCG PO TABS
0.2500 mg | ORAL_TABLET | Freq: Once | ORAL | Status: AC
  Administered 2015-09-24: 0.25 mg via ORAL
  Filled 2015-09-24: qty 1

## 2015-09-24 MED ORDER — QUETIAPINE FUMARATE 25 MG PO TABS
25.0000 mg | ORAL_TABLET | Freq: Every day | ORAL | Status: DC
  Administered 2015-09-25 – 2015-09-28 (×4): 25 mg via ORAL
  Filled 2015-09-24 (×5): qty 1

## 2015-09-24 MED ORDER — ENOXAPARIN SODIUM 40 MG/0.4ML ~~LOC~~ SOLN
40.0000 mg | SUBCUTANEOUS | Status: DC
  Administered 2015-09-24 – 2015-09-28 (×5): 40 mg via SUBCUTANEOUS
  Filled 2015-09-24 (×5): qty 0.4

## 2015-09-24 MED ORDER — ZIPRASIDONE MESYLATE 20 MG IM SOLR
10.0000 mg | Freq: Once | INTRAMUSCULAR | Status: AC
  Administered 2015-09-24: 10 mg via INTRAMUSCULAR
  Filled 2015-09-24: qty 20

## 2015-09-24 NOTE — Care Management Important Message (Signed)
Important Message  Patient Details  Name: [name] MRN: [id number] Date of Birth: 08/24/1925   Medicare Important Message Given:  Yes    [name] A [name] 09/24/2015, 11:05 AM

## 2015-09-24 NOTE — Progress Notes (Signed)
PT Cancellation Note  Patient Details Name: [name] MRN: [id number] DOB: May 16, 1926   Cancelled Treatment:    Reason Eval/Treat Not Completed: Medical issues which prohibited therapy. Chart reviewed and then code 300 over head paged minutes later for patient's room. Arrived at the floor to talk with nurse who reports that pt has been agitated and aggressive with staff. Not currently appropriate for PT. Will attempt PT on later date/time as pt is appropriate.  Sharalyn Ink Ninel Abdella PT, DPT   Sanuel Ladnier 09/24/2015, 4:19 PM
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
## 2015-09-24 NOTE — ED Provider Notes (Signed)
[facility]  Department of Emergency Medicine   Code Blue CONSULT NOTE  Chief Complaint: Apneic  Level V Caveat: Unresponsive  History of present illness: I was contacted by the hospital for a CODE BLUE cardiac arrest upstairs and presented to the patient's bedside at approx 2100.  Per nursing staff, pt had been combative since their shift began at 7p.  Pt had received xanax shortly before that time.  He was then given Haldol 2.5mg  IV just prior to being found apeic.  On my arrival, RT was checking SaO2 & pt remained with SaO2 in mid-90s throughout episode.  He had just woken up swinging his arms on my arrival, then quickly fell back asleep, but would arouse intermittently.    ROS: Unable to obtain, Level V caveat  Scheduled Meds: . aspirin EC  81 mg Oral Daily  . docusate sodium  100 mg Oral BID  . fluticasone furoate-vilanterol  1 puff Inhalation Daily  . furosemide  20 mg Intravenous BID  . heparin  5,000 Units Subcutaneous Q8H  . tiotropium  18 mcg Inhalation Daily   Continuous Infusions:  PRN Meds:.acetaminophen **OR** acetaminophen, albuterol, ALPRAZolam, bisacodyl, haloperidol lactate, HYDROcodone-acetaminophen, ondansetron **OR** ondansetron (ZOFRAN) IV, traZODone Past Medical History  Diagnosis Date  . Arthritis   . Asthma   . Chicken pox   . Diverticulitis   . Emphysema of lung (HCC)   . Frequent headaches   . Allergy   . Hypertension   . Arrhythmia   . History of colon polyps    Past Surgical History  Procedure Laterality Date  . Appendectomy    . Tonsillectomy    . Back surgery  ~2011    Lumbar fusion   Social History   Social History  . Marital Status: Married    Spouse Name: N/A  . Number of Children: 2  . Years of Education: N/A   Occupational History  . 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 Smoking status: Former Smoker -- 0.25 packs/day for 60 years    Types: Cigarettes    Quit date: 08/05/2014  . Smokeless tobacco: Former Neurosurgeon     Comment: quit x 1 month  . Alcohol Use: No  . Drug Use: Yes     Comment: on family occasions  . Sexual Activity: Not Currently   Other Topics Concern  . Not on file   Social History Narrative   Has living will   Wife, then children, should be health care POA. They are formalizing this.   Would accept resuscitation   Not sure about tube feedings    No Known Allergies  Last set of Vital Signs (not current) Filed Vitals:   09/23/15 1724 09/23/15 1922  BP: 100/71 101/49  Pulse: 56 49  Temp:  98 F (36.7 C)  Resp:  16      Physical Exam Gen: unresponsive initially, then awoke & swung arms & spoke briefly Cardiovascular: distal pulses 2+ B Resp: protecting airway; normal WOB Skin: warm  Medical Decision making Pt Sx are most likely related to medication/sedation.  Prime doc, Dr. Cherlynn Kaiser, alerted & will come to assess pt.  Also noted are periods of tachycardia to 120s with occasional pauses & episodes of bradycardia.  EKG with rhythm strip requested; Dr. Cherlynn Kaiser will review   Assessment and Plan  Sedation resulting from medication   Maurilio Lovely, MD 09/24/15 0150

## 2015-09-24 NOTE — Progress Notes (Signed)
Patient: [name] / Admit Date: 09/22/2015 / Date of Encounter: 09/24/2015, 8:01 AM   Subjective: UOP of 4.5 L for the admission. Confused and agitated overnight requiring Haldol. Echo from this admission read on 4/19 as EF 25-30%, severe HK of the anterior, anteroseptal and apical myocardium, study not technically sufficient to allow for LV diastolic function, moderate MR, mild biatrial enlargement, RV mildly dilated with mildly reduced systolic function, PASP 51 mm Hg. Felt to be old anterior MI.    Review of Systems: Review of Systems  Constitutional: Positive for malaise/fatigue. Negative for fever, chills, weight loss and diaphoresis.  HENT: Negative for congestion.   Eyes: Negative for discharge and redness.  Respiratory: Positive for shortness of breath. Negative for cough, hemoptysis, sputum production and wheezing.   Cardiovascular: Positive for leg swelling. Negative for chest pain, palpitations, orthopnea, claudication and PND.  Gastrointestinal: Negative for nausea, vomiting and abdominal pain.  Musculoskeletal: Negative for myalgias and falls.  Skin: Negative for rash.  Neurological: Positive for weakness. Negative for dizziness, tingling, tremors, sensory change, speech change, focal weakness and loss of consciousness.  Endo/Heme/Allergies: Does not bruise/bleed easily.  Psychiatric/Behavioral: The patient is not nervous/anxious.     Objective: Telemetry: atrial tachycardia, 120's currently, 80's bpm 10 PM, 50's bpm around 5 PM Physical Exam: Blood pressure 110/81, pulse 131, temperature 97.6 F (36.4 C), temperature source Oral, resp. rate 20, height 6\' 1"  (1.854 m), weight 202 lb 3.2 oz (91.717 kg), SpO2 80 %. Body mass index is 26.68 kg/(m^2). General: Well developed, well nourished, in no acute distress. Head: Normocephalic, atraumatic, sclera non-icteric, no xanthomas, nares are without discharge. Neck: Negative for carotid bruits. JVP elevated. Lungs: Coarse  breath sounds bilaterally with bilateral crackles along the bases. Breathing is unlabored. On nasal cannula.  Heart: Tachycardic, S1 S2 without murmurs, rubs, or gallops.  Abdomen: Soft, non-tender, non-distended with normoactive bowel sounds. No rebound/guarding. Extremities: No clubbing or cyanosis. 1+ pitting edema to the bilateral knees. Neuro: Alert and oriented X 3. Moves all extremities spontaneously. Psych:  Responds to questions appropriately with a normal affect.   Intake/Output Summary (Last 24 hours) at 09/24/15 0801 Last data filed at 09/24/15 0500  Gross per 24 hour  Intake    120 ml  Output   1850 ml  Net  -1730 ml    Inpatient Medications:  . aspirin EC  81 mg Oral Daily  . docusate sodium  100 mg Oral BID  . fluticasone furoate-vilanterol  1 puff Inhalation Daily  . furosemide  20 mg Intravenous BID  . heparin  5,000 Units Subcutaneous Q8H  . tiotropium  18 mcg Inhalation Daily   Infusions:    Labs:  Recent Labs  09/22/15 1428 09/23/15 0519 09/24/15 0523  NA  --  138 137  K  --  3.8 3.6  CL  --  103 104  CO2  --  28 24  GLUCOSE  --  102* 109*  BUN  --  23* 20  CREATININE  --  1.05 0.94  CALCIUM  --  8.9 8.7*  MG 2.1  --   --    No results for input(s): AST, ALT, ALKPHOS, BILITOT, PROT, ALBUMIN in the last 72 hours.  Recent Labs  09/22/15 1113 09/23/15 0519  WBC 7.0 7.2  NEUTROABS 5.1  --   HGB 14.2 13.9  HCT 42.5 41.6  MCV 92.5 91.8  PLT 208 201    Recent Labs  09/22/15 1113 09/22/15 1428 09/22/15  1720 09/22/15 2345  TROPONINI 0.18* 0.19* 0.18* 0.19*   Invalid input(s): POCBNP  Recent Labs  09/22/15 1428  HGBA1C 5.4     Weights: Filed Weights   09/22/15 1048 09/22/15 1355 09/23/15 0500  Weight: 191 lb (86.637 kg) 202 lb 6.4 oz (91.808 kg) 202 lb 3.2 oz (91.717 kg)     Radiology/Studies:  Dg Chest Port 1 View  09/22/2015  CLINICAL DATA:  Shortness of breath with lower extremity swelling. EXAM: PORTABLE CHEST 1 VIEW  COMPARISON:  02/24/2015 FINDINGS: 1122 hours. Lung volumes are low. Interstitial markings are diffusely coarsened with chronic features. Vascular congestion noted without overt airspace pulmonary edema. There is bibasilar atelectasis and a component of dependent interstitial edema cannot be excluded. No substantial pleural effusion. The cardio pericardial silhouette is enlarged. Bones are diffusely demineralized. Telemetry leads overlie the chest. IMPRESSION: Low volume film with cardiomegaly with vascular congestion. Symmetric interstitial and patchy opacity at the lung bases may be related dependent edema and/ or atelectasis. Electronically Signed   By: Kennith Center M.D.   On: 09/22/2015 11:35     Assessment and Plan   1. Acute on chronic combined CHF: -Echo as above with newly found EF 25-30%, severe HK of the anterior, anteroseptal and apical myocardium, study not technically sufficient to allow for LV diastolic function, moderate MR, mild biatrial enlargement, RV mildly dilated with mildly reduced systolic function, PASP 51 mm Hg  -Felt to be old anterior MI -He continues to have significantly LE edema and elevated JVD on exam -Has diuresed well in the ED with 4.5 L UOP for the admission as of 4/20 AM -Continue IV Lasix as BP allows, only able to receive 1 dose on 4/19 secondary to soft BP  -Lopressor 12.5 mg bid on hold given soft BP, would look to change to Toprol or Coreg as BP allow given his cardiomyopathy -As BP allows would start either ACEi or Entresto given the above EF -CHF vest in the ED with reading of 35 -CHF education provided  -Consider ischemic evaluation as outpatient  2. Atrial reentrant tachycardia/PAF/SVT: -BP has been soft this morning leading to holding of metoprolol and Lasix -Patient with brisk response to IV digoxin on 4/19 with heart rates going from 120s to 40s bpm. Thus, he was not started on amiodarone for this reason, and would be hesitant to use amiodarone in  the future because of this -Status post IV digoxin 0.25 mg x 2 with good heart rate response -Digoxin level 0.3 as of 4/20 AM -Had 1 second pause over night, digoxin was held, now tachycardic in the 120's bpm -Continue low-dose IV digoxin -If able in the future could look to restart Lopressor as BP allows -Was not on any rate or rhythm controlling medications as an outpatient, though has been advised to take these by multiple cardiologists. Without compliance he will continue to have tachy-arrhythmias and decompensated HF exacerbations  -Monitor on telemetry  -Will need to discuss long term, full-dose anticoagulation with patient and family as he improves as his rhythm does appear to be Afib, though at times there are clear P waves, given him other non-Afib rhythms -Consider MAT/WAP -Stable, no indication for emergent DCCV  3. Elevated troponin/CAD with history of prior anterior wall MI seen on echo as above: -Mildly elevated at 0.19-->0.18-->0.19 -No symptoms of angina -Likely supply demand ischemia in the setting of numbers 1 and 2 -No plans for ischemic evaluation at this time given his volume overload, dementia, medication noncompliance, and  poor insight to his medical conditions -Could pursue ischemic evaluation as an outpatient   4. COPD: -Stable  5. Hypotension: -Monitor as HR improves -Lopressor and Lasix as above  6. Dispo: -Patient has poor insight into his chronic medical conditions    Signed, Eula Listen, PA-C Pager: [phone] 09/24/2015, 8:01 AM

## 2015-09-24 NOTE — Progress Notes (Signed)
Pt is combative, swinging at staff, trying to get up and leave the hospital. Code 300 called.  MD notified. Orders for geodon placed and given. I will continue to assess.

## 2015-09-24 NOTE — Progress Notes (Signed)
MD notified. Pt is combative, repeatedly trying to get out of bed, punching and pinching staff. MD orders one time dose of geodon. I will continue to assess.

## 2015-09-24 NOTE — Progress Notes (Signed)
OT Cancellation Note  Patient Details Name: [name] MRN: [id number] DOB: [birth year]   Cancelled Treatment:     Chart reviewed prior to attempting therapy this date however, it appears patient is combative this date, will hold tx today and reattempt next date.   Amy T Lovett, OTR/L, CLT   Lovett,Amy 09/24/2015, 2:51 PM

## 2015-09-24 NOTE — Progress Notes (Signed)
Patient becoming very anxious and combative. Has hit the sitter and torn off telemetry. Dr. Renae Gloss paged and orders received for IM geodon. Family has just now arrived at bedside and purpose and side effects of geodon discussed. Both daughter and patients wife verbalized that they were okay with this medication and expressed thanks to this nurse that this was all discussed with them.

## 2015-09-24 NOTE — Progress Notes (Signed)
Pt is alert and oriented x1, no complaints of pain, Pt has been combative and abusive to staff verbally and physically. Pt treated with geodon with positive result. Pt has a low tolerance to benzos per family.

## 2015-09-25 LAB — BASIC METABOLIC PANEL
Anion gap: 7 (ref 5–15)
BUN: 19 mg/dL (ref 6–20)
CO2: 30 mmol/L (ref 22–32)
CREATININE: 0.9 mg/dL (ref 0.61–1.24)
Calcium: 8.7 mg/dL — ABNORMAL LOW (ref 8.9–10.3)
Chloride: 99 mmol/L — ABNORMAL LOW (ref 101–111)
GFR calc Af Amer: >60 mL/min (ref >60)
GLUCOSE: 96 mg/dL (ref 65–99)
POTASSIUM: 3.4 mmol/L — AB (ref 3.5–5.1)
Sodium: 136 mmol/L (ref 135–145)

## 2015-09-25 LAB — GLUCOSE, CAPILLARY
GLUCOSE-CAPILLARY: 115 mg/dL — AB (ref 65–99)
Glucose-Capillary: 144 mg/dL — ABNORMAL HIGH (ref 65–99)
Glucose-Capillary: 116 mg/dL — ABNORMAL HIGH (ref 65–99)

## 2015-09-25 MED ORDER — FUROSEMIDE 40 MG PO TABS
40.0000 mg | ORAL_TABLET | Freq: Two times a day (BID) | ORAL | Status: DC
  Administered 2015-09-25 – 2015-09-26 (×2): 40 mg via ORAL
  Filled 2015-09-25 (×2): qty 1

## 2015-09-25 MED ORDER — POTASSIUM CHLORIDE CRYS ER 20 MEQ PO TBCR
20.0000 meq | EXTENDED_RELEASE_TABLET | Freq: Two times a day (BID) | ORAL | Status: DC
  Administered 2015-09-25 – 2015-09-26 (×3): 20 meq via ORAL
  Filled 2015-09-25 (×3): qty 1

## 2015-09-25 MED ORDER — AMIODARONE HCL 200 MG PO TABS
200.0000 mg | ORAL_TABLET | Freq: Two times a day (BID) | ORAL | Status: DC
  Administered 2015-09-25 – 2015-09-29 (×9): 200 mg via ORAL
  Filled 2015-09-25 (×9): qty 1

## 2015-09-25 MED ORDER — AMIODARONE HCL 200 MG PO TABS: 400.0000 mg | ORAL_TABLET | Freq: Two times a day (BID) | ORAL | Status: DC

## 2015-09-25 NOTE — Progress Notes (Signed)
EKG completed and placed on pts chart.  

## 2015-09-25 NOTE — Progress Notes (Signed)
Initial Heart Failure Clinic appointment scheduled for Oct 09, 2015 at 9:00am. Thank you for the referral.

## 2015-09-25 NOTE — Progress Notes (Signed)
MD Kirke Corin was notified of brady 45 and new order to give amio 400 mg bid. Change dose to 200 mg bid. No further orders.

## 2015-09-25 NOTE — Clinical Social Work Note (Signed)
RN CM had consulted CSW for STR placement. PT has recommended STR but patient was unable to take for ambulation due to BP. It is unclear if patient can ambulate at this time. Patient has been confused and agitated here at hospital. Patient's behaviors may limit patient's placement if this is what family desires. York Spaniel MSW,LCSW [phone]

## 2015-09-25 NOTE — Progress Notes (Signed)
Patient ID: [name], male   DOB: July 03, 1925, 82 y.o.   MRN: [id number] [employer] PROGRESS NOTE  ASON CASTROGIOVANNI KDT:[id number] DOB: 1926/05/25 DOA: 09/22/2015 PCP: Tillman Abide, MD  HPI/Subjective: Patient feeling okay. He was  Objective: Filed Vitals:   09/25/15 1018 09/25/15 1122  BP: 96/45 92/52  Pulse: 77 65  Temp: 98.1 F (36.7 C) 97.5 F (36.4 C)  Resp: 22 22    Filed Weights   09/23/15 0500 09/24/15 1117 09/25/15 0504  Weight: 91.717 kg (202 lb 3.2 oz) 89.313 kg (196 lb 14.4 oz) 86.728 kg (191 lb 3.2 oz)    ROS: Review of Systems  Constitutional: Negative for fever and chills.  Eyes: Negative for blurred vision.  Respiratory: Positive for cough and shortness of breath.   Cardiovascular: Negative for chest pain.  Gastrointestinal: Negative for nausea, vomiting, abdominal pain, diarrhea and constipation.  Genitourinary: Negative for dysuria.  Musculoskeletal: Negative for joint pain.  Neurological: Negative for dizziness and headaches.   Exam: Physical Exam  Constitutional: He is oriented to person, place, and time.  HENT:  Nose: No mucosal edema.  Mouth/Throat: No oropharyngeal exudate or posterior oropharyngeal edema.  Eyes: Conjunctivae, EOM and lids are normal. Pupils are equal, round, and reactive to light.  Neck: No JVD present. Carotid bruit is not present. No edema present. No thyroid mass and no thyromegaly present.  Cardiovascular: S1 normal and S2 normal.  An irregular rhythm present. Tachycardia present.  Exam reveals no gallop.   No murmur heard. Pulses:      Dorsalis pedis pulses are 2+ on the right side, and 2+ on the left side.  Respiratory: No respiratory distress. He has no wheezes. He has no rhonchi. He has rales in the right middle field, the right lower field, the left middle field and the left lower field.  GI: Soft. Bowel sounds are normal. There is no tenderness.  Musculoskeletal:       Right ankle: He exhibits swelling.       Left  ankle: He exhibits swelling.  Lymphadenopathy:    He has no cervical adenopathy.  Neurological: He is alert and oriented to person, place, and time. No cranial nerve deficit.  Skin: Skin is warm. No rash noted. Nails show no clubbing.  Psychiatric: He has a normal mood and affect.    Data Reviewed: Basic Metabolic Panel:  Recent Labs Lab 09/22/15 1113 09/22/15 1428 09/23/15 0519 09/24/15 0523 09/25/15 0511  NA 134*  --  138 137 136  K 4.3  --  3.8 3.6 3.4*  CL 105  --  103 104 99*  CO2 19*  --  28 24 30   GLUCOSE 110*  --  102* 109* 96  BUN 18  --  23* 20 19  CREATININE 0.84  --  1.05 0.94 0.90  CALCIUM 9.0  --  8.9 8.7* 8.7*  MG  --  2.1  --   --   --    CBC:  Recent Labs Lab 09/22/15 1113 09/23/15 0519  WBC 7.0 7.2  NEUTROABS 5.1  --   HGB 14.2 13.9  HCT 42.5 41.6  MCV 92.5 91.8  PLT 208 201   Cardiac Enzymes:  Recent Labs Lab 09/22/15 1113 09/22/15 1428 09/22/15 1720 09/22/15 2345  TROPONINI 0.18* 0.19* 0.18* 0.19*   BNP (last 3 results)  Recent Labs  02/24/15 1409 09/22/15 1113  BNP 112.0* 1442.0*    Scheduled Meds: . amiodarone  200 mg Oral BID  . aspirin  EC  81 mg Oral Daily  . docusate sodium  100 mg Oral BID  . enoxaparin (LOVENOX) injection  40 mg Subcutaneous Q24H  . fluticasone furoate-vilanterol  1 puff Inhalation Daily  . furosemide  40 mg Oral BID  . metoprolol tartrate  12.5 mg Oral BID  . potassium chloride  20 mEq Oral BID  . QUEtiapine  25 mg Oral QHS  . tiotropium  18 mcg Inhalation Daily    Assessment/Plan:  1. Acute on chronic systolic congestive heart failure. EF 25-30% with moderate mitral regurgitation. Lower extremity edema. Lasix switched to 40 mg oral twice a day. Continue metoprolol of blood pressure able to tolerate. 2. Multifocal atrial tachycardia, atrial fibrillation. Patient switched over to oral amiodarone today. Digoxin was stopped. 3. Elevated troponin likely secondary to demand ischemia with heart failure  and arrhythmia. 4. COPD. Continue inhalers 5. Relative hypotension. Blood pressure still on the lower side 6. Agitation yesterday afternoon requiring Geodon. I gave Seroquel 25 mg at night to hopefully avoid the agitation this afternoon.  Code Status:     Code Status Orders        Start     Ordered   09/22/15 1404  Full code   Continuous     09/22/15 1403    Code Status History    Date Active Date Inactive Code Status Order ID Comments User Context   This patient has a current code status but no historical code status.    Advance Directive Documentation        Most Recent Value   Type of Advance Directive  Healthcare Power of Attorney, Living will   Pre-existing out of facility DNR order (yellow form or pink MOST form)     "MOST" Form in Place?       Family Communication: Spoke with wife and patient's daughter on the phone Disposition Plan: Home potentially over the weekend  Consultants:  Cardiology  Time spent: 24 minutes  Alford Highland  [employer]

## 2015-09-25 NOTE — Progress Notes (Signed)
MD Arida notified of bp 108/53 and hr 40s. No further orders. Only notify if HR lower than 40 and SBP less than 90s.

## 2015-09-25 NOTE — Progress Notes (Signed)
Patient: [name] / Admit Date: 09/22/2015 / Date of Encounter: 09/25/2015, 9:10 AM   Subjective: He is -5.6 L since admission and his weight went down from 202 pounds to 191 pounds. Echo from this admission showed EF 25-30%, severe HK of the anterior, anteroseptal and apical myocardium, study not technically sufficient to allow for LV diastolic function, moderate MR, mild biatrial enlargement, RV mildly dilated with mildly reduced systolic function, PASP 51 mm Hg. Felt to be old anterior MI.  He continues to have episodes of atrial tachycardia   Review of Systems: Review of Systems  Constitutional: Positive for malaise/fatigue. Negative for fever, chills, weight loss and diaphoresis.  HENT: Negative for congestion.   Eyes: Negative for discharge and redness.  Respiratory: Positive for shortness of breath. Negative for cough, hemoptysis, sputum production and wheezing.   Cardiovascular: Positive for leg swelling. Negative for chest pain, palpitations, orthopnea, claudication and PND.  Gastrointestinal: Negative for nausea, vomiting and abdominal pain.  Musculoskeletal: Negative for myalgias and falls.  Skin: Negative for rash.  Neurological: Positive for weakness. Negative for dizziness, tingling, tremors, sensory change, speech change, focal weakness and loss of consciousness.  Endo/Heme/Allergies: Does not bruise/bleed easily.  Psychiatric/Behavioral: The patient is not nervous/anxious.     Objective: Telemetry: atrial tachycardia, 120's currently, 80's bpm 10 PM, 50's bpm around 5 PM Physical Exam: Blood pressure 97/69, pulse 114, temperature 97.5 F (36.4 C), temperature source Oral, resp. rate 20, height 6\' 1"  (1.854 m), weight 191 lb 3.2 oz (86.728 kg), SpO2 92 %. Body mass index is 25.23 kg/(m^2). General: Well developed, well nourished, in no acute distress. Head: Normocephalic, atraumatic, sclera non-icteric, no xanthomas, nares are without discharge. Neck: Negative for  carotid bruits. JVP only mildly elevated. Lungs: Coarse breath sounds bilaterally with improvement since yesterday. Breathing is unlabored. On nasal cannula.  Heart: Tachycardic, S1 S2 without murmurs, rubs, or gallops.  Abdomen: Soft, non-tender, non-distended with normoactive bowel sounds. No rebound/guarding. Extremities: No clubbing or cyanosis. Trace edema to the bilateral knees. Neuro: Alert and oriented X 3. Moves all extremities spontaneously. Psych:  Responds to questions appropriately with a normal affect.   Intake/Output Summary (Last 24 hours) at 09/25/15 0910 Last data filed at 09/24/15 1930  Gross per 24 hour  Intake    120 ml  Output    950 ml  Net   -830 ml    Inpatient Medications:  . amiodarone  400 mg Oral BID  . aspirin EC  81 mg Oral Daily  . docusate sodium  100 mg Oral BID  . enoxaparin (LOVENOX) injection  40 mg Subcutaneous Q24H  . fluticasone furoate-vilanterol  1 puff Inhalation Daily  . furosemide  40 mg Oral BID  . metoprolol tartrate  12.5 mg Oral BID  . potassium chloride  20 mEq Oral BID  . QUEtiapine  25 mg Oral QHS  . tiotropium  18 mcg Inhalation Daily   Infusions:    Labs:  Recent Labs  09/22/15 1428  09/24/15 0523 09/25/15 0511  NA  --   < > 137 136  K  --   < > 3.6 3.4*  CL  --   < > 104 99*  CO2  --   < > 24 30  GLUCOSE  --   < > 109* 96  BUN  --   < > 20 19  CREATININE  --   < > 0.94 0.90  CALCIUM  --   < > 8.7* 8.7*  MG 2.1  --   --   --   < > = values in this interval not displayed. No results for input(s): AST, ALT, ALKPHOS, BILITOT, PROT, ALBUMIN in the last 72 hours.  Recent Labs  09/22/15 1113 09/23/15 0519  WBC 7.0 7.2  NEUTROABS 5.1  --   HGB 14.2 13.9  HCT 42.5 41.6  MCV 92.5 91.8  PLT 208 201    Recent Labs  09/22/15 1113 09/22/15 1428 09/22/15 1720 09/22/15 2345  TROPONINI 0.18* 0.19* 0.18* 0.19*   Invalid input(s): POCBNP  Recent Labs  09/22/15 1428  HGBA1C 5.4     Weights: Filed Weights    09/23/15 0500 09/24/15 1117 09/25/15 0504  Weight: 202 lb 3.2 oz (91.717 kg) 196 lb 14.4 oz (89.313 kg) 191 lb 3.2 oz (86.728 kg)     Radiology/Studies:  Dg Chest Port 1 View  09/22/2015  CLINICAL DATA:  Shortness of breath with lower extremity swelling. EXAM: PORTABLE CHEST 1 VIEW COMPARISON:  02/24/2015 FINDINGS: 1122 hours. Lung volumes are low. Interstitial markings are diffusely coarsened with chronic features. Vascular congestion noted without overt airspace pulmonary edema. There is bibasilar atelectasis and a component of dependent interstitial edema cannot be excluded. No substantial pleural effusion. The cardio pericardial silhouette is enlarged. Bones are diffusely demineralized. Telemetry leads overlie the chest. IMPRESSION: Low volume film with cardiomegaly with vascular congestion. Symmetric interstitial and patchy opacity at the lung bases may be related dependent edema and/ or atelectasis. Electronically Signed   By: Kennith Center M.D.   On: 09/22/2015 11:35     Assessment and Plan   1. Acute on chronic combined CHF: -Echo as above with newly found EF 25-30%, severe HK of the anterior, anteroseptal and apical myocardium, study not technically sufficient to allow for LV diastolic function, moderate MR, mild biatrial enlargement, RV mildly dilated with mildly reduced systolic function, PASP 51 mm Hg  -Felt to be old anterior MI - Volume overload improved significantly. I switched Lasix to 40 mg by mouth twice daily which can likely be decreased to 40 mg once daily upon discharge. -Continue small dose metoprolol. This cannot be increased due to hypotension.  Not able to start an ACE inhibitor or ARB due to hypotension.   2. Paroxysmal atrial tachycardia: - I don't see clear evidence of atrial fibrillation. P waves are visible throughout his tachycardic episodes. The tachycardia seems to be due to atrial tachycardia. He continues to be significantly tachycardic and did not respond  well to digoxin. Not able to increase metoprolol due to hypotension. Thus, I elected to start him on amiodarone 400 mg twice daily to see if he can suppress his arrhythmia. This should be continued at the current dose for 5 days then decrease to 200 mg twice daily.  3. Elevated troponin/CAD with history of prior anterior wall MI seen on echo as above: -Mildly elevated at 0.19-->0.18-->0.19 -No symptoms of angina -Likely supply demand ischemia in the setting of numbers 1 and 2 - Given his age and comorbidities, I don't think he is a good candidate for ischemic evaluation. This can be readdressed outpatient.  4. COPD: -Stable     Signed, Lorine Bears, MD  09/25/2015, 9:10 AM

## 2015-09-25 NOTE — Progress Notes (Signed)
Confused. Sitter at the bedside. Pt takes meds ok. Urinal. Pt has no further concerns at this time.

## 2015-09-25 NOTE — Progress Notes (Signed)
Physical Therapy Treatment Patient Details Name: [name] MRN: [id number] DOB: [birth year] Today's Date: 09/25/2015    History of Present Illness      PT Comments    Pt in bed awake.  CNA in room stating pt was anxious to walk.  Vitals taken 92/52 in supine.  Pt stating he is going home and asking writer to pack up his bags.  Pt sat edge of bed with use of rail independantly.  Pt encouraged to sit bedside and BP was taken 93/56.  Pt stood with min guard - unsteady.  Pt attempted to ambulate but was stopped by writer BP 92/55.  O2 sats monitored through out 87-92% pt then began to transfer to chair when I stopped ambulation. Pulse rate monitored through out with portable monitor reading 94.  Pt returned to bed due to telemetry HR reading low.  Pt confused during session telling his wife when she called that he was on is way to the car.     Follow Up Recommendations  SNF     Equipment Recommendations       Recommendations for Other Services       Precautions / Restrictions Restrictions Weight Bearing Restrictions: No    Mobility  Bed Mobility Overal bed mobility: Modified Independent                Transfers Overall transfer level: Needs assistance Equipment used: Rolling walker (2 wheeled) Transfers: Sit to/from Stand Sit to Stand: Min assist            Ambulation/Gait                 Stairs            Wheelchair Mobility    Modified Rankin (Stroke Patients Only)       Balance Overall balance assessment: Needs assistance Sitting-balance support: Feet supported Sitting balance-Leahy Scale: Good     Standing balance support: Bilateral upper extremity supported Standing balance-Leahy Scale: Poor                      Cognition Arousal/Alertness: Awake/alert Behavior During Therapy: WFL for tasks assessed/performed;Impulsive Overall Cognitive Status: History of cognitive impairments - at baseline       Memory: Decreased  short-term memory              Exercises      General Comments        Pertinent Vitals/Pain Pain Assessment: No/denies pain    Home Living                      Prior Function            PT Goals (current goals can now be found in the care plan section) Acute Rehab PT Goals Patient Stated Goal: "i am going home"    Frequency  Min 2X/week    PT Plan      Co-evaluation             End of Session Equipment Utilized During Treatment: Gait belt   Patient left: in bed;with call bell/phone within reach;with nursing/sitter in room;with bed alarm set     Time:  -     Charges:                       G Codes:      Danielle Dess, PTA 09/25/2015, 1:07 PM

## 2015-09-25 NOTE — Discharge Instructions (Signed)
Heart Failure Clinic appointment on Oct 09, 2015 at 9:00am with Clarisa Kindred, FNP. Please call [phone] to reschedule.

## 2015-09-26 LAB — GLUCOSE, CAPILLARY
GLUCOSE-CAPILLARY: 87 mg/dL (ref 65–99)
Glucose-Capillary: 173 mg/dL — ABNORMAL HIGH (ref 65–99)
Glucose-Capillary: 95 mg/dL (ref 65–99)

## 2015-09-26 MED ORDER — SPIRONOLACTONE 25 MG PO TABS
25.0000 mg | ORAL_TABLET | Freq: Every day | ORAL | Status: DC
  Administered 2015-09-26 – 2015-09-29 (×4): 25 mg via ORAL
  Filled 2015-09-26 (×5): qty 1

## 2015-09-26 MED ORDER — FUROSEMIDE 40 MG PO TABS
40.0000 mg | ORAL_TABLET | Freq: Every day | ORAL | Status: DC
  Administered 2015-09-27 – 2015-09-29 (×3): 40 mg via ORAL
  Filled 2015-09-26 (×3): qty 1

## 2015-09-26 MED ORDER — POTASSIUM CHLORIDE CRYS ER 20 MEQ PO TBCR
20.0000 meq | EXTENDED_RELEASE_TABLET | Freq: Every day | ORAL | Status: DC
  Administered 2015-09-27 – 2015-09-29 (×3): 20 meq via ORAL
  Filled 2015-09-26 (×3): qty 1

## 2015-09-26 NOTE — Progress Notes (Signed)
Patient resting in bed, sitter at bedside, patient lethargic aroussable . Restless at times, a prn pain med administer for headache and back pain, result effective

## 2015-09-26 NOTE — Progress Notes (Signed)
Bedside re[port received, patient resting in the bed at this time, sitter at bedside, patient restless, attempting to get out of bed. bil mittens in place.

## 2015-09-26 NOTE — Progress Notes (Signed)
Physical Therapy Treatment Patient Details Name: JRUE ORMES MRN: [id number] DOB: Mar 07, 1926 Today's Date: 09/26/2015    History of Present Illness Max Bison is a 82 y.o. male with a known history of paroxysmal A. fib is being admitted for CHF exacerbation. He has been having breathing problems last 6 months to 1 year has been gotten worse over last 1-2 days. He has also been having worsening lower shortness swelling noticed by family About 2 days ago, although likely been there for a while.     PT Comments    Pt agreeable to PT, but extremely lethargic and falls fast asleep several times during session, reawakening startled and restless. Pt reports pain in head, chest and abdomen, but is unable to elaborate or quantify. Pt noted to be trembling throughout bilateral upper extremities and occasional poor control with lower extremity movement requiring some assist. Pt does participate in limited active assisted range of motion to active range of motion lower extremity exercises. Pt too lethargic and mildly confused to attempt out of bed activities today. Continue PT as tolerated to progress strength, endurance to progress functional mobility.   Follow Up Recommendations  SNF     Equipment Recommendations  Rolling walker with 5" wheels    Recommendations for Other Services       Precautions / Restrictions Restrictions Weight Bearing Restrictions: No    Mobility  Bed Mobility               General bed mobility comments: Min A for repositioning upward in bed. Not tested up/out of bed due to level of lethargy. Pt falls asleep several times during session.   Transfers                    Ambulation/Gait                 Stairs            Wheelchair Mobility    Modified Rankin (Stroke Patients Only)       Balance                                    Cognition Arousal/Alertness: Lethargic Behavior During Therapy: Restless Overall  Cognitive Status: History of cognitive impairments - at baseline                      Exercises General Exercises - Lower Extremity Ankle Circles/Pumps: AAROM;Both;10 reps;Supine Quad Sets: Strengthening;Both;10 reps;Supine Gluteal Sets: Strengthening;Both;10 reps;Supine Short Arc Quad: AAROM;AROM;Right;10 reps;Supine (AROM on L) Heel Slides: AAROM;10 reps;Supine;AROM;Both Hip ABduction/ADduction: AAROM;Both;10 reps;Supine    General Comments        Pertinent Vitals/Pain Pain Assessment:  (c/o HA, chest and abdominal pain. Does not quantify)    Home Living                      Prior Function            PT Goals (current goals can now be found in the care plan section) Progress towards PT goals: Not progressing toward goals - comment    Frequency  Min 2X/week    PT Plan Current plan remains appropriate    Co-evaluation             End of Session   Activity Tolerance: Patient limited by lethargy (Pt with shakes/trembling in BUEs; occ'l poor control BLEs) Patient left:  in bed;with call bell/phone within reach;with bed alarm set     Time: 1610-9604 PT Time Calculation (min) (ACUTE ONLY): 20 min  Charges:  $Therapeutic Exercise: 8-22 mins                    G Codes:      Kristeen Miss, PTA 09/26/2015, 2:28 PM

## 2015-09-26 NOTE — Progress Notes (Signed)
[facility] [place] at [facility]   PATIENT NAME: [name]    MRN#:  [id number]  DATE OF BIRTH:  25-Apr-1926  SUBJECTIVE:  Hospital Day: 4 days [name] is a 82 y.o. male presenting with Leg Swelling . presenting with Leg Swelling .   Overnight events: Agitation overnight Interval Events: Confused this morning unable to provide meaningful information sitter at bedside  REVIEW OF SYSTEMS:  Unreliable given patient's mental status  DRUG ALLERGIES:  No Known Allergies  VITALS:  Blood pressure 128/67, pulse 65, temperature 98 F (36.7 C), temperature source Oral, resp. rate 20, height  (1.854 m), weight 86.637 kg (191 lb), SpO2 94 %.  PHYSICAL EXAMINATION:  VITAL SIGNS: Filed Vitals:   09/26/15 0418 09/26/15 0801  BP: 97/59 128/67  Pulse: 73 65  Temp:  98 F (36.7 C)  Resp: 22 20   GENERAL:82 y.o.male currently in Moderate acute distress.  HEAD: Normocephalic, atraumatic.  EYES: Pupils equal, round, reactive to light. Extraocular muscles intact. No scleral icterus.  MOUTH: Moist mucosal membrane. Dentition intact. No abscess noted.  EAR, NOSE, THROAT: Clear without exudates. No external lesions.  NECK: Supple. No thyromegaly. No nodules. No JVD.  PULMONARY: Clear to ascultation, without wheeze rails or rhonci. No use of accessory muscles, Good respiratory effort. good air entry bilaterally CHEST: Nontender to palpation.  CARDIOVASCULAR: S1 and S2. Irregular rate and rhythm. No murmurs, rubs, or gallops. No edema. Pedal pulses 2+ bilaterally.  GASTROINTESTINAL: Soft, nontender, nondistended. No masses. Positive bowel sounds. No hepatosplenomegaly.  MUSCULOSKELETAL: No swelling, clubbing, or edema. Range of motion full in all extremities.  NEUROLOGIC: Cranial nerves II through XII are intact. No gross focal neurological deficits. Sensation intact. Reflexes intact.  SKIN: No ulceration, lesions, rashes, or cyanosis. Skin warm and dry. Turgor intact.  PSYCHIATRIC: Mood,  affect flat The patient is arousable Insight, judgment poor.      LABORATORY PANEL:   CBC  Recent Labs Lab 09/23/15 0519  WBC 7.2  HGB 13.9  HCT 41.6  PLT 201   ------------------------------------------------------------------------------------------------------------------  Chemistries   Recent Labs Lab 09/22/15 1428  09/25/15 0511  NA  --   < > 136  K  --   < > 3.4*  CL  --   < > 99*  CO2  --   < > 30  GLUCOSE  --   < > 96  BUN  --   < > 19  CREATININE  --   < > 0.90  CALCIUM  --   < > 8.7*  MG 2.1  --   --   < > = values in this interval not displayed. ------------------------------------------------------------------------------------------------------------------  Cardiac Enzymes  Recent Labs Lab 09/22/15 2345  TROPONINI 0.19*   ------------------------------------------------------------------------------------------------------------------  RADIOLOGY:  No results found.  EKG:   Orders placed or performed during the hospital encounter of 09/22/15  . 12 lead EKG  . 12 lead EKG  . EKG 12-Lead  . EKG 12-Lead  . EKG 12-Lead  . EKG 12-Lead  . EKG 12-Lead  . EKG 12-Lead  . EKG 12-Lead  . EKG 12-Lead    ASSESSMENT AND PLAN:   [name] is a 82 y.o. male presenting with Leg Swelling . presenting with Leg Swelling . Admitted 09/22/2015 : Day #: 4 days 1. Acute on chronic systolic congestive heart failure. EF 25-30% with moderate mitral regurgitation. Lower extremity edema. Lasix switched to 40 mg oral twice a day. Continue metoprolol of blood pressure able to tolerate. 2. Multifocal atrial tachycardia, atrial fibrillation. Patient  switched over to oral amiodarone today. Digoxin was stopped. 3. Elevated troponin likely secondary to demand ischemia with heart failure and arrhythmia. 4. COPD. Continue inhalers 5. Relative hypotension. Blood pressure still on the lower side Agitation  Seroquel 25 mg at night   All the records are reviewed and case discussed with Care  Management/Social Workerr. Management plans discussed with the patient, family and they are in agreement.  CODE STATUS: full TOTAL TIME TAKING CARE OF THIS PATIENT: 28 minutes.   POSSIBLE D/C IN 1-2DAYS, DEPENDING ON CLINICAL CONDITION.   [name],  Mardi Mainland.D on 09/26/2015 at 11:37 AM  Between 7am to 6pm - Pager - [phone]  After 6pm: House Pager: - [phone]  [facility]  Office  [phone]  CC: Primary care physician; Tillman Abide, MD

## 2015-09-26 NOTE — Progress Notes (Signed)
Patient: [name] / Admit Date: 09/22/2015 / Date of Encounter: 09/26/2015, 12:03 PM   Subjective: Patient complains of dyspnea improved with breathing treatment; no chest pain   Review of Systems: ROS confused; see subjective; otherwise negative  Objective: Telemetry: Sinus to sinus bradycardia; occasional junctional escape beats; brief PAT Physical Exam: Blood pressure 95/67, pulse 73, temperature 98 F (36.7 C), temperature source Oral, resp. rate 20, height  (1.854 m), weight 191 lb (86.637 kg), SpO2 93 %. Body mass index is 25.2 kg/(m^2). General: Well developed, Chronically ill appearing in no acute distress. Head: Normal Neck: Supple Lungs: Diminished BS throughout Heart: RRR Abdomen: Soft, non-tender, non-distended Extremities: No edema Neuro: Mildly confused but improved compared to last PM; oriented to person and time; thinks he is in [place]   Intake/Output Summary (Last 24 hours) at 09/26/15 1203 Last data filed at 09/26/15 0944  Gross per 24 hour  Intake    240 ml  Output   2425 ml  Net  -2185 ml    Inpatient Medications:  . amiodarone  200 mg Oral BID  . aspirin EC  81 mg Oral Daily  . docusate sodium  100 mg Oral BID  . enoxaparin (LOVENOX) injection  40 mg Subcutaneous Q24H  . fluticasone furoate-vilanterol  1 puff Inhalation Daily  . furosemide  40 mg Oral BID  . metoprolol tartrate  12.5 mg Oral BID  . potassium chloride  20 mEq Oral BID  . QUEtiapine  25 mg Oral QHS  . tiotropium  18 mcg Inhalation Daily   Infusions:    Labs:  Recent Labs  09/24/15 0523 09/25/15 0511  NA 137 136  K 3.6 3.4*  CL 104 99*  CO2 24 30  GLUCOSE 109* 96  BUN 20 19  CREATININE 0.94 0.90  CALCIUM 8.7* 8.7*    Weights: Filed Weights   09/24/15 1117 09/25/15 0504 09/26/15 0418  Weight: 196 lb 14.4 oz (89.313 kg) 191 lb 3.2 oz (86.728 kg) 191 lb (86.637 kg)     Radiology/Studies:  Dg Chest Port 1 View  09/22/2015  CLINICAL DATA:  Shortness of  breath with lower extremity swelling. EXAM: PORTABLE CHEST 1 VIEW COMPARISON:  02/24/2015 FINDINGS: 1122 hours. Lung volumes are low. Interstitial markings are diffusely coarsened with chronic features. Vascular congestion noted without overt airspace pulmonary edema. There is bibasilar atelectasis and a component of dependent interstitial edema cannot be excluded. No substantial pleural effusion. The cardio pericardial silhouette is enlarged. Bones are diffusely demineralized. Telemetry leads overlie the chest. IMPRESSION: Low volume film with cardiomegaly with vascular congestion. Symmetric interstitial and patchy opacity at the lung bases may be related dependent edema and/ or atelectasis. Electronically Signed   By: Kennith Center M.D.   On: 09/22/2015 11:35     Assessment and Plan   1. Acute on chronic combined CHF/Cardiomyopathy: -Echo with newly found EF 25-30%, severe HK of the anterior, anteroseptal and apical myocardium, study not technically sufficient to allow for LV diastolic function, moderate MR, mild biatrial enlargement, RV mildly dilated with mildly reduced systolic function, PASP 51 mm Hg  - Felt to be old anterior MI - Volume overload improved; now euvolemic on exam; change lasix to 40 mg daily; decrease Kdur to 20 meq daily; add spironolactone 25 mg daiy; BMET in AM.  - DC metoprolol as occasional junctional escape beats on telemetry. - Not able to start an ACE inhibitor or ARB due to hypotension. Can reassess as outpt.  -  Patient does not appear to be a candidate for ischemia evaluation given age and comorbidities.  2. Paroxysmal atrial tachycardia: - Rhythm appears to be improving; would continue amiodarone load; change to 200 mg daily in 2 weeks.  3. Elevated troponin/CAD with history of prior anterior wall MI seen on echo as above: -Mildly elevated at 0.19-->0.18-->0.19 -No symptoms of angina -Not consistent with acute coronary syndrome. - Given his age and comorbidities,  not a good candidate for ischemic evaluation. This can be readdressed outpatient.  4. COPD: -Stable     Signed, Olga Millers, MD  09/26/2015, 12:03 PM

## 2015-09-26 NOTE — Progress Notes (Signed)
Notified by sitter of skin tear to left upper arm.  Pt slept after hs meds but with periods of intermittent agitation and combativeness.  Left arm cleansed and dressed per protocol.

## 2015-09-27 LAB — CBC
HCT: 45.4 % (ref 40.0–52.0)
Hemoglobin: 15.2 g/dL (ref 13.0–18.0)
MCH: 31.5 pg (ref 26.0–34.0)
MCHC: 33.5 g/dL (ref 32.0–36.0)
MCV: 94.3 fL (ref 80.0–100.0)
PLATELETS: 157 10*3/uL (ref 150–440)
RBC: 4.82 MIL/uL (ref 4.40–5.90)
RDW: 16.2 % — AB (ref 11.5–14.5)
WBC: 8.9 10*3/uL (ref 3.8–10.6)

## 2015-09-27 LAB — BASIC METABOLIC PANEL
Anion gap: 8 (ref 5–15)
BUN: 22 mg/dL — AB (ref 6–20)
CALCIUM: 8.8 mg/dL — AB (ref 8.9–10.3)
CO2: 28 mmol/L (ref 22–32)
Chloride: 102 mmol/L (ref 101–111)
Creatinine, Ser: 0.93 mg/dL (ref 0.61–1.24)
GFR calc Af Amer: >60 mL/min (ref >60)
GLUCOSE: 116 mg/dL — AB (ref 65–99)
Potassium: 4.2 mmol/L (ref 3.5–5.1)
SODIUM: 138 mmol/L (ref 135–145)

## 2015-09-27 LAB — GLUCOSE, CAPILLARY: GLUCOSE-CAPILLARY: 112 mg/dL — AB (ref 65–99)

## 2015-09-27 NOTE — Care Management Important Message (Signed)
Important Message  Patient Details  Name: [name] MRN: [id number] Date of Birth: 03-10-1926   Medicare Important Message Given:  Yes    Aliany Fiorenza A, RN 09/27/2015, 2:14 PM

## 2015-09-27 NOTE — Progress Notes (Signed)
Patient in bed, family updated earlier per Dr Dimas Aguas about pt condition, no c/o pain at this time

## 2015-09-27 NOTE — Progress Notes (Signed)
    Subjective:  Denies CP; mildly dyspneic.    Objective:  Filed Vitals:   09/26/15 1142 09/26/15 1358 09/26/15 1954 09/27/15 0836  BP: 95/67  95/54 124/81  Pulse: 73 64 64 61  Temp:   98.3 F (36.8 C) 97.8 F (36.6 C)  TempSrc:   Oral Oral  Resp: 20  22 18   Height:      Weight:   188 lb 14.4 oz (85.684 kg)   SpO2: 93% 93% 90% 91%    Intake/Output from previous day:  Intake/Output Summary (Last 24 hours) at 09/27/15 1235 Last data filed at 09/27/15 1035  Gross per 24 hour  Intake    720 ml  Output    850 ml  Net   -130 ml    Physical Exam: Physical exam: Well-developed chronically ill appearing in no acute distress.  Skin is warm and dry.  HEENT is normal.  Neck is supple.  Chest with diminished BS throughout Cardiovascular exam is regular rate and rhythm.  Abdominal exam nontender or distended. No masses palpated. Extremities show no edema. neuro A and O x 2 (not place)    Lab Results: Basic Metabolic Panel:  Recent Labs  36/46/80 0511 09/27/15 0606  NA 136 138  K 3.4* 4.2  CL 99* 102  CO2 30 28  GLUCOSE 96 116*  BUN 19 22*  CREATININE 0.90 0.93  CALCIUM 8.7* 8.8*   CBC:  Recent Labs  09/27/15 0606  WBC 8.9  HGB 15.2  HCT 45.4  MCV 94.3  PLT 157     Assessment/Plan:  1. Acute on chronic combined CHF/Cardiomyopathy: -Echo with newly found EF 25-30%, severe HK of the anterior, anteroseptal and apical myocardium, study not technically sufficient to allow for LV diastolic function, moderate MR, mild biatrial enlargement, RV mildly dilated with mildly reduced systolic function, PASP 51 mm Hg  - Felt to be old anterior MI - Volume overload improved; continue lasix 40 mg daily and spironolactone 25 mg daily. - Beta blocker DCed as having occasional junctional beats on telemetry. - Not able to start an ACE inhibitor or ARB due to hypotension. Can reassess as outpt.  - Patient does not appear to be a candidate for ischemia evaluation given  age and comorbidities.  2. Paroxysmal atrial tachycardia: - Rhythm appears to be improving but still with intermittent PAT; would continue amiodarone load; change to 200 mg daily in 2 weeks.  3. Elevated troponin/CAD with history of prior anterior wall MI seen on echo as above: -Mildly elevated at 0.19-->0.18-->0.19 -No symptoms of angina -Not consistent with acute coronary syndrome. - Given his age and comorbidities, not a good candidate for ischemic evaluation. This can be readdressed outpatient.  4. COPD: -Stable  Olga Millers 09/27/2015, 12:35 PM

## 2015-09-27 NOTE — Progress Notes (Signed)
Patient refused to wear telemetry monitor and refused vitals signs. Patient becomes agitated when touched. Will continue to monitor.

## 2015-09-27 NOTE — Progress Notes (Signed)
Patient very short of breath with exertion, patient agreed to have oxygen on at 2L [place], breathing improved at this time, HR fluctuate from 60S to 129 at times, patient asymptomatic.

## 2015-09-27 NOTE — Progress Notes (Signed)
Patient  In bed at this time, awake at this times and go back to sleep, very lethargic but aroussable , sitter at bedside tele on, HR 69 Afib and irregular , hr up 122 at times with movement.

## 2015-09-27 NOTE — Progress Notes (Signed)
[facility] - [place] at [facility]   PATIENT NAME: [name]    MRN#:  [id number]  DATE OF BIRTH:  01/26/1926  SUBJECTIVE:  Hospital Day: 5 days [name] is a 82 y.o. male presenting with Leg Swelling .   Overnight events: Agitation overnight Interval Events: Confused this morning unable to provide meaningful information sitter at bedside  REVIEW OF SYSTEMS:  Unreliable given patient's mental status  DRUG ALLERGIES:  No Known Allergies  VITALS:  Blood pressure 124/81, pulse 61, temperature 97.8 F (36.6 C), temperature source Oral, resp. rate 18, height  (1.854 m), weight 85.684 kg (188 lb 14.4 oz), SpO2 91 %.  PHYSICAL EXAMINATION:  VITAL SIGNS: Filed Vitals:   09/26/15 1954 09/27/15 0836  BP: 95/54 124/81  Pulse: 64 61  Temp: 98.3 F (36.8 C) 97.8 F (36.6 C)  Resp: 22 18   GENERAL:82 y.o.male currently in Moderate acute distress. Given mental status HEAD: Normocephalic, atraumatic.  EYES: Pupils equal, round, reactive to light. Extraocular muscles intact. No scleral icterus.  MOUTH: Moist mucosal membrane. Dentition intact. No abscess noted.  EAR, NOSE, THROAT: Clear without exudates. No external lesions.  NECK: Supple. No thyromegaly. No nodules. No JVD.  PULMONARY: Clear to ascultation, without wheeze rails or rhonci. No use of accessory muscles, Good respiratory effort. good air entry bilaterally CHEST: Nontender to palpation.  CARDIOVASCULAR: S1 and S2. Irregular rate and rhythm. No murmurs, rubs, or gallops. No edema. Pedal pulses 2+ bilaterally.  GASTROINTESTINAL: Soft, nontender, nondistended. No masses. Positive bowel sounds. No hepatosplenomegaly.  MUSCULOSKELETAL: No swelling, clubbing, or edema. Range of motion full in all extremities.  NEUROLOGIC: Cranial nerves II through XII are intact. No gross focal neurological deficits. Sensation intact. Reflexes intact.  SKIN: No ulceration, lesions, rashes, or cyanosis. Skin warm  and dry. Turgor intact.  PSYCHIATRIC: Mood, affect flat The patient is arousable Insight, judgment poor.      LABORATORY PANEL:   CBC  Recent Labs Lab 09/27/15 0606  WBC 8.9  HGB 15.2  HCT 45.4  PLT 157   ------------------------------------------------------------------------------------------------------------------  Chemistries   Recent Labs Lab 09/22/15 1428  09/27/15 0606  NA  --   < > 138  K  --   < > 4.2  CL  --   < > 102  CO2  --   < > 28  GLUCOSE  --   < > 116*  BUN  --   < > 22*  CREATININE  --   < > 0.93  CALCIUM  --   < > 8.8*  MG 2.1  --   --   < > = values in this interval not displayed. ------------------------------------------------------------------------------------------------------------------  Cardiac Enzymes  Recent Labs Lab 09/22/15 2345  TROPONINI 0.19*   ------------------------------------------------------------------------------------------------------------------  RADIOLOGY:  No results found.  EKG:   Orders placed or performed during the hospital encounter of 09/22/15  . 12 lead EKG  . 12 lead EKG  . EKG 12-Lead  . EKG 12-Lead  . EKG 12-Lead  . EKG 12-Lead  . EKG 12-Lead  . EKG 12-Lead  . EKG 12-Lead  . EKG 12-Lead    ASSESSMENT AND PLAN:   [name] is a 82 y.o. male presenting with Leg Swelling . Admitted 09/22/2015 : Day #: 5 days 1. Acute on chronic systolic congestive heart failure. EF 25-30% with moderate mitral regurgitation. Lower extremity edema. Lasix switched to 40 mg oral twice a day.   2. Multifocal atrial tachycardia, atrial fibrillation.amiodarone  3. Elevated troponin likely secondary to demand ischemia with heart failure and arrhythmia. 4. COPD. Continue inhalers 5. Metabolic encephalopathy/sundowning with baseline dementia patient on Seroquel started a few days ago agitation is improved  Attempt to call wife today to give updated no response, with physical therapy recommending skilled placement  family originally hesitant presented this though I think this clearly the best option.   All the records are reviewed and case discussed with Care Management/Social Workerr. Management plans discussed with the patient, family and they are in agreement.  CODE STATUS: full TOTAL TIME TAKING CARE OF THIS PATIENT: 28 minutes.   POSSIBLE D/C IN 1-2DAYS, DEPENDING ON CLINICAL CONDITION.   Hower,  [name].D on 09/27/2015 at 11:24 AM  Between 7am to 6pm - Pager - [phone]  After 6pm: House Pager: - [phone]  [facility]  Office  [phone]  CC: Primary care physician; Kenneth Abide, MD

## 2015-09-28 ENCOUNTER — Other Ambulatory Visit: Payer: Self-pay | Admitting: *Deleted

## 2015-09-28 LAB — GLUCOSE, CAPILLARY: Glucose-Capillary: 121 mg/dL — ABNORMAL HIGH (ref 65–99)

## 2015-09-28 NOTE — Clinical Social Work Note (Signed)
Informed by RN CM documentation that patient's daughter and wife are declining rehab at this time and wish to take patient home tomorrow when discharged. RN CM making arrangements. York Spaniel MSW,LCSW [phone]

## 2015-09-28 NOTE — Care Management Note (Signed)
Case Management Note  Patient Details  Name: [name] MRN: [id number] Date of Birth: [birth year]  Subjective/Objective:    Discussed discharge planning with Dr Clint Guy and daughter Daire Spradling ph: [phone]. Dr Clint Guy is considering possible discharge to home tomorrow. [name], his wife Darl Pikes, and daughter [name] all want [name] to be discharged home despite [facility]-PT recommendation for SNF. This Clinical research associate discussed concerns about [name] current immobility issues with daughter [name] but daughter insists that her Dad will improve faster at home than in a rehab facility. Daughter [name] reports that she is talking with [facility] to arrange 24/7 caregivers. This Clinical research associate advised [name] that CM can arrange skilled home health SN and PT, but that this would only be temporary and for a few times per week. A referral for home health was called to [place] [facility]. Daughter [name] will contact CM as soon as she has 27/7 caretaker arranged. Dr Clint Guy was updated about daughter's plan for discharge home. Dr Clint Guy spoke at length with daughter [name] yesterday. Case management will continue to follow for discharge planning.              Action/Plan:   Expected Discharge Date:                  Expected Discharge Plan:     In-House Referral:     Discharge planning Services     Post Acute Care Choice:    Choice offered to:     DME Arranged:    DME Agency:     HH Arranged:    HH Agency:     Status of Service:     Medicare Important Message Given:  Yes Date Medicare IM Given:    Medicare IM give by:    Date Additional Medicare IM Given:    Additional Medicare Important Message give by:     If discussed at Long Length of Stay Meetings, dates discussed:    Additional Comments:  Egor Fullilove A, RN 09/28/2015, 9:54 AM

## 2015-09-28 NOTE — Progress Notes (Signed)
PT Cancellation Note  Patient Details Name: KALIX SCHEY MRN: [id number] DOB: 10-30-1925   Cancelled Treatment:    Reason Eval/Treat Not Completed: Patient declined, no reason specified. Chart reviewed and attempted to work with patient. Pt refuses participation with physical therapy despite repeated encouragement to participate. Refuses mobility and bed exercises. Pt states that he doesn't feel well today. Will attempt on later date/time as pt is willing/appropriate.  Sharalyn Ink Kingsley Farace PT, DPT   Dynisha Due 09/28/2015, 12:00 PM

## 2015-09-28 NOTE — Progress Notes (Signed)
Patient: [name] / Admit Date: 09/22/2015 / Date of Encounter: 09/28/2015, 8:57 AM   Subjective: The patient reports improvement in symptoms overall. He is asking about going home. Overnight.   Review of Systems: ROS mild confusion and otherwise negative.  Objective: Telemetry: Sinus to sinus bradycardia; occasional junctional escape beats; brief PAT Physical Exam: Blood pressure 124/73, pulse 40, temperature 98.3 F (36.8 C), temperature source Oral, resp. rate 19, height 6\' 1"  (1.854 m), weight 188 lb 14.4 oz (85.684 kg), SpO2 94 %. Body mass index is 24.93 kg/(m^2). General: Well developed, Chronically ill appearing in no acute distress. Head: Normal Neck: Supple Lungs: Diminished BS throughout Heart: RRR Abdomen: Soft, non-tender, non-distended Extremities: No edema Neuro: Mildly confused . He initially thought that his house was in [place] but quickly corrected himself that he moved from there 5 years ago.   Intake/Output Summary (Last 24 hours) at 09/28/15 0857 Last data filed at 09/28/15 0339  Gross per 24 hour  Intake    480 ml  Output   1300 ml  Net   -820 ml    Inpatient Medications:  . amiodarone  200 mg Oral BID  . aspirin EC  81 mg Oral Daily  . docusate sodium  100 mg Oral BID  . enoxaparin (LOVENOX) injection  40 mg Subcutaneous Q24H  . fluticasone furoate-vilanterol  1 puff Inhalation Daily  . furosemide  40 mg Oral Daily  . potassium chloride  20 mEq Oral Daily  . QUEtiapine  25 mg Oral QHS  . spironolactone  25 mg Oral Daily  . tiotropium  18 mcg Inhalation Daily   Infusions:    Labs:  Recent Labs  09/27/15 0606  NA 138  K 4.2  CL 102  CO2 28  GLUCOSE 116*  BUN 22*  CREATININE 0.93  CALCIUM 8.8*    Weights: Filed Weights   09/25/15 0504 09/26/15 0418 09/26/15 1954  Weight: 191 lb 3.2 oz (86.728 kg) 191 lb (86.637 kg) 188 lb 14.4 oz (85.684 kg)     Radiology/Studies:  Dg Chest Port 1 View  09/22/2015  CLINICAL DATA:   Shortness of breath with lower extremity swelling. EXAM: PORTABLE CHEST 1 VIEW COMPARISON:  02/24/2015 FINDINGS: 1122 hours. Lung volumes are low. Interstitial markings are diffusely coarsened with chronic features. Vascular congestion noted without overt airspace pulmonary edema. There is bibasilar atelectasis and a component of dependent interstitial edema cannot be excluded. No substantial pleural effusion. The cardio pericardial silhouette is enlarged. Bones are diffusely demineralized. Telemetry leads overlie the chest. IMPRESSION: Low volume film with cardiomegaly with vascular congestion. Symmetric interstitial and patchy opacity at the lung bases may be related dependent edema and/ or atelectasis. Electronically Signed   By: Kennith Center M.D.   On: 09/22/2015 11:35     Assessment and Plan   1. Acute on chronic combined CHF/Cardiomyopathy: -Echo with newly found EF 25-30%, severe HK of the anterior, anteroseptal and apical myocardium, study not technically sufficient to allow for LV diastolic function, moderate MR, mild biatrial enlargement, RV mildly dilated with mildly reduced systolic function, PASP 51 mm Hg  - Felt to be old anterior MI -He appears euvolemic now. Continue  lasix to 40 mg daily and spironolactone 25 mg daily. -Metoprolol was discontinued due to episodes of bradycardia and junctional rhythm. - Not able to start an ACE inhibitor or ARB due to hypotension. Can reassess as outpt.  - Patient does not appear to be a candidate for ischemia evaluation  given age and comorbidities.  2. Paroxysmal atrial tachycardia: - Rhythm appears to be improving; would continue amiodarone load; change to 200 mg daily in 2 weeks.  3. Elevated troponin/CAD with history of prior anterior wall MI seen on echo as above: -Mildly elevated at 0.19-->0.18-->0.19 -No symptoms of angina -Not consistent with acute coronary syndrome. - Given his age and comorbidities, not a good candidate for ischemic  evaluation. This can be readdressed outpatient.  4. COPD: -Stable   The patient appears to be very deconditioned and might need short-term rehabilitation.   Signed, Lorine Bears, MD  09/28/2015, 8:57 AM

## 2015-09-28 NOTE — Progress Notes (Signed)
[facility] - [place] at [facility]   PATIENT NAME: [name]    MRN#:  [id number]  DATE OF BIRTH:  [birth year]  SUBJECTIVE:  Hospital Day: 6 days [name] is a 82 y.o. male presenting with Leg Swelling .   Overnight events: No overnight events Interval Events: More alert this morning but remains confused and unable to provide meaningful information sitter at bedside  REVIEW OF SYSTEMS:  Unreliable given patient's mental status  DRUG ALLERGIES:  No Known Allergies  VITALS:  Blood pressure 95/57, pulse 52, temperature 97.8 F (36.6 C), temperature source Oral, resp. rate 19, height  (1.854 m), weight 86.183 kg (190 lb), SpO2 93 %.  PHYSICAL EXAMINATION:  VITAL SIGNS: Filed Vitals:   09/28/15 0800 09/28/15 1121  BP:  95/57  Pulse: 115 52  Temp:  97.8 F (36.6 C)  Resp:  19   GENERAL:82 y.o.male currently in Moderate acute distress. Given mental status HEAD: Normocephalic, atraumatic.  EYES: Pupils equal, round, reactive to light. Extraocular muscles intact. No scleral icterus.  MOUTH: Moist mucosal membrane. Dentition intact. No abscess noted.  EAR, NOSE, THROAT: Clear without exudates. No external lesions.  NECK: Supple. No thyromegaly. No nodules. No JVD.  PULMONARY: Clear to ascultation, without wheeze rails or rhonci. No use of accessory muscles, Good respiratory effort. good air entry bilaterally CHEST: Nontender to palpation.  CARDIOVASCULAR: S1 and S2. Irregular rate and rhythm. No murmurs, rubs, or gallops. No edema. Pedal pulses 2+ bilaterally.  GASTROINTESTINAL: Soft, nontender, nondistended. No masses. Positive bowel sounds. No hepatosplenomegaly.  MUSCULOSKELETAL: No swelling, clubbing, or edema. Range of motion full in all extremities.  NEUROLOGIC: Cranial nerves II through XII are intact. No gross focal neurological deficits. Sensation intact. Reflexes intact.  SKIN: No ulceration, lesions, rashes, or cyanosis. Skin warm and  dry. Turgor intact.  PSYCHIATRIC: Mood, affect flat The patient is arousable Insight, judgment poor.      LABORATORY PANEL:   CBC  Recent Labs Lab 09/27/15 0606  WBC 8.9  HGB 15.2  HCT 45.4  PLT 157   ------------------------------------------------------------------------------------------------------------------  Chemistries   Recent Labs Lab 09/22/15 1428  09/27/15 0606  NA  --   < > 138  K  --   < > 4.2  CL  --   < > 102  CO2  --   < > 28  GLUCOSE  --   < > 116*  BUN  --   < > 22*  CREATININE  --   < > 0.93  CALCIUM  --   < > 8.8*  MG 2.1  --   --   < > = values in this interval not displayed. ------------------------------------------------------------------------------------------------------------------  Cardiac Enzymes  Recent Labs Lab 09/22/15 2345  TROPONINI 0.19*   ------------------------------------------------------------------------------------------------------------------  RADIOLOGY:  No results found.  EKG:   Orders placed or performed during the hospital encounter of 09/22/15  . 12 lead EKG  . 12 lead EKG  . EKG 12-Lead  . EKG 12-Lead  . EKG 12-Lead  . EKG 12-Lead  . EKG 12-Lead  . EKG 12-Lead  . EKG 12-Lead  . EKG 12-Lead    ASSESSMENT AND PLAN:   [name] is a 82 y.o. male presenting with Leg Swelling . Admitted 09/22/2015 : Day #: 6 days 1. Acute on chronic systolic congestive heart failure. EF 25-30% with moderate mitral regurgitation. Lower extremity edema. Lasix switched to 40 mg oral twice a day.   2. Multifocal atrial tachycardia, atrial  fibrillation.amiodarone   3. Elevated troponin likely secondary to demand ischemia with heart failure and arrhythmia. 4. COPD. Continue inhalers 5. Metabolic encephalopathy/sundowning with baseline dementia patient on Seroquel started a few days ago agitation is improved  Discussed with family placement versus home with a request home with health care   All the records are  reviewed and case discussed with Care Management/Social Workerr. Management plans discussed with the patient, family and they are in agreement.  CODE STATUS: full TOTAL TIME TAKING CARE OF THIS PATIENT: 28 minutes.   POSSIBLE D/C IN 1-2DAYS, DEPENDING ON CLINICAL CONDITION.   Hower,  Mardi Mainland.D on 09/28/2015 at 12:23 PM  Between 7am to 6pm - Pager - [phone]  After 6pm: House Pager: - [phone]  [facility]  Office  [phone]  CC: Primary care physician; Tillman Abide, MD

## 2015-09-28 NOTE — Consult Note (Signed)
   [facility] CM Inpatient Consult   09/28/2015  [name] 09-28-1925 [id number]  Patient eligible for [place] with diagnosis of COPD for post hospital discharge follow up. Patient was evaluated for community based chronic disease management services with [facility] care Management Program as a benefit of patient's Traditional Medicare. Met with the patient and his spouse [name] last week and again today at the bedside to explain [place] Management services. Patient endorses his primary care provider to be Dr. Viviana Simpler. Patient's spouse states he was previously receiving [place] services but can not remember company's name. Consent form signed.  Patient states the best number to reach him is [phone] and gave written permission to speak with his spouse [name] and daughter [name] [phone]. Patient will receive post hospital discharge calls and be evaluated for monthly home visits. The [facility] Care Management services does not interfere with or replace any services arranged by the inpatient care management team.  RNCM left contact information and [facility] literature at the bedside. Made inpatient RNCM aware that [facility] will be following for care management. Per inpatient case manager SNF placement was reccommended, but patient and family refused. For additional questions please contact:   Louna Rothgeb RN, [facility] Liaison  [phone]) Business Mobile [phone]) Toll free office

## 2015-09-29 ENCOUNTER — Ambulatory Visit: Payer: Medicare Other | Admitting: Internal Medicine

## 2015-09-29 ENCOUNTER — Telehealth: Payer: Self-pay

## 2015-09-29 DIAGNOSIS — F0391 Unspecified dementia with behavioral disturbance: Secondary | ICD-10-CM

## 2015-09-29 LAB — GLUCOSE, CAPILLARY: Glucose-Capillary: 114 mg/dL — ABNORMAL HIGH (ref 65–99)

## 2015-09-29 MED ORDER — FUROSEMIDE 40 MG PO TABS: 40.0000 mg | ORAL_TABLET | Freq: Every day | ORAL | Status: DC

## 2015-09-29 MED ORDER — AMIODARONE HCL 200 MG PO TABS: 200.0000 mg | ORAL_TABLET | Freq: Two times a day (BID) | ORAL | Status: DC

## 2015-09-29 MED ORDER — SPIRONOLACTONE 25 MG PO TABS: 25.0000 mg | ORAL_TABLET | Freq: Every day | ORAL | Status: DC

## 2015-09-29 MED ORDER — ASPIRIN 81 MG PO TBEC: 81.0000 mg | DELAYED_RELEASE_TABLET | Freq: Every day | ORAL | Status: DC

## 2015-09-29 MED ORDER — POTASSIUM CHLORIDE CRYS ER 20 MEQ PO TBCR: 20.0000 meq | EXTENDED_RELEASE_TABLET | Freq: Every day | ORAL | Status: DC

## 2015-09-29 NOTE — Progress Notes (Signed)
Patient: [name] / Admit Date: 09/22/2015 / Date of Encounter: 09/29/2015, 10:13 AM   Subjective: Confused, did not sleep well last night, sitter at the bedside reports that he was awake every 15 minutes. Unable to talk for very long this morning without falling back asleep. Some confusion as to what the doctors are saying to him  Review of Systems: Review of Systems  Unable to perform ROS   Objective: Telemetry: Normal sinus rhythm with runs of MAT, tachycardia Physical Exam: Blood pressure 134/86, pulse 123, temperature 98.2 F (36.8 C), temperature source Oral, resp. rate 18, height 6\' 1"  (1.854 m), weight 186 lb (84.369 kg), SpO2 92 %. Body mass index is 24.55 kg/(m^2). General: Well developed, well nourished, in no acute distress. On nasal cannula oxygen  Head: Normocephalic, atraumatic, sclera non-icteric, no xanthomas, nares are without discharge. Neck: Negative for carotid bruits. JVP not elevated. Lungs: Diffuse mild rales throughout Heart: RRR S1 S2 without murmurs, rubs, or gallops.  Abdomen: Soft, non-tender, non-distended with normoactive bowel sounds. No rebound/guarding. Extremities: No clubbing or cyanosis. No edema. Distal pedal pulses are 2+ and equal bilaterally. Neuro: Alert but falls back asleep, arousable, moves all extremities Psych: Confused, lethargic   Intake/Output Summary (Last 24 hours) at 09/29/15 1013 Last data filed at 09/29/15 0725  Gross per 24 hour  Intake    480 ml  Output    300 ml  Net    180 ml    Inpatient Medications:  . amiodarone  200 mg Oral BID  . aspirin EC  81 mg Oral Daily  . docusate sodium  100 mg Oral BID  . enoxaparin (LOVENOX) injection  40 mg Subcutaneous Q24H  . fluticasone furoate-vilanterol  1 puff Inhalation Daily  . furosemide  40 mg Oral Daily  . potassium chloride  20 mEq Oral Daily  . QUEtiapine  25 mg Oral QHS  . spironolactone  25 mg Oral Daily  . tiotropium  18 mcg Inhalation Daily   Infusions:      Labs:  Recent Labs  09/27/15 0606  NA 138  K 4.2  CL 102  CO2 28  GLUCOSE 116*  BUN 22*  CREATININE 0.93  CALCIUM 8.8*   No results for input(s): AST, ALT, ALKPHOS, BILITOT, PROT, ALBUMIN in the last 72 hours.  Recent Labs  09/27/15 0606  WBC 8.9  HGB 15.2  HCT 45.4  MCV 94.3  PLT 157   No results for input(s): CKTOTAL, CKMB, TROPONINI in the last 72 hours. Invalid input(s): POCBNP No results for input(s): HGBA1C in the last 72 hours.   Weights: Filed Weights   09/26/15 1954 09/28/15 0900 09/29/15 0533  Weight: 188 lb 14.4 oz (85.684 kg) 190 lb (86.183 kg) 186 lb (84.369 kg)     Radiology/Studies:  Dg Chest Port 1 View  09/22/2015  CLINICAL DATA:  Shortness of breath with lower extremity swelling. EXAM: PORTABLE CHEST 1 VIEW COMPARISON:  02/24/2015 FINDINGS: 1122 hours. Lung volumes are low. Interstitial markings are diffusely coarsened with chronic features. Vascular congestion noted without overt airspace pulmonary edema. There is bibasilar atelectasis and a component of dependent interstitial edema cannot be excluded. No substantial pleural effusion. The cardio pericardial silhouette is enlarged. Bones are diffusely demineralized. Telemetry leads overlie the chest. IMPRESSION: Low volume film with cardiomegaly with vascular congestion. Symmetric interstitial and patchy opacity at the lung bases may be related dependent edema and/ or atelectasis. Electronically Signed   By: Jamison Oka.D.  On: 09/22/2015 11:35     Assessment and Plan  82 y.o. male   1. Acute on chronic combined CHF/Cardiomyopathy:  EF 25-30%,  Felt to be old anterior MI severe HK of the anterior, anteroseptal and apical myocardium,  moderate MR, mild biatrial enlargement, RV mildly dilated with mildly reduced systolic function, PASP 51 mm Hg   ---Continue lasix to 40 mg daily and spironolactone 25 mg daily. -Metoprolol was discontinued due to episodes of bradycardia and junctional  rhythm. - Not able to start an ACE inhibitor or ARB due to hypotension.  - Patient does not appear to be a candidate for ischemia evaluation given age and comorbidities, dementia/sundowning  2. Paroxysmal atrial tachycardia/MAT: Continue 200 mg daily in 2 weeks. Ideally should follow up with EP at [facility] as he was doing previously (but was medication noncompliant)  3. Elevated troponin/CAD history of prior anterior wall MI  -Mildly elevated at 0.19-->0.18-->0.19 -No symptoms of angina, Not consistent with acute coronary syndrome. - Given his age and comorbidities/dementia, sundowning, confusion, not a good candidate for ischemic evaluation.   4. COPD: Playing a significant role in his breathing  Ideally needs skilled nursing facility placement, family has refused Will need to express to the daughter that he needs to be compliant with his medications, has had significant medication noncompliance in the past year   Signed, Dossie Arbour, MD, Ph.D. [facility] 09/29/2015, 10:13 AM

## 2015-09-29 NOTE — Progress Notes (Signed)
SATURATION QUALIFICATIONS: (This note is used to comply with regulatory documentation for home oxygen)  Patient Saturations on Room Air at Rest = 90%  Patient Saturations on Room Air while Ambulating = 82%  Patient Saturations on 3 Liters of oxygen while Ambulating = 90%  Please briefly explain why patient needs home oxygen:

## 2015-09-29 NOTE — Telephone Encounter (Signed)
Attempted to contact pt regarding discharge from [facility] on 09/29/15. Left message asking pt to call back regarding discharge instructions and/or medications. Advised pt of appt w/ Ward Givens, NP on 10/08/15 at 1:30 w/ [facility]. Asked pt to call back if unable to keep this appt.

## 2015-09-29 NOTE — Discharge Summary (Addendum)
[facility] - [place] [facility]   PATIENT NAME: [name]    MR#:  [id number]  DATE OF BIRTH:  April 24, 1926  DATE OF ADMISSION:  09/22/2015 ADMITTING PHYSICIAN: Delfino Lovett, MD  DATE OF DISCHARGE: 09/29/2015  PRIMARY CARE PHYSICIAN: Tillman Abide, MD    ADMISSION DIAGNOSIS:  Paroxysmal atrial fibrillation (HCC) [I48.0] Atrial fibrillation with rapid ventricular response (HCC) [I48.91] Acute congestive heart failure, unspecified congestive heart failure type (HCC) [I50.9]  DISCHARGE DIAGNOSIS:  1. Acute on chronic systolic congestive heart failure. EF 25-30% with moderate mitral regurgitation.  2. Multifocal atrial tachycardia, atrial fibrillation  3. Elevated troponin likely secondary to demand ischemia   SECONDARY DIAGNOSIS:   Past Medical History  Diagnosis Date  . Arthritis   . Asthma   . Chicken pox   . Diverticulitis   . Emphysema of lung (HCC)   . Frequent headaches   . Allergy   . Hypertension   . Arrhythmia   . History of colon polyps     HOSPITAL COURSE:  [name]  is a 82 y.o. male admitted 09/22/2015 with chief complaint Leg Swelling . Please see H&P performed by Delfino Lovett, MD for further information. Patient admitted with the above symptoms also noted to be in atrial fibrillation with rapid ventricular response.  Cardiology assisted with management during this hospitalization. We eventually achieved better controlled heart rate with amiodarone. With improved heart rate and continued diuresis his symptoms improved. His course was complicated by altered mental status with agitation. Requiring a sitter for a few days as well as started on Seroquel in the hospital. It is most likely baseline dementia with sundowning - as the family were well aware of this from prior admissions to the hospital. He was evaluated by physical therapy who recommended SNF but patient and family at this time would like to return home. DISCHARGE CONDITIONS:    stable  CONSULTS OBTAINED:  Treatment Team:  Antonieta Iba, MD  DRUG ALLERGIES:  No Known Allergies  DISCHARGE MEDICATIONS:   Current Discharge Medication List    START taking these medications   Details  amiodarone (PACERONE) 200 MG tablet Take 1 tablet (200 mg total) by mouth 2 (two) times daily. Qty: 60 tablet, Refills: 0    aspirin EC 81 MG EC tablet Take 1 tablet (81 mg total) by mouth daily. Qty: 30 tablet, Refills: 0    furosemide (LASIX) 40 MG tablet Take 1 tablet (40 mg total) by mouth daily. Qty: 30 tablet, Refills: 0    potassium chloride SA (K-DUR,KLOR-CON) 20 MEQ tablet Take 1 tablet (20 mEq total) by mouth daily. Qty: 30 tablet, Refills: 0    spironolactone (ALDACTONE) 25 MG tablet Take 1 tablet (25 mg total) by mouth daily. Qty: 30 tablet, Refills: 0      CONTINUE these medications which have NOT CHANGED   Details  acetaminophen (TYLENOL) 325 MG tablet Take 650 mg by mouth every 6 (six) hours as needed for moderate pain.     albuterol (PROVENTIL HFA;VENTOLIN HFA) 108 (90 Base) MCG/ACT inhaler Inhale 2 puffs into the lungs every 6 (six) hours as needed for wheezing or shortness of breath. Qty: 1 Inhaler, Refills: 2   Associated Diagnoses: Chronic obstructive pulmonary disease, unspecified COPD type (HCC)    Fluticasone Furoate-Vilanterol 100-25 MCG/INH AEPB Inhale 1 puff into the lungs daily. Qty: 60 each, Refills: 3    tiotropium (SPIRIVA HANDIHALER) 18 MCG inhalation capsule Place 1 capsule (18 mcg total) into inhaler and  inhale daily. Qty: 30 capsule, Refills: 3         DISCHARGE INSTRUCTIONS:    DIET:  Cardiac diet  DISCHARGE CONDITION:  Stable  ACTIVITY:  Activity as tolerated  OXYGEN:  Home Oxygen: Yes.     Oxygen Delivery: 3l [place]  DISCHARGE LOCATION:  home   If you experience worsening of your admission symptoms, develop shortness of breath, life threatening emergency, suicidal or homicidal thoughts you must seek medical  attention immediately by calling 911 or calling your MD immediately  if symptoms less severe.  You Must read complete instructions/literature along with all the possible adverse reactions/side effects for all the Medicines you take and that have been prescribed to you. Take any new Medicines after you have completely understood and accpet all the possible adverse reactions/side effects.   Please note  You were cared for by a hospitalist during your hospital stay. If you have any questions about your discharge medications or the care you received while you were in the hospital after you are discharged, you can call the unit and asked to speak with the hospitalist on call if the hospitalist that took care of you is not available. Once you are discharged, your primary care physician will handle any further medical issues. Please note that NO REFILLS for any discharge medications will be authorized once you are discharged, as it is imperative that you return to your primary care physician (or establish a relationship with a primary care physician if you do not have one) for your aftercare needs so that they can reassess your need for medications and monitor your lab values.    On the day of Discharge:   VITAL SIGNS:  Blood pressure 134/86, pulse 123, temperature 98.2 F (36.8 C), temperature source Oral, resp. rate 18, height  (1.854 m), weight 84.369 kg (186 lb), SpO2 92 %.  I/O:   Intake/Output Summary (Last 24 hours) at 09/29/15 1120 Last data filed at 09/29/15 1027  Gross per 24 hour  Intake    480 ml  Output    450 ml  Net     30 ml    PHYSICAL EXAMINATION:  GENERAL:  82 y.o.-year-old patient lying in the bed with no acute distress.  EYES: Pupils equal, round, reactive to light and accommodation. No scleral icterus. Extraocular muscles intact.  HEENT: Head atraumatic, normocephalic. Oropharynx and nasopharynx clear.  NECK:  Supple, no jugular venous distention. No thyroid  enlargement, no tenderness.  LUNGS: Normal breath sounds bilaterally, no wheezing, rales,rhonchi or crepitation. No use of accessory muscles of respiration.  CARDIOVASCULAR: S1, S2 irregular. No murmurs, rubs, or gallops.  ABDOMEN: Soft, non-tender, non-distended. Bowel sounds present. No organomegaly or mass.  EXTREMITIES: No pedal edema, cyanosis, or clubbing.  NEUROLOGIC: Cranial nerves II through XII are intact. Muscle strength 5/5 in all extremities. Sensation intact. Gait not checked.  PSYCHIATRIC: The patient is alert and oriented SKIN: No obvious rash, lesion, or ulcer.   DATA REVIEW:   CBC  Recent Labs Lab 09/27/15 0606  WBC 8.9  HGB 15.2  HCT 45.4  PLT 157    Chemistries   Recent Labs Lab 09/22/15 1428  09/27/15 0606  NA  --   < > 138  K  --   < > 4.2  CL  --   < > 102  CO2  --   < > 28  GLUCOSE  --   < > 116*  BUN  --   < > 22*  CREATININE  --   < > 0.93  CALCIUM  --   < > 8.8*  MG 2.1  --   --   < > = values in this interval not displayed.  Cardiac Enzymes  Recent Labs Lab 09/22/15 2345  TROPONINI 0.19*    Microbiology Results  No results found for this or any previous visit.  RADIOLOGY:  No results found.   Management plans discussed with the patient, family and they are in agreement.  CODE STATUS:     Code Status Orders        Start     Ordered   09/22/15 1404  Full code   Continuous     09/22/15 1403    Code Status History    Date Active Date Inactive Code Status Order ID Comments User Context   This patient has a current code status but no historical code status.    Advance Directive Documentation        Most Recent Value   Type of Advance Directive  Healthcare Power of Attorney, Living will   Pre-existing out of facility DNR order (yellow form or pink MOST form)     "MOST" Form in Place?        TOTAL TIME TAKING CARE OF THIS PATIENT: 33 minutes.    [name],  [name].D on 09/29/2015 at 11:20 AM  Between 7am to 6pm -  Pager - [phone]  After 6pm go to [email or web address] - Social research officer, government  [employer] [place]  Office  [phone]  CC: Primary care physician; Tillman Abide, MD

## 2015-09-29 NOTE — Care Management (Signed)
Patient has qualified for home 02.  Referral called to [facility].  obtained script for hospital bed in the event the patient agrees to it.  have medical necessity statement for the bed.

## 2015-09-29 NOTE — Care Management (Signed)
HOSPITAL BED MEDICAL NECESSITY     Patient suffers from  chf and has trouble breathing at night when head is elevated less  than 30  degrees. Bed wedges do not provide enough elevation to resolve breathing issues. Shortness of breath cause patient to require frequent changes in body position which cannot be achieved with a normal bed.

## 2015-09-29 NOTE — Progress Notes (Signed)
Home oxygen has been delivered and home health set up. Discharge instructions given to daughter along with prescriptions. Education given on heart failure and all new medications. Follow up appointments made. Questions answered. IV and tele removed.

## 2015-09-29 NOTE — Care Management Important Message (Signed)
Important Message  Patient Details  Name: [name] MRN: [id number] Date of Birth: Mar 27, 1926   Medicare Important Message Given:  Yes    [name] [name] 09/29/2015, 11:22 AM

## 2015-09-29 NOTE — Progress Notes (Signed)
Physical Therapy Treatment Patient Details Name: TREVONTE TERRASI MRN: [id number] DOB: [birth year] Today's Date: 09/29/2015    History of Present Illness Max Shirazi is a 82 y.o. male with a known history of paroxysmal A. fib is being admitted for CHF exacerbation. He has been having breathing problems last 6 months to 1 year has been gotten worse over last 1-2 days. He has also been having worsening lower shortness swelling noticed by family About 2 days ago, although likely been there for a while.     PT Comments    Pt in bed agitated "nobody is doing anything"  Discussed at length reasons for hospitalization and cardiac concerns.  Pt to edge of bed with rail and supervision.  Pt able to sit edge of bed with supervision and no loss of balance.  Pt was able to stand at bedside with min a x 1.  HR was monitored through out session by telemetry varied from high 50's to 108 at highest when standing.  It was noted to fluctuate significantly during sitting.  Pt educated on fluctuations and cardiac concerns.  Ambulation held due to variations.  Pt stated he feels he will be able to manage at home independently upon discharge but is fearful of standing activities at bedside.  Discussed with primary nurse and MD.     Follow Up Recommendations  SNF     Equipment Recommendations  Rolling walker with 5" wheels    Recommendations for Other Services       Precautions / Restrictions Restrictions Weight Bearing Restrictions: No    Mobility  Bed Mobility               General bed mobility comments: Min A for repositioning upward in bed. Not tested up/out of bed due to level of lethargy. Pt falls asleep several times during session.   Transfers Overall transfer level: Needs assistance Equipment used: Rolling walker (2 wheeled) Transfers: Sit to/from Stand Sit to Stand: Min assist            Ambulation/Gait                 Stairs            Wheelchair Mobility     Modified Rankin (Stroke Patients Only)       Balance                                    Cognition Arousal/Alertness: Awake/alert Behavior During Therapy: Agitated Overall Cognitive Status: History of cognitive impairments - at baseline       Memory: Decreased short-term memory              Exercises      General Comments        Pertinent Vitals/Pain      Home Living                      Prior Function            PT Goals (current goals can now be found in the care plan section) Progress towards PT goals: Progressing toward goals    Frequency  Min 2X/week    PT Plan Current plan remains appropriate    Co-evaluation             End of Session   Activity Tolerance:  (Linited by writer due to heart rate swing - monitored  on tel) Patient left: in bed;with call bell/phone within reach;with nursing/sitter in room;with bed alarm set     Time: 1100-1125 PT Time Calculation (min) (ACUTE ONLY): 25 min  Charges:  $Therapeutic Exercise: 8-22 mins                    G Codes:      Danielle Dess, PTA 09/29/2015, 11:39 AM

## 2015-09-29 NOTE — Telephone Encounter (Signed)
-----   Message from Christell Faith sent at 09/29/2015  2:06 PM EDT ----- Regarding: tcm/ph CBrion Aliment  10/08/15 @ 1:30

## 2015-09-29 NOTE — Care Management (Addendum)
spoke with patient, his daughter [name] and wife.  Discussed the recommendation for short term skilled nursing placement and patient declining.  an helpAll acknowledge this decision and wish to return home.  [name] says that there will be family members that can help patient get into the house.  Decline the need for additional equipment in the home.  Patient is currently wearing 02 which is acute and has not been assessed for the need of home 02.  Patient and his wife will have [facility] inhome services initiated 09/30/2015.  Daughter Dennard Nip will be staying with patient and his wife.  Obtained order for home health nursing, physical therapy, occupational therapy for adls and energy conservation, aide (until [facility] initiates aide) and social work for long range planning and community resources.  Referral called to [facility] as referral was initiated 4/24. Patient is very firm in his decision to return to his home.

## 2015-09-30 ENCOUNTER — Telehealth: Payer: Self-pay | Admitting: *Deleted

## 2015-09-30 ENCOUNTER — Other Ambulatory Visit: Payer: Self-pay | Admitting: *Deleted

## 2015-09-30 NOTE — Telephone Encounter (Signed)
Transition Care Management Follow-up Telephone Call   Date discharged? 09/29/15   How have you been since you were released from the hospital? Fatigued and not feeling well - no change since discharge.   Do you understand why you were in the hospital? yes   Do you understand the discharge instructions? yes   Where were you discharged to? Home - patient declined SNF placement   Items Reviewed:  Medications reviewed: yes  Allergies reviewed: yes  Dietary changes reviewed: no Referrals reviewed: cardiology 10/08/15  Functional Questionnaire:   Activities of Daily Living (ADLs):   He states they are independent in the following: feeding, continence, grooming and dressing States they require assistance with the following: ambulation, bathing and hygiene and toileting   Any transportation issues/concerns?: no   Any patient concerns? no   Confirmed importance and date/time of follow-up visits scheduled yes, 10/05/15 @ 1200  Provider Appointment booked with Tillman Abide, MD  Confirmed with patient if condition begins to worsen call PCP or go to the ER.  Patient was given the office number and encouraged to call back with question or concerns.  : yes

## 2015-09-30 NOTE — Patient Outreach (Signed)
Transition of care call  ( week 1, pt discharged 4/25).   Spoke with person who identified herself as pt's spouse Darl Pikes (on [facility] consent form), reports pt is not available.   RN CM provided  name and contact number  with request to have pt return phone call.    If no return phone call, will call pt again.   Shayne Alken.   Omaria Plunk RN CCM [facility] Care Management  [phone]

## 2015-10-01 DIAGNOSIS — Z9981 Dependence on supplemental oxygen: Secondary | ICD-10-CM | POA: Diagnosis not present

## 2015-10-01 DIAGNOSIS — Z87891 Personal history of nicotine dependence: Secondary | ICD-10-CM | POA: Diagnosis not present

## 2015-10-01 DIAGNOSIS — I110 Hypertensive heart disease with heart failure: Secondary | ICD-10-CM | POA: Diagnosis not present

## 2015-10-01 DIAGNOSIS — J45909 Unspecified asthma, uncomplicated: Secondary | ICD-10-CM | POA: Diagnosis not present

## 2015-10-01 DIAGNOSIS — I480 Paroxysmal atrial fibrillation: Secondary | ICD-10-CM | POA: Diagnosis not present

## 2015-10-01 DIAGNOSIS — J439 Emphysema, unspecified: Secondary | ICD-10-CM | POA: Diagnosis not present

## 2015-10-01 DIAGNOSIS — I5023 Acute on chronic systolic (congestive) heart failure: Secondary | ICD-10-CM | POA: Diagnosis not present

## 2015-10-01 DIAGNOSIS — Z7982 Long term (current) use of aspirin: Secondary | ICD-10-CM | POA: Diagnosis not present

## 2015-10-01 DIAGNOSIS — M1990 Unspecified osteoarthritis, unspecified site: Secondary | ICD-10-CM | POA: Diagnosis not present

## 2015-10-02 ENCOUNTER — Encounter: Payer: Self-pay | Admitting: *Deleted

## 2015-10-02 ENCOUNTER — Other Ambulatory Visit: Payer: Self-pay | Admitting: *Deleted

## 2015-10-02 NOTE — Patient Outreach (Signed)
Transition of care call successful (week 1, discharged 4/25).  Spoke with pt's spouse Darl Pikes (on [facility] consent form), states pt not  available, HIPPA verified on pt.  Spouse reports pt doing well physically but has so many bruises/cuts (skin tears) that he came home with, keeping dressings on.     Pt reports pt  has a HH caregiver to assist,  Pt started on  O2 3 L - doing well with it.   Spouse reports swelling in leg/feet going down, weighing daily but not recording.   Spouse reports daughter and granddaughter close by, assist as needed, daughter takes pt to MD appointments.  Spouse states either she or daughter does pt's pill planner, monitors pt when taking medications.   Discussed with spouse [facility] transition of care program (weekly phone calls 31 days post discharge, home visit), that this RN CM is covering for coworker Su Hilt RN CM who will be f/u with pt next week.    Plan to send in basket to  coworker Su Hilt RN CM, provide update on transition of care call  Plan to inform Dr. Alphonsus Sias of [facility] involvement- send barrier letter by in basket.   Shayne Alken.   Pierzchala RN CCM [facility] Care Management  [phone]

## 2015-10-03 DIAGNOSIS — I480 Paroxysmal atrial fibrillation: Secondary | ICD-10-CM | POA: Diagnosis not present

## 2015-10-03 DIAGNOSIS — I5023 Acute on chronic systolic (congestive) heart failure: Secondary | ICD-10-CM | POA: Diagnosis not present

## 2015-10-03 DIAGNOSIS — J45909 Unspecified asthma, uncomplicated: Secondary | ICD-10-CM | POA: Diagnosis not present

## 2015-10-03 DIAGNOSIS — J439 Emphysema, unspecified: Secondary | ICD-10-CM | POA: Diagnosis not present

## 2015-10-03 DIAGNOSIS — I110 Hypertensive heart disease with heart failure: Secondary | ICD-10-CM | POA: Diagnosis not present

## 2015-10-03 DIAGNOSIS — M1990 Unspecified osteoarthritis, unspecified site: Secondary | ICD-10-CM | POA: Diagnosis not present

## 2015-10-05 ENCOUNTER — Ambulatory Visit: Payer: Medicare Other | Admitting: Internal Medicine

## 2015-10-05 ENCOUNTER — Telehealth: Payer: Self-pay

## 2015-10-05 DIAGNOSIS — I480 Paroxysmal atrial fibrillation: Secondary | ICD-10-CM | POA: Diagnosis not present

## 2015-10-05 DIAGNOSIS — J439 Emphysema, unspecified: Secondary | ICD-10-CM | POA: Diagnosis not present

## 2015-10-05 DIAGNOSIS — J45909 Unspecified asthma, uncomplicated: Secondary | ICD-10-CM | POA: Diagnosis not present

## 2015-10-05 DIAGNOSIS — I110 Hypertensive heart disease with heart failure: Secondary | ICD-10-CM | POA: Diagnosis not present

## 2015-10-05 DIAGNOSIS — I5023 Acute on chronic systolic (congestive) heart failure: Secondary | ICD-10-CM | POA: Diagnosis not present

## 2015-10-05 DIAGNOSIS — M1990 Unspecified osteoarthritis, unspecified site: Secondary | ICD-10-CM | POA: Diagnosis not present

## 2015-10-05 NOTE — Telephone Encounter (Signed)
PLEASE NOTE: All timestamps contained within this report are represented as [place] Standard Time. CONFIDENTIALTY NOTICE: This fax transmission is intended only for the addressee. It contains information that is legally privileged, confidential or otherwise protected from use or disclosure. If you are not the intended recipient, you are strictly prohibited from reviewing, disclosing, copying using or disseminating any of this information or taking any action in reliance on or regarding this information. If you have received this fax in error, please notify us immediately by telephone so that we can arrange for its return to [place]. Phone: [phone], Toll-Free: [phone], Fax: [phone] Page: 1 of 1 Call Id: [id number] [facility] Night - Client Nonclinical Telephone Record [facility] Medical Call Center Client [facility] Night - Client Client Site  [facility] [place] - Night Physician Tillman Abide - MD Contact Type Call Call Type Home Care Hospice Page Now Who Is Calling Home Health / Hospice Agency Caller Name [facility] Name [facility] Facility Number [phone] Patient Name [name] Patient DOB May 03, 1926 Reason for Call Request to speak to Physician Initial Comment Juliette Alcide with [facility] at [phone]. Needs to speak with on call. Additional Comment Paging DoctorName Phone DateTime Result/Outcome Message Type Notes Santiago Bumpers - MD [phone] 10/03/2015 2:54:07 PM Paged On Call Back to Call Center Doctor Paged Please call Huntley Dec at [phone] Santiago Bumpers - MD 10/03/2015 2:59:33 PM Spoke with On Call - General Message Result Call Closed By: Neta Ehlers Transaction Date/Time: 10/03/2015 2:47:07 PM (ET)

## 2015-10-05 NOTE — Telephone Encounter (Signed)
Not sure what the home health calling about. I will review all at his OV tomorrow

## 2015-10-05 NOTE — Telephone Encounter (Signed)
Pt has appt on 10/06/15 at 12 noon with Dr Alphonsus Sias as F/U from [facility]/

## 2015-10-05 NOTE — Telephone Encounter (Signed)
Molli Knock will review meds and BP at tomorrow's appt

## 2015-10-05 NOTE — Telephone Encounter (Signed)
Mardella Layman nurse with [facility] left v/m; weekend nurse found BP to be low over weekend; lasix, K and spironolactone was held over weekend and reck BP on 10/05/15; Mardella Layman saw pt earlier today and BP was 100/62 P 88.

## 2015-10-06 ENCOUNTER — Encounter: Payer: Self-pay | Admitting: Internal Medicine

## 2015-10-06 ENCOUNTER — Ambulatory Visit (INDEPENDENT_AMBULATORY_CARE_PROVIDER_SITE_OTHER): Payer: Medicare Other | Admitting: Internal Medicine

## 2015-10-06 VITALS — BP 90/60 | HR 68 | Temp 97.4°F | Wt 188.0 lb

## 2015-10-06 DIAGNOSIS — J45909 Unspecified asthma, uncomplicated: Secondary | ICD-10-CM | POA: Diagnosis not present

## 2015-10-06 DIAGNOSIS — I480 Paroxysmal atrial fibrillation: Secondary | ICD-10-CM

## 2015-10-06 DIAGNOSIS — I5042 Chronic combined systolic (congestive) and diastolic (congestive) heart failure: Secondary | ICD-10-CM | POA: Diagnosis not present

## 2015-10-06 DIAGNOSIS — J438 Other emphysema: Secondary | ICD-10-CM | POA: Diagnosis not present

## 2015-10-06 DIAGNOSIS — J439 Emphysema, unspecified: Secondary | ICD-10-CM | POA: Diagnosis not present

## 2015-10-06 DIAGNOSIS — I110 Hypertensive heart disease with heart failure: Secondary | ICD-10-CM | POA: Diagnosis not present

## 2015-10-06 DIAGNOSIS — J9611 Chronic respiratory failure with hypoxia: Secondary | ICD-10-CM

## 2015-10-06 DIAGNOSIS — R410 Disorientation, unspecified: Secondary | ICD-10-CM

## 2015-10-06 DIAGNOSIS — M1990 Unspecified osteoarthritis, unspecified site: Secondary | ICD-10-CM | POA: Diagnosis not present

## 2015-10-06 DIAGNOSIS — I5023 Acute on chronic systolic (congestive) heart failure: Secondary | ICD-10-CM | POA: Diagnosis not present

## 2015-10-06 NOTE — Assessment & Plan Note (Signed)
Regular on the amiodarone now Will recheck labs and thyroid next visit

## 2015-10-06 NOTE — Assessment & Plan Note (Signed)
Known diastolic failure---EF 25% on recent echo Need to weight daily Restart furosemide despite low BP---hold off on spironolactone May not need the 24 hour care for now

## 2015-10-06 NOTE — Assessment & Plan Note (Signed)
No Rx for this Adds to the respiratory failure

## 2015-10-06 NOTE — Assessment & Plan Note (Signed)
In hospital Better now Doesn't seem to have sig cognitive issues at baseline

## 2015-10-06 NOTE — Assessment & Plan Note (Signed)
He is reluctant to use oxygen all the time---but needs it Counseled on this
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
## 2015-10-07 ENCOUNTER — Telehealth: Payer: Self-pay

## 2015-10-07 DIAGNOSIS — I5023 Acute on chronic systolic (congestive) heart failure: Secondary | ICD-10-CM | POA: Diagnosis not present

## 2015-10-07 DIAGNOSIS — I480 Paroxysmal atrial fibrillation: Secondary | ICD-10-CM | POA: Diagnosis not present

## 2015-10-07 DIAGNOSIS — J45909 Unspecified asthma, uncomplicated: Secondary | ICD-10-CM | POA: Diagnosis not present

## 2015-10-07 DIAGNOSIS — J439 Emphysema, unspecified: Secondary | ICD-10-CM | POA: Diagnosis not present

## 2015-10-07 DIAGNOSIS — I110 Hypertensive heart disease with heart failure: Secondary | ICD-10-CM | POA: Diagnosis not present

## 2015-10-07 DIAGNOSIS — M1990 Unspecified osteoarthritis, unspecified site: Secondary | ICD-10-CM | POA: Diagnosis not present

## 2015-10-07 NOTE — Telephone Encounter (Signed)
Mardella Layman nurse with [facility] left v/m that pts wife just called Mardella Layman; pt is more aggitated today; pt refusing oxygen today. More back pain than usual chronic back pain. Pt last seen 10/06/15. Lindsey request cb.

## 2015-10-08 ENCOUNTER — Other Ambulatory Visit: Payer: Self-pay | Admitting: *Deleted

## 2015-10-08 ENCOUNTER — Encounter: Payer: Medicare Other | Admitting: Nurse Practitioner

## 2015-10-08 DIAGNOSIS — J45909 Unspecified asthma, uncomplicated: Secondary | ICD-10-CM | POA: Diagnosis not present

## 2015-10-08 DIAGNOSIS — I110 Hypertensive heart disease with heart failure: Secondary | ICD-10-CM | POA: Diagnosis not present

## 2015-10-08 DIAGNOSIS — I5023 Acute on chronic systolic (congestive) heart failure: Secondary | ICD-10-CM | POA: Diagnosis not present

## 2015-10-08 DIAGNOSIS — I480 Paroxysmal atrial fibrillation: Secondary | ICD-10-CM | POA: Diagnosis not present

## 2015-10-08 DIAGNOSIS — M1990 Unspecified osteoarthritis, unspecified site: Secondary | ICD-10-CM | POA: Diagnosis not present

## 2015-10-08 DIAGNOSIS — J439 Emphysema, unspecified: Secondary | ICD-10-CM | POA: Diagnosis not present

## 2015-10-08 MED ORDER — TRAMADOL HCL 50 MG PO TABS: ORAL_TABLET | ORAL | Status: DC

## 2015-10-08 NOTE — Telephone Encounter (Signed)
Please check with wife and see what is going on

## 2015-10-08 NOTE — Telephone Encounter (Signed)
They should try giving him tylenol regularly---like 650mg  tid If he is already taking that, we can try tramadol 25-50mg  tid prn for the pain (#30 x 0)

## 2015-10-08 NOTE — Telephone Encounter (Signed)
Spoke to [place]. She is seeing him at 230 today. She will find out and let us know if we need to send in tramadol.

## 2015-10-08 NOTE — Patient Outreach (Signed)
[facility]) Care Management  10/08/2015  [name] 03/04/1926 [id number]  Successful telephone outreach to [name], wife of [name], followed by [facility] CM for transition of care after recent IP hospital visit, April 18-25, 2017 for A-Fib with RVR and CHF management.  HIPAA verified.  Today, [name] reports that [name] is doing well in his recuperation after his hospital visit.  [name] reports that [name] had a follow up OV earlier this week with his PCP Dr. Alphonsus Sias, and stated that [name] has "stuck to all the great advice they gave [place]."  [name] reports that [name] has been wearing his O2 as advised by his PCP, taking all of his medications as prescribed, and working with [facility] RN and PT, both of which visited the patient today.  [name] also reports that [name] is eating well, and that they did begin recording his daily weights as advised by Jodi Mourning, [facility] RN CM last week with the initial [facility] Community CM TOC follow up call.  [name] reports that [name]'s weight this morning was "165 pounds," and states that his weight has not changed since he was discharged home from the hospital.  [facility] Community CM services discussed thoroughly with [name], and we scheduled an initial [facility] Community CM in-home visit for later this month during our conversation today.  I also made sure that the [name]'s had my name and direct contact information should they wish to contact me before next [facility] Community CM telephone outreach.  Plan: [name] will continue to take his medications and wear his O2 as prescribed post-discharge from hospital. [name] will continue working with [facility] RN and PT services as ordered post-hospital discharge. [name] will continue monitoring and recording his daily weights, and will contact his provider if he gains > 2 lbs overnight or > 5 lbs in one week. [name] will contact his medical provider team should  concerns, problems, or issues arise. Continued [facility] Community CM follow up for transition of care planned with telephone outreach next week and scheduled in-home visit later this month.  Caryl Pina, RN, BSN, [employer] [facility] Care Management  [phone]

## 2015-10-08 NOTE — Telephone Encounter (Signed)
[name] called back and said he takes his tylenol religiously. I have called in a rx for tramadol

## 2015-10-08 NOTE — Addendum Note (Signed)
Addended by: Eual Fines on: 10/08/2015 02:52 PM   Modules accepted: Orders

## 2015-10-08 NOTE — Telephone Encounter (Signed)
She said wife called yesterday and said he was very aggrevated. He just wanted everyone to leave him alone because his back hurts. Wanted to know if Dr Alphonsus Sias had any ideas for his back pain or help decrease the aggitation.

## 2015-10-09 ENCOUNTER — Ambulatory Visit: Payer: Medicare Other | Admitting: Family

## 2015-10-09 DIAGNOSIS — J439 Emphysema, unspecified: Secondary | ICD-10-CM | POA: Diagnosis not present

## 2015-10-09 DIAGNOSIS — I480 Paroxysmal atrial fibrillation: Secondary | ICD-10-CM | POA: Diagnosis not present

## 2015-10-09 DIAGNOSIS — J45909 Unspecified asthma, uncomplicated: Secondary | ICD-10-CM | POA: Diagnosis not present

## 2015-10-09 DIAGNOSIS — I110 Hypertensive heart disease with heart failure: Secondary | ICD-10-CM | POA: Diagnosis not present

## 2015-10-09 DIAGNOSIS — M1990 Unspecified osteoarthritis, unspecified site: Secondary | ICD-10-CM | POA: Diagnosis not present

## 2015-10-09 DIAGNOSIS — I5023 Acute on chronic systolic (congestive) heart failure: Secondary | ICD-10-CM | POA: Diagnosis not present

## 2015-10-12 DIAGNOSIS — I5023 Acute on chronic systolic (congestive) heart failure: Secondary | ICD-10-CM | POA: Diagnosis not present

## 2015-10-12 DIAGNOSIS — J439 Emphysema, unspecified: Secondary | ICD-10-CM | POA: Diagnosis not present

## 2015-10-12 DIAGNOSIS — I110 Hypertensive heart disease with heart failure: Secondary | ICD-10-CM | POA: Diagnosis not present

## 2015-10-12 DIAGNOSIS — I480 Paroxysmal atrial fibrillation: Secondary | ICD-10-CM | POA: Diagnosis not present

## 2015-10-12 DIAGNOSIS — J45909 Unspecified asthma, uncomplicated: Secondary | ICD-10-CM | POA: Diagnosis not present

## 2015-10-12 DIAGNOSIS — M1990 Unspecified osteoarthritis, unspecified site: Secondary | ICD-10-CM | POA: Diagnosis not present

## 2015-10-13 ENCOUNTER — Ambulatory Visit: Payer: Medicare Other | Attending: Family | Admitting: Family

## 2015-10-13 ENCOUNTER — Encounter: Payer: Self-pay | Admitting: Family

## 2015-10-13 VITALS — BP 101/52 | HR 62 | Resp 20 | Ht 73.0 in | Wt 189.0 lb

## 2015-10-13 DIAGNOSIS — R0602 Shortness of breath: Secondary | ICD-10-CM | POA: Insufficient documentation

## 2015-10-13 DIAGNOSIS — Z9889 Other specified postprocedural states: Secondary | ICD-10-CM | POA: Diagnosis not present

## 2015-10-13 DIAGNOSIS — R5383 Other fatigue: Secondary | ICD-10-CM | POA: Insufficient documentation

## 2015-10-13 DIAGNOSIS — I5032 Chronic diastolic (congestive) heart failure: Secondary | ICD-10-CM | POA: Insufficient documentation

## 2015-10-13 DIAGNOSIS — Z87891 Personal history of nicotine dependence: Secondary | ICD-10-CM | POA: Diagnosis not present

## 2015-10-13 DIAGNOSIS — Z8261 Family history of arthritis: Secondary | ICD-10-CM | POA: Insufficient documentation

## 2015-10-13 DIAGNOSIS — B019 Varicella without complication: Secondary | ICD-10-CM | POA: Diagnosis not present

## 2015-10-13 DIAGNOSIS — Z801 Family history of malignant neoplasm of trachea, bronchus and lung: Secondary | ICD-10-CM | POA: Insufficient documentation

## 2015-10-13 DIAGNOSIS — Z811 Family history of alcohol abuse and dependence: Secondary | ICD-10-CM | POA: Diagnosis not present

## 2015-10-13 DIAGNOSIS — I4891 Unspecified atrial fibrillation: Secondary | ICD-10-CM | POA: Diagnosis not present

## 2015-10-13 DIAGNOSIS — Z79899 Other long term (current) drug therapy: Secondary | ICD-10-CM | POA: Insufficient documentation

## 2015-10-13 DIAGNOSIS — J439 Emphysema, unspecified: Secondary | ICD-10-CM | POA: Insufficient documentation

## 2015-10-13 DIAGNOSIS — M1990 Unspecified osteoarthritis, unspecified site: Secondary | ICD-10-CM | POA: Insufficient documentation

## 2015-10-13 DIAGNOSIS — Z7982 Long term (current) use of aspirin: Secondary | ICD-10-CM | POA: Diagnosis not present

## 2015-10-13 DIAGNOSIS — I480 Paroxysmal atrial fibrillation: Secondary | ICD-10-CM

## 2015-10-13 DIAGNOSIS — Z79891 Long term (current) use of opiate analgesic: Secondary | ICD-10-CM | POA: Diagnosis not present

## 2015-10-13 DIAGNOSIS — J432 Centrilobular emphysema: Secondary | ICD-10-CM

## 2015-10-13 DIAGNOSIS — K5792 Diverticulitis of intestine, part unspecified, without perforation or abscess without bleeding: Secondary | ICD-10-CM | POA: Diagnosis not present

## 2015-10-13 DIAGNOSIS — I5022 Chronic systolic (congestive) heart failure: Secondary | ICD-10-CM

## 2015-10-13 DIAGNOSIS — R609 Edema, unspecified: Secondary | ICD-10-CM | POA: Diagnosis not present

## 2015-10-13 DIAGNOSIS — Z86010 Personal history of colonic polyps: Secondary | ICD-10-CM | POA: Diagnosis not present

## 2015-10-13 DIAGNOSIS — Z8249 Family history of ischemic heart disease and other diseases of the circulatory system: Secondary | ICD-10-CM | POA: Insufficient documentation

## 2015-10-13 DIAGNOSIS — I110 Hypertensive heart disease with heart failure: Secondary | ICD-10-CM | POA: Diagnosis not present

## 2015-10-13 DIAGNOSIS — J45909 Unspecified asthma, uncomplicated: Secondary | ICD-10-CM | POA: Insufficient documentation

## 2015-10-13 NOTE — Patient Instructions (Addendum)
Continue weighing daily and call for an overnight weight gain of > 2 pounds or a weekly weight gain of >5 pounds. 

## 2015-10-13 NOTE — Progress Notes (Signed)
Subjective:    Patient ID: [name], male    DOB: 1926/03/14, 82 y.o.   MRN: [id number]  Congestive Heart Failure Presents for initial visit. The disease course has been stable. Associated symptoms include edema, fatigue and shortness of breath. Pertinent negatives include no abdominal pain, chest pain, chest pressure, orthopnea or palpitations. The symptoms have been stable. Past treatments include oxygen and salt and fluid restriction. The treatment provided moderate relief. Compliance with prior treatments has been variable. Prior compliance problems include difficulty with treatment plan. His past medical history is significant for arrhythmia, chronic lung disease and HTN. There is no history of CAD, CVA or DM. He has multiple 1st degree relatives with heart disease.  Atrial Fibrillation Presents for initial visit. Symptoms include shortness of breath. Symptoms are negative for bradycardia, chest pain, dizziness, hypotension, palpitations and tachycardia. The symptoms have been stable. Past treatments include antiarrhythmics and aspirin. Compliance with prior treatments has been good. Past medical history includes atrial fibrillation, CHF and HTN. There is no history of CAD. There are no medication compliance problems.   Past Medical History  Diagnosis Date  . Arthritis   . Asthma   . Chicken pox   . Diverticulitis   . Emphysema of lung (HCC)   . Frequent headaches   . Allergy   . Hypertension   . Arrhythmia   . History of colon polyps     Past Surgical History  Procedure Laterality Date  . Appendectomy    . Tonsillectomy    . Back surgery  ~2011    Lumbar fusion    Family History  Problem Relation Age of Onset  . Arthritis Mother   . Hypertension Mother   . Hyperlipidemia Father   . Hypertension Father   . Alcohol abuse Brother   . Cancer Brother     Lung    Social History  Substance Use Topics  . Smoking status: Former Smoker -- 0.25 packs/day for 60 years   Types: Cigarettes    Quit date: 08/05/2014  . Smokeless tobacco: Former Neurosurgeon     Comment: quit x 1 month  . Alcohol Use: No    No Known Allergies  Prior to Admission medications   Medication Sig Start Date End Date Taking? Authorizing Provider  acetaminophen (TYLENOL) 325 MG tablet Take 650 mg by mouth every 6 (six) hours as needed for moderate pain.    Yes Historical Provider, MD  albuterol (PROVENTIL HFA;VENTOLIN HFA) 108 (90 Base) MCG/ACT inhaler Inhale 2 puffs into the lungs every 6 (six) hours as needed for wheezing or shortness of breath. 08/04/15  Yes Karie Schwalbe, MD  amiodarone (PACERONE) 200 MG tablet Take 1 tablet (200 mg total) by mouth 2 (two) times daily. 09/29/15  Yes Wyatt Haste, MD  aspirin EC 81 MG EC tablet Take 1 tablet (81 mg total) by mouth daily. 09/29/15  Yes Wyatt Haste, MD  Fluticasone Furoate-Vilanterol 100-25 MCG/INH AEPB Inhale 1 puff into the lungs daily. 07/17/15  Yes Karie Schwalbe, MD  furosemide (LASIX) 40 MG tablet Take 40 mg by mouth.   Yes Historical Provider, MD  tiotropium (SPIRIVA HANDIHALER) 18 MCG inhalation capsule Place 1 capsule (18 mcg total) into inhaler and inhale daily. 08/04/15  Yes Karie Schwalbe, MD  traMADol (ULTRAM) 50 MG tablet 1/2 to 1 tablet three times a day as needed for pain. 10/08/15  Yes Karie Schwalbe, MD      Review of Systems  Constitutional:  Positive for fatigue. Negative for appetite change.  HENT: Negative for congestion, postnasal drip and sore throat.   Eyes: Negative.   Respiratory: Positive for shortness of breath. Negative for cough, chest tightness and wheezing.   Cardiovascular: Positive for leg swelling (right ankle). Negative for chest pain and palpitations.  Gastrointestinal: Negative for abdominal pain and abdominal distention.  Endocrine: Negative.   Genitourinary: Negative.   Musculoskeletal: Negative for back pain and neck pain.  Skin: Negative.   Allergic/Immunologic: Negative.   Neurological:  Negative for dizziness, light-headedness and headaches.  Hematological: Negative for adenopathy. Bruises/bleeds easily.  Psychiatric/Behavioral: Positive for sleep disturbance (waking up frequently. oxygen @ 2L intermittent) and dysphoric mood. The patient is not nervous/anxious.        Objective:   Physical Exam  Constitutional: He is oriented to person, place, and time. He appears well-developed and well-nourished.  HENT:  Head: Normocephalic and atraumatic.  Eyes: Conjunctivae are normal. Pupils are equal, round, and reactive to light.  Neck: Normal range of motion. Neck supple.  Cardiovascular: Normal rate and regular rhythm.   Pulmonary/Chest: Effort normal. He has no wheezes. He has no rales.  Abdominal: Soft. He exhibits no distension.  Musculoskeletal: He exhibits edema (trace amount pitting edema in right ankle). He exhibits no tenderness.  Neurological: He is alert and oriented to person, place, and time.  Skin: Skin is warm and dry.  Psychiatric: His mood appears anxious. He is agitated. He is not aggressive. He exhibits a depressed mood.  Nursing note and vitals reviewed.   BP 101/52 mmHg  Pulse 62  Resp 20  Ht 6\' 1"  (1.854 m)  Wt 189 lb (85.73 kg)  BMI 24.94 kg/m2  SpO2 96%       Assessment & Plan:  1: Chronic heart failure with reduced ejection fraction- Patient presents with fatigue and shortness of breath with exertion (Class II). He says that he doesn't get short of breath with getting dressed but was a little short of breath upon walking into the office. Once he sits down for a few minutes, his breathing normalizes. He reports feeling weak because he's not "allowed to do anything" so he admits that he sits quite a bit. Encouraged him to be as active as possible although he says that he's quite irritated with having to use the walker. He is already weighing himself daily and says that his home weight has been stable although his wife admits that it's hard to see  the reading because it's an analog scale with the dial. His daughter says that she's going to get him a digital scale. Instructed them to call for an overnight weight gain of >2 pounds or a weekly weight gain of >5 pounds. He does not add salt to his food nor does his wife cook with food. They use pepper, Mrs. Dash or NoSalt for seasoning and are reading food labels. Discussed the importance of closely following a 2000mg  sodium diet and written information was given to them about that. Could consider adding entresto if his blood pressure allows. 2: Atrial fibrillation- Currently rate controlled with amiodarone. Also taking aspirin and follows closely with cardiology. 3: Emphysema- Continues to use his inhalers and does have oxygen at home that he's supposed to be wearing but he says that he doesn't wear it much because he feels like he "doesn't need it" and he, again, voices irritation regarding the tubing getting in the way when he walks as well as when he sleeps. Discussed the importance of  wearing it as prescribed and he was encouraged to speak with his PCP regarding this.   Medication bottles were reviewed.  Return here in 1 month or sooner for any questions/problems before then.

## 2015-10-14 ENCOUNTER — Telehealth: Payer: Self-pay

## 2015-10-14 ENCOUNTER — Other Ambulatory Visit: Payer: Self-pay | Admitting: *Deleted

## 2015-10-14 DIAGNOSIS — J45909 Unspecified asthma, uncomplicated: Secondary | ICD-10-CM | POA: Diagnosis not present

## 2015-10-14 DIAGNOSIS — J439 Emphysema, unspecified: Secondary | ICD-10-CM | POA: Diagnosis not present

## 2015-10-14 DIAGNOSIS — I110 Hypertensive heart disease with heart failure: Secondary | ICD-10-CM | POA: Diagnosis not present

## 2015-10-14 DIAGNOSIS — I480 Paroxysmal atrial fibrillation: Secondary | ICD-10-CM | POA: Diagnosis not present

## 2015-10-14 DIAGNOSIS — I5023 Acute on chronic systolic (congestive) heart failure: Secondary | ICD-10-CM | POA: Diagnosis not present

## 2015-10-14 DIAGNOSIS — M1990 Unspecified osteoarthritis, unspecified site: Secondary | ICD-10-CM | POA: Diagnosis not present

## 2015-10-14 NOTE — Telephone Encounter (Signed)
Dawn with [facility] saw pt earlier today; pt was belligerent and angry; pt yelled at [facility] and pts wife. Dawn wonders if possible UTI, sundowners transition, ? Electrolyte imbalance or dementia.should pt have labs and urine done or schedule appt at [facility], Dawn said pts daughter thinks this behavior is becoming more often. Dawn request cb. Pt seen 10/06/15.

## 2015-10-14 NOTE — Patient Outreach (Signed)
[facility]) Care Management [facility] Community CM Telephone [place] of Care day 13 10/14/2015  Max TREVYN CALDERON 07-Dec-1925 [id number]   Successful telephone outreach to [name], wife of Mr. Tej Klindt, followed by [facility] CM for transition of care after recent IP hospital visit, April 18-25, 2017 for A-Fib with RVR and CHF management. HIPAA verified.  Today, Mrs. Lafarga reports that Mr. Lasker is continuing to do well in his recuperation after his hospital visit; however, she reports that Mr. Hjort "is so combative," specifically stating that he is "not cooperative with his HH workers."  Upon further inquiry, Mrs. Shockley states that Mr. Vogelsang is "just irritated with them coming over," and states that he is A/O x 3, and has no changes in his baseline mental status.  Mrs. Robe states, "he just tends to be negative and irritated; I am trying to be patient with him, but it is very hard to watch him be so uncooperative and irritable to people who are trying to help him."  Emotional support and encouragement offered to Mrs. Berntsen.    Mrs. Pullum reports that Mr. Lottman has attended all provider follow up visits and has been wearing his O2 "most of the time." Mrs. Bricker reports that Mr. Dellaporta is taking all of his medications as prescribed, and eating well.  Mrs. Holzhausen also reports that Mr. Giamanco is monitoring and  recording his daily weights with her direction.  Mrs. Colmenares reports that Mr. Olkowski's weighed 165 pounds this morning, again stating that his weight has not changed since he was discharged home from the hospital.  Mrs. Wichert also confirms that they are monitoring and recording Mr. Lady's BP's regularly, "several times a week."  Mrs. Salt stated that Mr. Lenn has had no falls since his discharge home form the hospital, and confirmed that he "uses a walker all the time to get around."  Mrs. Ernandez confirms good support systems with their family members, and denies  transportation needs and community resource needs.  I confirmed initial [facility] Community CM in-home visit for later this month during our conversation today, and made sure that the Gault's had my name and direct contact information should they wish to contact me before next [facility] Community CM telephone outreach.  Plan: Mr. Macaskill will continue to take his medications and wear his O2 as prescribed post-discharge from hospital. Mr. Jude will continue working with [facility] RN and PT services as ordered post-hospital discharge. Mr. Plaut will continue monitoring and recording his daily weights, and will contact his provider if he gains > 2 lbs overnight or > 5 lbs in one week. Mr. Frerichs will contact his medical provider team should concerns, problems, or issues arise. Continued [facility] Community CM follow up for transition of care planned with telephone outreach next week and scheduled in-home visit later this month.  Caryl Pina, RN, BSN, [employer] [facility] Care Management  [phone]

## 2015-10-15 DIAGNOSIS — I110 Hypertensive heart disease with heart failure: Secondary | ICD-10-CM | POA: Diagnosis not present

## 2015-10-15 DIAGNOSIS — M1990 Unspecified osteoarthritis, unspecified site: Secondary | ICD-10-CM | POA: Diagnosis not present

## 2015-10-15 DIAGNOSIS — J439 Emphysema, unspecified: Secondary | ICD-10-CM | POA: Diagnosis not present

## 2015-10-15 DIAGNOSIS — I480 Paroxysmal atrial fibrillation: Secondary | ICD-10-CM | POA: Diagnosis not present

## 2015-10-15 DIAGNOSIS — J45909 Unspecified asthma, uncomplicated: Secondary | ICD-10-CM | POA: Diagnosis not present

## 2015-10-15 DIAGNOSIS — I5023 Acute on chronic systolic (congestive) heart failure: Secondary | ICD-10-CM | POA: Diagnosis not present

## 2015-10-15 NOTE — Telephone Encounter (Signed)
Spoke to [place], patient's wife. She said she thinks his reactions are related to resisting the helpers/nurses. She said she has told him he needed to be nicer to them. She said he is actually being more independent when they are not there. He has actually asked to use his oxygen! She appreciated [place] calling and talking to her. She does not feel he needs to move his appt up. She will call and ask for me if she sees that it does need to be moved.  Left a message for Dawn on her VM

## 2015-10-15 NOTE — Telephone Encounter (Signed)
This has been an issue since his discharge, he is resentful , etc There is no reason to think of UTI Check with his wife--if he is a management issue other than resenting the home health folks, we should push up his follow up

## 2015-10-20 ENCOUNTER — Encounter: Payer: Medicare Other | Admitting: Nurse Practitioner

## 2015-10-20 ENCOUNTER — Encounter: Payer: Self-pay | Admitting: *Deleted

## 2015-10-20 ENCOUNTER — Other Ambulatory Visit: Payer: Self-pay | Admitting: *Deleted

## 2015-10-20 NOTE — Patient Outreach (Signed)
[facility] Customer service manager [facility]) Care Management [facility] Community CM Telephone Outreach, Transition of Care, day 18 10/20/2015  Max JASKARAN LINEBERGER May 12, 1926 [id number]  Successful telephone outreach to Joellyn Quails, daughter of Mr. Iseah Norwick, followed by [facility] CM for transition of care after recent IP hospital visit, April 18-25, 2017 for A-Fib with RVR and CHF management. HIPAA verified.  Today, [name] reports that Mr. Haberberger is continuing to do well in his recuperation after his hospital visit; however, she acknowledged that mr. Duvernay has continued to be irritable, and states that he "refused to go to a visit with the nurse practitioner," stating that this was a follow up visit after his recent hospital visit.  [name] reports that he has had visits with [facility] RN and PT today, and states, "he has an attitude, I think he has some early dementia," acknowledging, "it makes it especially hard on my mother."  Emotional support and encouragement provided.   [name] reports that Mr. Lerew has been wearing his O2 "most of the time," stating, "you can't make him do anything he doesn't want to do."  [name] reports that she believes he is using his oxygen as prescribed, "probabaly at least 75% of the time."  [name] also reports that Mr. Dorschner is taking all of his medications as prescribed, eating well, and monitoring and recording his BP's and daily weights with her mother's direction.   [name] stated that she lives "very close" to her mother and father and assists with their care whenever needed around her work schedule, which varies, as she is a Runner, broadcasting/film/video.  [name] and I discussed [facility] CM services, and confirmed initial [facility] Community CM in-home visit for later this month during our conversation today.  I confirmed that the Graver's had my name and direct contact information should they wish to contact me before next [facility] Community CM telephone outreach.    Plan: Mr. Giamanco will continue to take his medications and  wear his O2 as prescribed post-discharge from hospital. [name] will continue working with [facility] RN and PT services as ordered post-hospital discharge. Mr. Renew will continue monitoring and recording his daily weights, and will contact his provider if he gains > 2 lbs overnight or > 5 lbs in one week. Mr. Gottschall will contact his medical provider team should concerns, problems, or issues arise. Continued [facility] Community CM follow up for transition of care planned with ongoing telephone outreach and scheduled in-home visit later this month.  Caryl Pina, RN, BSN, [employer] [facility] Care Management  [phone]

## 2015-10-21 DIAGNOSIS — I110 Hypertensive heart disease with heart failure: Secondary | ICD-10-CM | POA: Diagnosis not present

## 2015-10-21 DIAGNOSIS — I5023 Acute on chronic systolic (congestive) heart failure: Secondary | ICD-10-CM | POA: Diagnosis not present

## 2015-10-21 DIAGNOSIS — I480 Paroxysmal atrial fibrillation: Secondary | ICD-10-CM | POA: Diagnosis not present

## 2015-10-21 DIAGNOSIS — M1990 Unspecified osteoarthritis, unspecified site: Secondary | ICD-10-CM | POA: Diagnosis not present

## 2015-10-21 DIAGNOSIS — J439 Emphysema, unspecified: Secondary | ICD-10-CM | POA: Diagnosis not present

## 2015-10-21 DIAGNOSIS — J45909 Unspecified asthma, uncomplicated: Secondary | ICD-10-CM | POA: Diagnosis not present

## 2015-10-23 DIAGNOSIS — J439 Emphysema, unspecified: Secondary | ICD-10-CM | POA: Diagnosis not present

## 2015-10-23 DIAGNOSIS — I5023 Acute on chronic systolic (congestive) heart failure: Secondary | ICD-10-CM | POA: Diagnosis not present

## 2015-10-23 DIAGNOSIS — M1990 Unspecified osteoarthritis, unspecified site: Secondary | ICD-10-CM | POA: Diagnosis not present

## 2015-10-23 DIAGNOSIS — J45909 Unspecified asthma, uncomplicated: Secondary | ICD-10-CM | POA: Diagnosis not present

## 2015-10-23 DIAGNOSIS — I480 Paroxysmal atrial fibrillation: Secondary | ICD-10-CM | POA: Diagnosis not present

## 2015-10-23 DIAGNOSIS — I110 Hypertensive heart disease with heart failure: Secondary | ICD-10-CM | POA: Diagnosis not present

## 2015-10-26 ENCOUNTER — Ambulatory Visit: Payer: Medicare Other | Admitting: Internal Medicine

## 2015-10-26 ENCOUNTER — Telehealth: Payer: Self-pay | Admitting: Internal Medicine

## 2015-10-26 NOTE — Telephone Encounter (Signed)
Patient's daughter,[name],called.  Patient had appointment with Dr.Letvak today and he refuses to come.  [name] and patient's wife would like to discuss patient with Dr.Letvak.

## 2015-10-26 NOTE — Telephone Encounter (Signed)
She will let him settle down some--- then broach the issue of a follow up appointment (hopefully by next week) Doubt electrolyte disturbance Did have alprazolam in past--she wonders if this might be helpful. Discussed potential problems with this  He may be getting overloaded with [facility] and [facility] coming in--- told her to have [facility] step back for now (while [facility] is there)

## 2015-10-26 NOTE — Telephone Encounter (Signed)
Spoke to her He did have some good days last week Then got upset when daughter mentioned my office visit scheduled Paranoid --thought wife and daughter made up the appointment (and he was verbally belligerent) [facility] nurse wondering about electrolyte disturbance

## 2015-10-28 ENCOUNTER — Encounter: Payer: Self-pay | Admitting: *Deleted

## 2015-10-28 ENCOUNTER — Other Ambulatory Visit: Payer: Self-pay | Admitting: *Deleted

## 2015-10-28 DIAGNOSIS — J45909 Unspecified asthma, uncomplicated: Secondary | ICD-10-CM | POA: Diagnosis not present

## 2015-10-28 DIAGNOSIS — I110 Hypertensive heart disease with heart failure: Secondary | ICD-10-CM | POA: Diagnosis not present

## 2015-10-28 DIAGNOSIS — M1990 Unspecified osteoarthritis, unspecified site: Secondary | ICD-10-CM | POA: Diagnosis not present

## 2015-10-28 DIAGNOSIS — I480 Paroxysmal atrial fibrillation: Secondary | ICD-10-CM | POA: Diagnosis not present

## 2015-10-28 DIAGNOSIS — I5023 Acute on chronic systolic (congestive) heart failure: Secondary | ICD-10-CM | POA: Diagnosis not present

## 2015-10-28 DIAGNOSIS — J439 Emphysema, unspecified: Secondary | ICD-10-CM | POA: Diagnosis not present

## 2015-10-28 NOTE — Patient Outreach (Signed)
[facility]) Care Management   10/28/2015  [name] [birth year] [id number]  [name] is an 82 y.o. male followed by [facility] CM for transition of care after recent IP hospital visit, April 18-25, 2017 for A-Fib with RVR and CHF management.  [name] was discharged with home O2, and is currently being followed by [facility] for in-home care and monitoring, as well as DME needs.  Pt.'s wife, Darl Pikes and daughter [name] were present for today's in-home visit, and they confirmed along with [name] that [name] has been taking his medications as prescribed and weighing daily.  [name] reports that he is not receptive to recording his weights, stating, "I know what they are, and where I am supposed to be.... I don't need to write everything down to know what I am supposed to be doing."  [name] was recently discharged from hospital and all medications were reviewed with [name] and his family during today's in-home visit.  [name] and his family report that [name] has missed several of his provider appointments and has occasionally cancelled [facility] visits since his discharge home.  [name] and [name] verbalize frustration around being unable to convince [name] to do "what he should be and needs to be doing," adding "you can't make him do anything he doesn't want to."  [name] reports that he believes that [facility] PT is especially helpful to him, and states that he "likes" the [facility] PT "Deniece Portela."  [name] uses a walker to ambulate 100% of the time that he ambulates.  [name] reports that he wears his home O2 "at night" when he sleeps, and otherwise, "only when I feel like I need it."  [name] was receptive to [employer] RN CM making home visit today and in the future; [facility] CM services thoroughly discussed with [name] and his family today, as they expressed some confusion around the difference between CM and [facility] services; daughter [name] stated that "someone from [facility]  named [name] has already been out here to visit."  Together, we reviewed the EMR, and determined that the person [name] thought was associated with [facility] was actually a [facility] RN named "[name]," who visited patient on Oct 14, 2015, and placed a call to [name]'s PCP in follow up to her home visit.  Care collaboration call placed to [name], [facility] RN, during today's visit to clarify [facility] services.  [name] confirmed that PT is actively working with [name] 2-3 times per week, and that [facility] nursing services are transitioning down to every other week.  Today, we also discussed benefits of patients insurance company, such as provision of digital scales, as daughter [name] expressed some concerns that the current scales in the home "are not completely accurate."  [name]'s weighed himself during the home visit today, and the scales read a value of 181 pounds.  We also discussed available community resources, however, [name]'s and his family members denied need at the present time.  Subjective: "I will talk to you, but I want to be a part of everything that is discussed about me and my care."  Objective:  BP 128/72 mmHg  Pulse 64  Resp 18  Wt 181 lb (82.101 kg)  SpO2 98%     Review of Systems  Constitutional: Negative.  Negative for weight loss and diaphoresis.  Respiratory: Positive for shortness of breath. Negative for cough, sputum production and wheezing.   Cardiovascular:  Negative.  Negative for chest pain, palpitations and leg swelling.  Gastrointestinal: Negative.   Genitourinary: Negative.   Musculoskeletal: Negative.  Negative for back pain and falls.  Neurological: Negative for weakness.  Psychiatric/Behavioral: Negative for depression. The patient is nervous/anxious.     Physical Exam  Constitutional: He is oriented to person, place, and time. He appears well-developed and well-nourished.  Cardiovascular: Normal rate, regular rhythm, normal heart sounds and intact distal pulses.    Respiratory: No respiratory distress. He has no wheezes.  Patient SOB with minimal activity; recovers well after resting.  Bilateral breath sounds are decreased with minimal air flow appreciated upon auscultation of A/L/P lung fields.  Pt. Has O2 available for in-home prn use; patient states he wears O2 at 4 L/min "at night when I am sleeping."  GI: Soft. Bowel sounds are normal.  Musculoskeletal: He exhibits no edema.  Patient's bilateral LE do not appear grossly swollen, but patient did not wish to have shoes/ socks removed for thorough assessment.  Neurological: He is alert and oriented to person, place, and time.  Skin: Skin is warm and dry.  Psychiatric: He has a normal mood and affect. His behavior is normal. Judgment and thought content normal.    Encounter Medications:   Outpatient Encounter Prescriptions as of 10/28/2015  Medication Sig  . acetaminophen (TYLENOL) 325 MG tablet Take 650 mg by mouth every 6 (six) hours as needed for moderate pain.   Marland Kitchen albuterol (PROVENTIL HFA;VENTOLIN HFA) 108 (90 Base) MCG/ACT inhaler Inhale 2 puffs into the lungs every 6 (six) hours as needed for wheezing or shortness of breath.  Marland Kitchen amiodarone (PACERONE) 200 MG tablet Take 1 tablet (200 mg total) by mouth 2 (two) times daily.  Marland Kitchen aspirin EC 81 MG EC tablet Take 1 tablet (81 mg total) by mouth daily.  . Fluticasone Furoate-Vilanterol 100-25 MCG/INH AEPB Inhale 1 puff into the lungs daily.  . furosemide (LASIX) 40 MG tablet Take 40 mg by mouth.  . tiotropium (SPIRIVA HANDIHALER) 18 MCG inhalation capsule Place 1 capsule (18 mcg total) into inhaler and inhale daily.  . traMADol (ULTRAM) 50 MG tablet 1/2 to 1 tablet three times a day as needed for pain.   No facility-administered encounter medications on file as of 10/28/2015.    Functional Status:   In your present state of health, do you have any difficulty performing the following activities: 10/13/2015 09/22/2015  Hearing? Y N  Vision? N N   Difficulty concentrating or making decisions? N N  Walking or climbing stairs? Y Y  Dressing or bathing? N N  Doing errands, shopping? Y N  Preparing Food and eating ? - -  Using the Toilet? - -  In the past six months, have you accidently leaked urine? - -  Do you have problems with loss of bowel control? - -  Managing your Medications? - -  Managing your Finances? - -  Housekeeping or managing your Housekeeping? - -    Fall/Depression Screening:    PHQ 2/9 Scores 10/13/2015 07/28/2015 09/09/2014  PHQ - 2 Score 3 0 0    Assessment:  Mr. Kleckner is frustrated with his current state of health, along with the requirements placed on him to maintain a positive state of health while at home.  Mr. Mijangos has very good support systems through his family members, who are available to assist in his care, but are also frustrated, as Mr. Schifano is often not receptive to receiving the care that is provided/ offered.  Plan:  Mr. Skaggs will continue to take his medications and wear his O2 as prescribed post-discharge from hospital. Mr. Borjon will continue working with [facility] RN and PT services as ordered post-hospital discharge. Mr. Sprung will continue monitoring his daily weights, and will consider starting to record these values.   Mr. Niswander and/ or his family will contact his provider if he gains > 2 lbs overnight or > 5 lbs in one week. Mr. Kretschmer will contact his medical provider team should concerns, problems, or issues arise. I will send this documentation to Mr. Seeley's PCP for care coordination as follow up to today's visit with Mr. Peddie. Continued [facility] CM follow up for transition of care scheduled for ongoing telephone outreach and possible home visit next month.  I appreciate the opportunity to participate in Mr. Jessop's care,  Caryl Pina, RN, BSN, [employer] Coordinator [facility] Care Management  [phone]

## 2015-10-30 ENCOUNTER — Other Ambulatory Visit: Payer: Self-pay

## 2015-10-30 DIAGNOSIS — I110 Hypertensive heart disease with heart failure: Secondary | ICD-10-CM | POA: Diagnosis not present

## 2015-10-30 DIAGNOSIS — J45909 Unspecified asthma, uncomplicated: Secondary | ICD-10-CM | POA: Diagnosis not present

## 2015-10-30 DIAGNOSIS — I5023 Acute on chronic systolic (congestive) heart failure: Secondary | ICD-10-CM | POA: Diagnosis not present

## 2015-10-30 DIAGNOSIS — I480 Paroxysmal atrial fibrillation: Secondary | ICD-10-CM | POA: Diagnosis not present

## 2015-10-30 DIAGNOSIS — J439 Emphysema, unspecified: Secondary | ICD-10-CM | POA: Diagnosis not present

## 2015-10-30 DIAGNOSIS — M1990 Unspecified osteoarthritis, unspecified site: Secondary | ICD-10-CM | POA: Diagnosis not present

## 2015-10-30 MED ORDER — ASPIRIN 81 MG PO TBEC: 81.0000 mg | DELAYED_RELEASE_TABLET | Freq: Every day | ORAL | Status: DC

## 2015-10-30 MED ORDER — AMIODARONE HCL 200 MG PO TABS: 200.0000 mg | ORAL_TABLET | Freq: Two times a day (BID) | ORAL | Status: DC

## 2015-10-30 MED ORDER — FUROSEMIDE 40 MG PO TABS: 40.0000 mg | ORAL_TABLET | Freq: Every day | ORAL | Status: DC

## 2015-10-30 NOTE — Telephone Encounter (Signed)
[name] notified refill done and voiced understanding.

## 2015-10-30 NOTE — Telephone Encounter (Signed)
[name] pts daughter (DPR signed) left v/m requesting refills on amiodarone,ASA and furosemide that was prescribed by hospital physician. Pt seen 10/06/15.Please advise. Dr Alphonsus Sias out of office.

## 2015-10-30 NOTE — Telephone Encounter (Signed)
Dr. Alphonsus Sias last note reviewed, medications sent electronically.

## 2015-11-02 DIAGNOSIS — M1990 Unspecified osteoarthritis, unspecified site: Secondary | ICD-10-CM | POA: Diagnosis not present

## 2015-11-02 DIAGNOSIS — I5023 Acute on chronic systolic (congestive) heart failure: Secondary | ICD-10-CM | POA: Diagnosis not present

## 2015-11-02 DIAGNOSIS — I480 Paroxysmal atrial fibrillation: Secondary | ICD-10-CM | POA: Diagnosis not present

## 2015-11-02 DIAGNOSIS — I110 Hypertensive heart disease with heart failure: Secondary | ICD-10-CM | POA: Diagnosis not present

## 2015-11-02 DIAGNOSIS — J439 Emphysema, unspecified: Secondary | ICD-10-CM | POA: Diagnosis not present

## 2015-11-02 DIAGNOSIS — J45909 Unspecified asthma, uncomplicated: Secondary | ICD-10-CM | POA: Diagnosis not present

## 2015-11-03 ENCOUNTER — Other Ambulatory Visit: Payer: Self-pay | Admitting: *Deleted

## 2015-11-03 NOTE — Patient Outreach (Signed)
[facility] [employer]) Care Management [facility] Community CM Telephone Outreach, Transition of Care Day 31 11/03/2015  [name] [birth year] [id number]  Successful telephone outreach to Kenneth [name] is an 82 y.o. male followed by [facility] CM for transition of care after recent IP hospital visit, April 18-25, 2017 for A-Fib with RVR and CHF management. [name] was discharged with home O2, and is currently being followed by [facility] home health for in-home PT services, as well as DME needs.  Unknown person answered phone call, and gave phone to [name], who reports that he "is doing the same."  [name] states that he feels "pretty good" today, and that "nothing has changed."  Upon questioning, [name] reports that he did not yet weigh himself today, "but my weight has been the same, "about 170 or 180 pounds."  [name] reports that he has been weighing daily, but that his family members (who are currently unavailable) "keep up with it." During our conversation, [name] checked his oxygen via (home) pulse oximetry and reported that it was "94%" with a HR of "68."  [name] reports that he has continued wearing his oxygen "at night when I sleep," "or whenever I need it."    [name] reported that the home health PT visited him today, and "he said I did good, and was walking and moving my legs good."  [name] again verbalized that he values his home health PT services, reporting that he believes, "it really helps me."  [name] reported today that he is planning on attending scheduled office visit with Dr. Graciela Husbands (cardiology provider) this week, and states that he has been taking all of his medications as prescribed.  [name] denies needs, questions, issues or problems today.  Follow up call placed to [name]'s daughter, [name] at [phone], (as family had requested during initial home visit on Oct 28, 2015).  Left HIPAA complaint VM msg, asking Sherrie to return my  call.  Plan: [name] will attend his scheduled cardiology provider office visit this week. [name] will continue to take his medications and wear his O2 as prescribed post-discharge from hospital. [name] will continue working with [facility] PT services as ordered post-hospital discharge. [name] will continue monitoring his daily weights, and will record these values.  [name] and/ or his family will contact his provider if he gains > 2 lbs overnight or > 5 lbs in one week. [name] and/or his family will contact his medical provider team should concerns, problems, or issues arise.  If I do not hear back from [name]'s daughter, [facility] Community CM telephone outreach later this week  Caryl Pina, RN, BSN, [employer] Coordinator [facility] Care Management  [phone]

## 2015-11-04 ENCOUNTER — Other Ambulatory Visit: Payer: Self-pay | Admitting: *Deleted

## 2015-11-04 NOTE — Patient Outreach (Signed)
[facility]) Care Management [facility] Community CM Telephone Outreach 11/04/2015  Max IDRIS RAINONE 1925/08/13 [id number]   Successful telephone outreach to Myles Lipps, daughter and caregiver of LIBORIO CRAYCRAFT, an 82 y.o. male followed by [facility] CM for transition of care after recent IP hospital visit, April 18-25, 2017 for A-Fib with RVR and CHF management. Mr. Stoops was discharged with home O2, and is currently being followed by [facility] home health for in-home PT services, as well as DME needs.  Sherrie had left me a Cabin crew and I returned her call and successfully connected with her for an update on her father.  Today, Sherrie reports that she believes Mr. Gillum has "had a real turn-around" since [facility] Community CM initial home visit last week.  Sherrie reports that Mr. Fortenberry has been taking his medications as prescribed, wearing his O2 appropriately, and working with home health PT as ordered after his discharge home from the hopsital.  Sherrie states, "he has a much more positive attitude than he did before, and is being much more cooperative."  Sherrie reports that Mr. Kozub has voiced plans to "be better," about attending his provider appointments, stating, "he has promised to go to his cardiology appointment this week."  Sherrie denies needs, questions, issues or problems today, and we discussed need for further [facility] CM services.  Sherrie would like to continue [facility] Community CM involvement, and wishes to discuss this with Mr. Thang.  We discussed options for scheduling next [facility] Community in-home visit, and contracted that Sherrie would contact me once she and her father have looked at their schedules, and she is able to get feedback from mr. Perotti about whether he would like to continue participating with [facility] Community CM services.  Plan: Mr. Bassetti will attend his scheduled cardiology provider office visit this week. Mr. Cheever will continue to take his medications and wear his O2  as prescribed post-discharge from hospital. Mr. Jank will continue working with [facility] PT services as ordered post-hospital discharge. Mr. Schulze will continue monitoring his daily weights, and will record these values.  Mr. Yatsko and/ or his family will contact his provider if he gains > 2 lbs overnight or > 5 lbs in one week. Mr. Raudabaugh and/or his family will contact his medical provider team should concerns, problems, or issues arise. Mr. Pas daughter Charlton Amor will contact [facility] Community CM next week to discuss patient's participation in [facility] Community CM program.  Caryl Pina, RN, BSN, CCRN Alumnus [facility] Care Management  [phone]

## 2015-11-05 ENCOUNTER — Encounter: Payer: Self-pay | Admitting: Internal Medicine

## 2015-11-05 ENCOUNTER — Ambulatory Visit (INDEPENDENT_AMBULATORY_CARE_PROVIDER_SITE_OTHER): Payer: Medicare Other | Admitting: Internal Medicine

## 2015-11-05 VITALS — BP 162/80 | HR 76 | Ht 73.0 in | Wt 188.0 lb

## 2015-11-05 DIAGNOSIS — I471 Supraventricular tachycardia: Secondary | ICD-10-CM

## 2015-11-05 DIAGNOSIS — M1990 Unspecified osteoarthritis, unspecified site: Secondary | ICD-10-CM | POA: Diagnosis not present

## 2015-11-05 DIAGNOSIS — I5023 Acute on chronic systolic (congestive) heart failure: Secondary | ICD-10-CM | POA: Diagnosis not present

## 2015-11-05 DIAGNOSIS — I480 Paroxysmal atrial fibrillation: Secondary | ICD-10-CM

## 2015-11-05 DIAGNOSIS — J439 Emphysema, unspecified: Secondary | ICD-10-CM | POA: Diagnosis not present

## 2015-11-05 DIAGNOSIS — J45909 Unspecified asthma, uncomplicated: Secondary | ICD-10-CM | POA: Diagnosis not present

## 2015-11-05 DIAGNOSIS — I110 Hypertensive heart disease with heart failure: Secondary | ICD-10-CM | POA: Diagnosis not present

## 2015-11-05 NOTE — Patient Instructions (Signed)
Medication Instructions: - Your physician recommends that you continue on your current medications as directed. Please refer to the Current Medication list given to you today.  Labwork: - Your physician recommends that you have lab work today: liver/ TSH  Procedures/Testing: - none  Follow-Up: - Your physician recommends that you schedule a follow-up appointment in: 3-4 months with Dr. Graciela Husbands   Any Additional Special Instructions Will Be Listed Below (If Applicable).     If you need a refill on your cardiac medications before your next appointment, please call your pharmacy.

## 2015-11-05 NOTE — Progress Notes (Signed)
Patient Care Team: Karie Schwalbe, MD as PCP - General (Internal Medicine) Duke Salvia, MD as Consulting Physician (Cardiology) Erin Fulling, MD as Consulting Physician (Pulmonary Disease) Michaela Corner, RN as [facility] Care Management Delma Freeze, FNP as Nurse Practitioner (Family Medicine) Antonieta Iba, MD as Consulting Physician (Cardiology)   HPI  [name] is a 82 y.o. male Seen in follow-up for SVT. Included adenosine sensitive tachycardia as well as apparently atrial fibrillation although the only strips available and demonstrated PACs.  He is not complaining tachypalpitations  he was hospitalized in April for congestive heart failure and tachycardia. This was described as atrial fibrillation; this is wrong. He was an AV nodal reentry  He has also had problems with bradycardia and as he has been of averse to going to the hospital we placed him on flecainide at our last visit.  However, he refused to take it as noted below. DATE PR interval QRSduration Dose-  *12**/16 -- 94 0  1/17 -- 94 0     Records and Results Reviewed echocardiogram 2/16 [facility] normal LV function;  Repeat echocardiogram 4/17 demonstrated EF 25-30%;  he was started on amiodarone having previously declined to take flecainide in the context of what had been normal LV function  He suggest that he is not better.  Hospitalization was also notable for agitation prompting initiation Seroquel Chest CT scan evidence of emphysema  Past Medical History  Diagnosis Date  . Arthritis   . Asthma   . Chicken pox   . Diverticulitis   . Emphysema of lung (HCC)   . Frequent headaches   . Allergy   . Hypertension   . Arrhythmia   . History of colon polyps     Past Surgical History  Procedure Laterality Date  . Appendectomy    . Tonsillectomy    . Back surgery  ~2011    Lumbar fusion    Current Outpatient Prescriptions  Medication Sig Dispense Refill  . acetaminophen  (TYLENOL) 325 MG tablet Take 650 mg by mouth every 6 (six) hours as needed for moderate pain.     Marland Kitchen albuterol (PROVENTIL HFA;VENTOLIN HFA) 108 (90 Base) MCG/ACT inhaler Inhale 2 puffs into the lungs every 6 (six) hours as needed for wheezing or shortness of breath. 1 Inhaler 2  . amiodarone (PACERONE) 200 MG tablet Take 1 tablet (200 mg total) by mouth 2 (two) times daily. 60 tablet 0  . aspirin 81 MG EC tablet Take 1 tablet (81 mg total) by mouth daily. 30 tablet 0  . Fluticasone Furoate-Vilanterol 100-25 MCG/INH AEPB Inhale 1 puff into the lungs daily. 60 each 3  . furosemide (LASIX) 40 MG tablet Take 1 tablet (40 mg total) by mouth daily. 30 tablet 0  . tiotropium (SPIRIVA HANDIHALER) 18 MCG inhalation capsule Place 1 capsule (18 mcg total) into inhaler and inhale daily. 30 capsule 3   No current facility-administered medications for this visit.    No Known Allergies    Review of Systems negative except from HPI and PMH  Physical Exam BP 162/80 mmHg  Pulse 76  Ht  (1.854 m)  Wt 188 lb (85.276 kg)  BMI 24.81 kg/m2 Well developed and well nourished in no acute distress HENT normal E scleral and icterus clear Neck Supple JVP<,10 carotids brisk and full Regular rate and rhythm without murmurs Soft with active bowel sounds No clubbing cyanosis no Edema Alert and oriented times 07/25/1985 Pres.  Trump, grossly normal motor and sensory function agitated. Skin Warm and Dry  ECG sinus 76 Intervals 21/10/41 Axis left -31    Assessment and  Plan  SVT-incessant  Sinus bradycardia  COPD/emphysema  Dyspnea on exertion  High risk medication surveillance  NICM  CHF chronic systolic     The patient is euvolemic. We will continue him on his diuretics. I should note that he denies that he is taking on but that his daughter is making sure that he gets anyway in addition, he has been on amiodarone and at least at this occasion and on the last records in the vital sign report  his heart rate has been controlled implying effectiveness of the amiodarone; we will decrease the dose. We will check medications  Notably there is no atrial fibrillation. There is no need for anticoagulation  He will followup with CHF clinic

## 2015-11-06 ENCOUNTER — Telehealth: Payer: Self-pay

## 2015-11-06 DIAGNOSIS — J439 Emphysema, unspecified: Secondary | ICD-10-CM | POA: Diagnosis not present

## 2015-11-06 DIAGNOSIS — I5023 Acute on chronic systolic (congestive) heart failure: Secondary | ICD-10-CM | POA: Diagnosis not present

## 2015-11-06 DIAGNOSIS — I110 Hypertensive heart disease with heart failure: Secondary | ICD-10-CM | POA: Diagnosis not present

## 2015-11-06 DIAGNOSIS — J45909 Unspecified asthma, uncomplicated: Secondary | ICD-10-CM | POA: Diagnosis not present

## 2015-11-06 DIAGNOSIS — M1990 Unspecified osteoarthritis, unspecified site: Secondary | ICD-10-CM | POA: Diagnosis not present

## 2015-11-06 DIAGNOSIS — I480 Paroxysmal atrial fibrillation: Secondary | ICD-10-CM | POA: Diagnosis not present

## 2015-11-06 LAB — SPECIMEN STATUS

## 2015-11-06 NOTE — Telephone Encounter (Signed)
I do not think a muscle relaxer is a great idea----and I am still awaiting him actually coming in for the requested follow up

## 2015-11-06 NOTE — Telephone Encounter (Signed)
Spoke to [employer]. He agrees that the patient needs to be seen to go over his medical issues. He will let the pt know.

## 2015-11-06 NOTE — Telephone Encounter (Signed)
Mr Kenneth Citrin RN with [facility] left v/m; pt has been complaining with severe back pain and Tylenol is not helping pain. Mr [name] wants to know if Dr Alphonsus Sias would consider Skelaxin for muscle spasms. [facility] [address] Mr [name] request cb.

## 2015-11-11 ENCOUNTER — Other Ambulatory Visit: Payer: Self-pay | Admitting: *Deleted

## 2015-11-11 DIAGNOSIS — I5041 Acute combined systolic (congestive) and diastolic (congestive) heart failure: Secondary | ICD-10-CM

## 2015-11-11 NOTE — Patient Outreach (Signed)
[facility]) Care Management [facility] Community CM Telephone Outreach 11/11/2015   Max LIAN TANORI 1925-12-27 [id number]  Successful telephone outreach with Myles Lipps, daughter of Max Lambertson; returned call to [facility], who had left me a voice mail message asking me to return her call.  Mr. Geathers is an 82 y.o. male followed by [facility] Community CM for transition of care after recent IP hospital visit, April 18-25, 2017 for A-Fib with RVR and CHF management. Mr. Minion was discharged with home O2, and is currently being followed by [facility] home health for in-home RN/ PT services, as well as DME needs.Mr. Daman successfully completed transition of care without a readmission to the hospital, and is now followed by [facility] Community CM for self-health management of chronic disease state of CHF.   Today, I had a 50 minute phone conversation with Sherrie, who confirmed that she would like to continue [facility] Community CM involvement in the care of her father; we scheduled a home visit for later this month.  Sherrie expressed several concerns to me today about her father's progress.  Sherrie stated that both her mother and father have medical issues and are easily confused around the numerous caregivers who are coming in to their home to assist with care.  Sherrie states that the family has hired sitters through a private company to assist with basic support when [employer] can not be there due to her job schedule.  Sherrie states that Mr. Luppino has not been open to having so many caregivers present in his home, and both Mr. And Mrs. Zimmers have been confused to the various roles of different caregivers.    Sherrie stated that one of the home health nurses came to visit Mr. Derryl Harbor on November 06, 2015, and "everything changed after that visit."  Sherrie stated that prior to the visit by the "male nurse," the family had a very effective system around giving Mr. Walrond his medications everyday, which Mr. Huaracha was 100% compliant  with.  Sherrie stated that "after the male nurse came that day," Mr. Strassman refused to take some of his medications, specifically, the ASA and lasix that he previously had been taking as prescribed.  Sherrie stated that after the visit with nurse, the family "could not find the bottles" for the ASA and the lasix.  When Sherrie confronted Mr. and Mrs. Milewski and tried to locate the pill bottles to fill the pill box, Mr. Stillinger became angry and told her that he "could manage his own medicine and didn't need" Sherrie's help. Sherrie reported that they eventually did find the pill bottles, but that Mr. Miron continues to refuse her involvement/ assistance with his adherence and administration of the medication.  Sherrie stated that Mr. Odonoghue told her "nobody can tell me what I have to take or don't take."  Sherrie stated that currently,  "since that visit with the home health nurse" she can not verify whether or not Mr. Eichelberger is taking his medications as they are prescribed, stating, that Mr. Holloway "will not allow me to have anything to do with his medications."  Sherrie also expressed concern that she "never knows when the home health nurse is going to come to visit," stating that is "a huge problem," because she feels that she should be present during home visits with Mr. Bowers caregivers since neither Mr. or Mrs. Mangan can accurately report on the details or purpose of the visit or the outcome of the visits.  Sherrie would like for  the visits to be pre-scheduled so that she can arrange her work schedule accordingly and be present when the visits occur.  I had a long discussion with Sherie about the role and purpose of home health vs. care management services.  I provided Sherrie the phone number for [facility], and advised that she should speak to a supervisor about her concerns with the last home health visit, as well as with the scheduling of their visits to Mr. Slabaugh.  Sherrie also stated that she would  like [facility] Community CM assistance with "knowing what options are available" for possible future placement of her parents in an Assisted Living community, stating, "I would like to know how to navigate that process before we actually need to rush to find an appropriate place for them in the future."  I explained that [facility] CM had CSW providers who could assist her in understanding her options and the process she should take in exploring these options.  Sherrie confirmed that she would like to talk with a [facility] CSW by phone to get more information.  [facility] CSW referral made.   Sherrie reported that Mr. Derryl Harbor did attend his scheduled provider appointment with Dr. Graciela Husbands, which "went well," stating that she was present during the office visit.  Sherrie states that she and Mrs. Payant "are still trying" to get Mr. Belen to visit his PCP after he refused to go to the last scheduled appointment.  Sherrie reports that Mr. Bernhagen otherwise "seems to be doing okay."  She denies further concerns, questions, issues, or problems.  I let Sherrie know my general schedule for the upcoming weeks prior to our next scheduled home visit, and made sure that she had the numbers for the 24-hour nurse line, as well as the main [facility] CM phone number should she need them.  Plan:  Mr. Hibbert daughter Charlton Amor will contact [facility] about her concerns with the scheduling of their home visits and the last home visit that was made by [facility] RN.   Mr. Mahan and/or his family will contact his medical provider team should concerns, problems, or issues arise.  Mr. Milic daughter Charlton Amor will contact [facility] Community CM as needed for assistance in navigating Mr. Yousuf's care.  [facility] Community RN CM will make [facility] CSW referral.  [facility] Community CM in-home visit scheduled for later this month.  Caryl Pina, RN, BSN, [employer] [facility] Care Management  [phone]

## 2015-11-13 ENCOUNTER — Encounter: Payer: Self-pay | Admitting: Family

## 2015-11-13 ENCOUNTER — Ambulatory Visit: Payer: Medicare Other | Attending: Family | Admitting: Family

## 2015-11-13 VITALS — BP 123/76 | HR 68 | Resp 22 | Ht 73.0 in | Wt 193.0 lb

## 2015-11-13 DIAGNOSIS — Z8619 Personal history of other infectious and parasitic diseases: Secondary | ICD-10-CM | POA: Insufficient documentation

## 2015-11-13 DIAGNOSIS — M1990 Unspecified osteoarthritis, unspecified site: Secondary | ICD-10-CM | POA: Diagnosis not present

## 2015-11-13 DIAGNOSIS — I5022 Chronic systolic (congestive) heart failure: Secondary | ICD-10-CM

## 2015-11-13 DIAGNOSIS — K5792 Diverticulitis of intestine, part unspecified, without perforation or abscess without bleeding: Secondary | ICD-10-CM | POA: Diagnosis not present

## 2015-11-13 DIAGNOSIS — M7989 Other specified soft tissue disorders: Secondary | ICD-10-CM | POA: Insufficient documentation

## 2015-11-13 DIAGNOSIS — J439 Emphysema, unspecified: Secondary | ICD-10-CM | POA: Diagnosis not present

## 2015-11-13 DIAGNOSIS — Z8249 Family history of ischemic heart disease and other diseases of the circulatory system: Secondary | ICD-10-CM | POA: Insufficient documentation

## 2015-11-13 DIAGNOSIS — I110 Hypertensive heart disease with heart failure: Secondary | ICD-10-CM | POA: Diagnosis not present

## 2015-11-13 DIAGNOSIS — Z801 Family history of malignant neoplasm of trachea, bronchus and lung: Secondary | ICD-10-CM | POA: Insufficient documentation

## 2015-11-13 DIAGNOSIS — R51 Headache: Secondary | ICD-10-CM | POA: Insufficient documentation

## 2015-11-13 DIAGNOSIS — Z86010 Personal history of colonic polyps: Secondary | ICD-10-CM | POA: Diagnosis not present

## 2015-11-13 DIAGNOSIS — R0602 Shortness of breath: Secondary | ICD-10-CM | POA: Diagnosis not present

## 2015-11-13 DIAGNOSIS — J449 Chronic obstructive pulmonary disease, unspecified: Secondary | ICD-10-CM | POA: Insufficient documentation

## 2015-11-13 DIAGNOSIS — Z87891 Personal history of nicotine dependence: Secondary | ICD-10-CM | POA: Insufficient documentation

## 2015-11-13 DIAGNOSIS — R600 Localized edema: Secondary | ICD-10-CM | POA: Diagnosis not present

## 2015-11-13 DIAGNOSIS — Z811 Family history of alcohol abuse and dependence: Secondary | ICD-10-CM | POA: Insufficient documentation

## 2015-11-13 DIAGNOSIS — Z8261 Family history of arthritis: Secondary | ICD-10-CM | POA: Insufficient documentation

## 2015-11-13 DIAGNOSIS — I4891 Unspecified atrial fibrillation: Secondary | ICD-10-CM | POA: Insufficient documentation

## 2015-11-13 DIAGNOSIS — J438 Other emphysema: Secondary | ICD-10-CM

## 2015-11-13 DIAGNOSIS — I480 Paroxysmal atrial fibrillation: Secondary | ICD-10-CM

## 2015-11-13 NOTE — Progress Notes (Signed)
Subjective:    Patient ID: [name], male    DOB: [birth year], 82 y.o.   MRN: [id number]  Congestive Heart Failure Presents for follow-up visit. The disease course has been stable. Associated symptoms include edema, fatigue and shortness of breath. Pertinent negatives include no abdominal pain, chest pain, chest pressure, orthopnea or palpitations. The symptoms have been stable. Past treatments include salt and fluid restriction. The treatment provided mild relief. Compliance with prior treatments has been variable. Prior compliance problems include difficulty understanding directions. His past medical history is significant for chronic lung disease and HTN. There is no history of CVA or DM.  Atrial Fibrillation Presents for follow-up visit. Symptoms include shortness of breath. Symptoms are negative for bradycardia, chest pain, dizziness, hypotension and palpitations. The symptoms have been stable. Past treatments include antiarrhythmics. Compliance with prior treatments has been variable. Prior compliance problems include difficulty understanding directions. Past medical history includes atrial fibrillation, CHF and HTN. Medication compliance problems include psychosocial issues.   Past Medical History  Diagnosis Date  . Arthritis   . Asthma   . Chicken pox   . Diverticulitis   . Emphysema of lung (HCC)   . Frequent headaches   . Allergy   . Hypertension   . Arrhythmia   . History of colon polyps     Past Surgical History  Procedure Laterality Date  . Appendectomy    . Tonsillectomy    . Back surgery  ~2011    Lumbar fusion    Family History  Problem Relation Age of Onset  . Arthritis Mother   . Hypertension Mother   . Hyperlipidemia Father   . Hypertension Father   . Alcohol abuse Brother   . Cancer Brother     Lung    Social History  Substance Use Topics  . Smoking status: Former Smoker -- 0.25 packs/day for 60 years    Types: Cigarettes    Quit date: 08/05/2014   . Smokeless tobacco: Former Neurosurgeon     Comment: quit x 1 month  . Alcohol Use: No    No Known Allergies  Prior to Admission medications   Medication Sig Start Date End Date Taking? Authorizing Provider  acetaminophen (TYLENOL) 325 MG tablet Take 650 mg by mouth every 6 (six) hours as needed for moderate pain.    Yes Historical Provider, MD  albuterol (PROVENTIL HFA;VENTOLIN HFA) 108 (90 Base) MCG/ACT inhaler Inhale 2 puffs into the lungs every 6 (six) hours as needed for wheezing or shortness of breath. 08/04/15  Yes Karie Schwalbe, MD  amiodarone (PACERONE) 200 MG tablet Take 1 tablet (200 mg total) by mouth 2 (two) times daily. 10/30/15  Yes Lorre Munroe, NP  Fluticasone Furoate-Vilanterol 100-25 MCG/INH AEPB Inhale 1 puff into the lungs daily. 07/17/15  Yes Karie Schwalbe, MD  tiotropium (SPIRIVA HANDIHALER) 18 MCG inhalation capsule Place 1 capsule (18 mcg total) into inhaler and inhale daily. 08/04/15  Yes Karie Schwalbe, MD      Review of Systems  Constitutional: Positive for fatigue. Negative for appetite change.  HENT: Positive for rhinorrhea. Negative for congestion and sore throat.   Eyes: Negative.   Respiratory: Positive for shortness of breath. Negative for cough, chest tightness and wheezing.   Cardiovascular: Positive for leg swelling. Negative for chest pain and palpitations.  Gastrointestinal: Negative for abdominal pain and abdominal distention.  Endocrine: Negative.   Genitourinary: Negative.   Musculoskeletal: Positive for back pain. Negative for neck pain.  Skin:  Negative.   Allergic/Immunologic: Negative.   Neurological: Positive for headaches. Negative for dizziness and light-headedness.  Hematological: Negative for adenopathy. Does not bruise/bleed easily.  Psychiatric/Behavioral: Positive for sleep disturbance (sleeping on 1 pillow. waking up often to urinate.) and agitation. Negative for dysphoric mood. The patient is nervous/anxious.         Objective:   Physical Exam  Constitutional: He is oriented to person, place, and time. He appears well-developed and well-nourished.  HENT:  Head: Normocephalic and atraumatic.  Eyes: Conjunctivae are normal. Pupils are equal, round, and reactive to light.  Neck: Normal range of motion. Neck supple.  Cardiovascular: Normal rate and regular rhythm.   Pulmonary/Chest: Effort normal. He has no wheezes. He has no rales.  Abdominal: Soft. He exhibits no distension. There is no tenderness.  Musculoskeletal: He exhibits edema (1+ pitting edema in bilateral lower legs with R>L). He exhibits no tenderness.  Neurological: He is alert and oriented to person, place, and time.  Skin: Skin is warm and dry.  Psychiatric: He has a normal mood and affect. He is agitated.  Nursing note and vitals reviewed.  BP 123/76 mmHg  Pulse 68  Resp 22  Ht 6\' 1"  (1.854 m)  Wt 193 lb (87.544 kg)  BMI 25.47 kg/m2  SpO2 95%        Assessment & Plan:  1: Chronic heart failure with reduced ejection fraction- Patient presents with fatigue and shortness of breath upon exertion. He says that he was quite tired upon walking into the office today but once he sat down for a few minutes, he felt better. He continues to weigh himself and says that his home weight stays close to 182 pounds. By our scale, he's gained 4 pounds since the last time he was here on 10/13/15. Reminded to call for an overnight weight gain of >2 pounds or a weekly weight gain of >5 pounds. He is not adding salt to his food and is trying to eat low sodium foods. He does have swelling in his legs and he initially said that he wasn't taking furosemide anymore and his daughter says that it's in a drawer. When trying to clarify whether he's actually taking it or not, he says that he really doesn't know because there's something on "his sheet" that was circled that he doesn't take. Encouraged he and his wife to take our list (based on what he initially said) and  compare it to what he's actually taking and to call us if there's any discrepancy. Patient was getting more irritated/agitated the more he was questioned about his medications as he kept saying "I know what I"m taking".  2: Atrial fibrillation- Currently rate controlled with amiodarone. He says that he's no longer taking aspirin. Has recently seen his cardiologist.  3: COPD- Says that he's continuing to use his inhalers. Does have the oxygen equipment at home but rarely uses it anymore.   Patient did not bring his medications nor a list. Each medication was verbally reviewed with the patient and he was encouraged to bring the bottles to every visit to confirm accuracy of list.  Return in 3 months or sooner for any questions/problems before then.

## 2015-11-13 NOTE — Patient Instructions (Signed)
Continue weighing daily and call for an overnight weight gain of > 2 pounds or a weekly weight gain of >5 pounds. 

## 2015-11-16 ENCOUNTER — Other Ambulatory Visit: Payer: Self-pay | Admitting: *Deleted

## 2015-11-16 NOTE — Patient Outreach (Addendum)
[facility]) Care Management  11/16/2015  Max PRENTICE OHMAN [birth year] [id number]  Phone call to patient's daughter Clent Lafon to discuss placement options for patient.  Voicemail message left requesting a return call.  This Child psychotherapist will await a return call.      [name] [facility] Care Management [phone]

## 2015-11-18 ENCOUNTER — Other Ambulatory Visit: Payer: Self-pay | Admitting: *Deleted

## 2015-11-18 DIAGNOSIS — I5023 Acute on chronic systolic (congestive) heart failure: Secondary | ICD-10-CM | POA: Diagnosis not present

## 2015-11-18 DIAGNOSIS — J439 Emphysema, unspecified: Secondary | ICD-10-CM | POA: Diagnosis not present

## 2015-11-18 DIAGNOSIS — I480 Paroxysmal atrial fibrillation: Secondary | ICD-10-CM | POA: Diagnosis not present

## 2015-11-18 DIAGNOSIS — J45909 Unspecified asthma, uncomplicated: Secondary | ICD-10-CM | POA: Diagnosis not present

## 2015-11-18 DIAGNOSIS — M1990 Unspecified osteoarthritis, unspecified site: Secondary | ICD-10-CM | POA: Diagnosis not present

## 2015-11-18 DIAGNOSIS — I110 Hypertensive heart disease with heart failure: Secondary | ICD-10-CM | POA: Diagnosis not present

## 2015-11-18 NOTE — Patient Outreach (Signed)
[facility]) Care Management  11/18/2015  [name] 13-Jun-1925 [id number]   Return phone call from patient's daughter on 11/17/15 requesting information on retirement facilities for patient.  She explained that patient currently receives personal care services from [facility].  Patient's spouse has memory issues as well.  Patient's daughter, requested a phone call on 11/19/15 at approximately 10 am to speak further.  Plan:  Phone call to speak to daughter regarding retirement facilities scheduled for 11/19/15 at 10am.    [name] [facility] Care Management [phone]

## 2015-11-19 ENCOUNTER — Other Ambulatory Visit: Payer: Self-pay | Admitting: *Deleted

## 2015-11-19 ENCOUNTER — Encounter: Payer: Self-pay | Admitting: *Deleted

## 2015-11-19 NOTE — Patient Outreach (Signed)
[facility]) Care Management  11/19/2015  [name] 09-24-1925 [id number]   Phone call to patient's daughter who is on the consent, [name]. Patient's daughter requested information to assist with managing patient and patient's spouse at home. Per patient's daughter she has seen a shift in patient's compliance since the home health visit when his medications were reviewed.  He now does not want anyone to review them.  Per patient's daughter, she will discuss her concerns with the home health agency.  Patient's spouse has been diagnosed with Dementia.  At this time, patient and spouse receive 24 hour in home care through [facility].  She explained that her mother is happy with the care she receives as it serves as a social connection for her as well as provides assistance with her household needs. Patient, however has less tolerance for them and feels that he does not need them in the home.  Patient's daughter is considering decreasing the hours to just 12 hours a day but understands the need for supervised care as she cannot provide full time care due to her work schedule.   Patient's daughter requested community resources for caregiver support as they begin to plan for the possibility of out of home care.  This Child psychotherapist provided patient with contact information for [employer], schedule of support groups provided through this agency provided as well as adult day care and respite care.  Phone number also provided for Zerita Boers, dementia support specialist through [facility] who provides counseling, support  And referrals for caregivers dealing with a family member with Dementia.  Contact information also provided for [facility] for caregiver resources.  [facility] also discussed as a resource for senior care advising and consulting. The PACE program discussed as a possible option as well.   Patient's daughter  appreciative of the resources provided and verbalized no other social work needs at this time.  Patient's daughter encouraged to call this social worker back with any questions or concerns.   [name] [facility] Care Management [phone]    [name] [facility] Care Management [phone]

## 2015-11-20 NOTE — Progress Notes (Signed)
This encounter was created in error - please disregard.

## 2015-11-25 ENCOUNTER — Other Ambulatory Visit: Payer: Self-pay | Admitting: Internal Medicine

## 2015-11-25 NOTE — Telephone Encounter (Signed)
This should really be done by the cardiologist

## 2015-11-25 NOTE — Telephone Encounter (Signed)
Last filled 10/30/2015 by Regina---per Cardiology Dr Graciela Husbands OV note from 11/05/2015  "he has been on amiodarone and at least at this occasion and on the last records in the vital sign report his heart rate has been controlled implying effectiveness of the amiodarone; we will decrease the dose."  Please advise if okay to refill

## 2015-11-26 ENCOUNTER — Telehealth: Payer: Self-pay

## 2015-11-26 DIAGNOSIS — I480 Paroxysmal atrial fibrillation: Secondary | ICD-10-CM | POA: Diagnosis not present

## 2015-11-26 DIAGNOSIS — M1990 Unspecified osteoarthritis, unspecified site: Secondary | ICD-10-CM | POA: Diagnosis not present

## 2015-11-26 DIAGNOSIS — J439 Emphysema, unspecified: Secondary | ICD-10-CM | POA: Diagnosis not present

## 2015-11-26 DIAGNOSIS — J45909 Unspecified asthma, uncomplicated: Secondary | ICD-10-CM | POA: Diagnosis not present

## 2015-11-26 DIAGNOSIS — I5023 Acute on chronic systolic (congestive) heart failure: Secondary | ICD-10-CM | POA: Diagnosis not present

## 2015-11-26 DIAGNOSIS — I110 Hypertensive heart disease with heart failure: Secondary | ICD-10-CM | POA: Diagnosis not present

## 2015-11-26 NOTE — Telephone Encounter (Signed)
[name] left v/m; pt is at home and pt has home care; there is someone with pt during daylight hours.[name] request cb wanting to know when to cancel home health care and when does Dr Alphonsus Sias want to see pt again.

## 2015-11-26 NOTE — Telephone Encounter (Signed)
Spoke to [place]. We made him an appointment 12-07-15 to discuss Home Health

## 2015-11-26 NOTE — Telephone Encounter (Signed)
I want to see him soon, before I can give any recommendation about stopping home care

## 2015-11-27 ENCOUNTER — Telehealth: Payer: Self-pay

## 2015-11-27 ENCOUNTER — Other Ambulatory Visit: Payer: Self-pay

## 2015-11-27 ENCOUNTER — Other Ambulatory Visit: Payer: Self-pay | Admitting: Internal Medicine

## 2015-11-27 MED ORDER — AMIODARONE HCL 200 MG PO TABS: 200.0000 mg | ORAL_TABLET | Freq: Two times a day (BID) | ORAL | Status: DC

## 2015-11-27 NOTE — Telephone Encounter (Signed)
Mr Donalee Citrin RN with [facility] left v/m requesting verbal order for home health recertification and continue to see pt for 60 more days due to hypertensive heart disease with CHF. Needs to continue teaching to pt.

## 2015-11-28 NOTE — Telephone Encounter (Signed)
That is appropriate 

## 2015-11-30 DIAGNOSIS — J45909 Unspecified asthma, uncomplicated: Secondary | ICD-10-CM | POA: Diagnosis not present

## 2015-11-30 DIAGNOSIS — J439 Emphysema, unspecified: Secondary | ICD-10-CM | POA: Diagnosis not present

## 2015-11-30 DIAGNOSIS — Z87891 Personal history of nicotine dependence: Secondary | ICD-10-CM | POA: Diagnosis not present

## 2015-11-30 DIAGNOSIS — I480 Paroxysmal atrial fibrillation: Secondary | ICD-10-CM | POA: Diagnosis not present

## 2015-11-30 DIAGNOSIS — Z9981 Dependence on supplemental oxygen: Secondary | ICD-10-CM | POA: Diagnosis not present

## 2015-11-30 DIAGNOSIS — I110 Hypertensive heart disease with heart failure: Secondary | ICD-10-CM | POA: Diagnosis not present

## 2015-11-30 DIAGNOSIS — Z7982 Long term (current) use of aspirin: Secondary | ICD-10-CM | POA: Diagnosis not present

## 2015-11-30 DIAGNOSIS — M1990 Unspecified osteoarthritis, unspecified site: Secondary | ICD-10-CM | POA: Diagnosis not present

## 2015-11-30 DIAGNOSIS — I5023 Acute on chronic systolic (congestive) heart failure: Secondary | ICD-10-CM | POA: Diagnosis not present

## 2015-11-30 NOTE — Telephone Encounter (Signed)
Left message on Lorn Becker's voice mail with verbal authorization to continue [facility] for now.

## 2015-12-03 ENCOUNTER — Encounter: Payer: Self-pay | Admitting: *Deleted

## 2015-12-03 ENCOUNTER — Other Ambulatory Visit: Payer: Self-pay | Admitting: *Deleted

## 2015-12-03 NOTE — Patient Outreach (Signed)
[facility]) Care Management  [facility] CM Routine Home Visit 12/03/2015  [name] Dec 20, 1925 [id number]  [name] is an 82 y.o. male followed by [facility] CM for transition of care after recent IP hospital visit, April 18-25, 2017 for A-Fib with RVR and CHF management.  [name] was discharged with home O2, and is currently being followed by [facility] home health for in-home RN/ PT services, as well as DME needs. [name] successfully completed transition of care without a readmission to the hospital, and is now followed by [facility] CM for self-health management of chronic disease state of CHF.   Today, [name] states that he "is doing good," except that he did not sleep well last night.  [name] states that this is not his norm, and that he "usually sleeps fine."  [name] reports that he has been taking his medications as prescribed and weighing daily.  [name] again states that he "does not need" to record his daily weights, stating, "my weight doesn't change; it is always between 182 and 185 pounds."  [name] is able to accurately describe steps he would take for weight gain greater than 5 pounds in one week, stating that he "would call the doctor."  [name] states that he has not been using his home oxygen much, stating that he has "improved so much I don't need it as much as I used to."  [name] does state that he uses the home O2 "when needed" around episodes of shortness of breath, which he says usually occurs when he "has walked or talked too much."  [name] reports that he checks his SaO2 levels "regularly throughout the day, every day," and reports that he gets consistently gets values between 93-96% on room air.  [name] reports that home health [facility]) services have continued, again stating that he believes that [facility] PT is especially helpful to him. Patient reports that he continues to use a walker to ambulate 100% of the time, and he reports no  falls today.  [name] states that he has attended his recent provider appointments, and together we reviewed his upcoming scheduled appointments.  [name] did not have his scheduled PCP visit for December 07, 2015, on the AVS he shared with me, and stated that he did not think he should attend that appointment, because it was not on the AVS he had with him, which was printed during his last OV in May 2017.  I encouraged [name] to attend the appointment, explaining that his providers wish to stay in close contact with him to make sure he has what he needs and stays as healthy as possible and out of the hospital.  I also reminded [name] of the complaint he shared with me that he thought he had "too much wax" in his (R) ear, and explained that his PCP could evaluate this concern if he attended the appointment. (see ROS).  Eventually, [name] agreed to go to the scheduled OV appointment on Monday December 07, 2015.  During our time together today, [name] stated that he was doing so well that he did not see a need for "all these people coming to check on me everyday."  I explained the overall purpose of ongoing HH, private duty sitters, and [facility] CM services for regular health maintenance and safety.  [name] was receptive to [facility] RN CM making another home visit, and we  scheduled a visit today for next month.     I also discussed overall plan of care with [name] wife and daughter during [facility] CM home visit today.  Subjective: "I think I am doing much better since I was released from the hospital, and I'm not sure I need all these people coming here to my house to check on me.... I think I am fine."  Objective:    BP 116/60 mmHg  Pulse 74  Resp 16  Wt 182 lb (82.555 kg)  SpO2 95%   Review of Systems  Constitutional: Positive for malaise/fatigue. Negative for fever, chills and weight loss.       Pt. reports being "tired" intermittenly; states didn't sleep well last  night; states this is not a regular issue for him.  HENT: Positive for ear pain and hearing loss.        Patient reports decreased hearing in (R) ear x 2-3 weeks, states, "I think I have too much wax in my ear."  Verbalizes plan to discuss with PCP on Monday December 07, 2015 during regularly scheduled office visit.  Respiratory: Positive for shortness of breath. Negative for cough, sputum production and wheezing.        Baseline shortness of breath; patient reports that he recovers within "a few minutes" when he rests upon feeling SOB  Cardiovascular: Positive for leg swelling. Negative for chest pain and palpitations.       +1 bilateral ankle edema noted; improved from last [facility] CM home visit  Gastrointestinal: Negative for nausea, abdominal pain and constipation.  Genitourinary: Negative for urgency and frequency.  Musculoskeletal: Negative for myalgias, joint pain and falls.  Neurological: Negative.  Negative for weakness.  Psychiatric/Behavioral: Negative.  Negative for depression. The patient is not nervous/anxious.     Physical Exam  Constitutional: He is oriented to person, place, and time. He appears well-developed and well-nourished.  HENT:  Right Ear: External ear normal.  See patient complaint of decreased hearing above in ROS  Cardiovascular: Normal rate, regular rhythm, normal heart sounds and intact distal pulses.   Respiratory: Effort normal. No respiratory distress. He has no wheezes. He has no rales.  Patient bilateral breath sounds decreased throughout upper/lower A/L/P lung fields; otherwise clear  GI: Soft. Bowel sounds are normal.  Musculoskeletal: He exhibits edema.  See ROS above  Neurological: He is alert and oriented to person, place, and time.  Skin: Skin is warm and dry.  Psychiatric: He has a normal mood and affect. His behavior is normal. Judgment and thought content normal.    Encounter Medications:   Outpatient Encounter Prescriptions as of 12/03/2015   Medication Sig  . acetaminophen (TYLENOL) 325 MG tablet Take 650 mg by mouth every 6 (six) hours as needed for moderate pain.   Marland Kitchen albuterol (PROVENTIL HFA;VENTOLIN HFA) 108 (90 Base) MCG/ACT inhaler Inhale 2 puffs into the lungs every 6 (six) hours as needed for wheezing or shortness of breath.  Marland Kitchen amiodarone (PACERONE) 200 MG tablet Take 1 tablet (200 mg total) by mouth 2 (two) times daily.  . Fluticasone Furoate-Vilanterol 100-25 MCG/INH AEPB Inhale 1 puff into the lungs daily.  Marland Kitchen tiotropium (SPIRIVA HANDIHALER) 18 MCG inhalation capsule Place 1 capsule (18 mcg total) into inhaler and inhale daily.   No facility-administered encounter medications on file as of 12/03/2015.    Functional Status:   In your present state of health, do you have any difficulty performing the following activities: 11/13/2015 10/28/2015  Hearing? Malvin Johns  Vision? N N  Difficulty concentrating or making decisions? N N  Walking or climbing stairs? Y Y  Dressing or bathing? Y N  Doing errands, shopping? Malvin Johns  Preparing Food and eating ? - Y  Using the Toilet? - N  In the past six months, have you accidently leaked urine? - N  Do you have problems with loss of bowel control? - N  Managing your Medications? - Y  Managing your Finances? - N  Housekeeping or managing your Housekeeping? - N    Fall/Depression Screening:    PHQ 2/9 Scores 11/13/2015 10/28/2015 10/13/2015 07/28/2015 09/09/2014  PHQ - 2 Score 0 1 3 0 0    Assessment:  Mr. Old is feeling better since his recent hospitalization, and has made good progress in his recuperation.  Although he remains frustrated with his current state of health, along with the requirements placed on him to maintain a positive state of health while at home, he is more accepting of his current health situation from an overall standpoint.  Mr. Empson has very good support systems through his family members, and has agreed today to continue with his established plan of care, including  attending provider appointments and [facility] services.  Plan:   Mr. Kaps will continue to take his prescribed medications and wear his O2 as needed.  Mr. Bourbeau will continue working with [facility] RN and PT services as ordered. . Mr. Derner will continue monitoring his daily weights and pulse oximetry readings.  Mr. Najarian and/ or his family will contact his provider if he gains > 2 lbs overnight or > 5 lbs in one week.  Mr. Kitchings will contact his medical provider team should concerns, problems, or issues arise, and will attend scheduled provider appointments.  Continued [facility] CM follow up for self management of chronic disease state of CHF, with home visit scheduled for next month.  Caryl Pina, RN, BSN, [facility] Care Management  [phone]

## 2015-12-07 ENCOUNTER — Other Ambulatory Visit: Payer: Self-pay

## 2015-12-07 ENCOUNTER — Ambulatory Visit (INDEPENDENT_AMBULATORY_CARE_PROVIDER_SITE_OTHER): Payer: Medicare Other | Admitting: Internal Medicine

## 2015-12-07 ENCOUNTER — Encounter: Payer: Self-pay | Admitting: Internal Medicine

## 2015-12-07 VITALS — BP 112/70 | HR 81 | Temp 97.5°F

## 2015-12-07 DIAGNOSIS — J432 Centrilobular emphysema: Secondary | ICD-10-CM

## 2015-12-07 DIAGNOSIS — I480 Paroxysmal atrial fibrillation: Secondary | ICD-10-CM | POA: Diagnosis not present

## 2015-12-07 DIAGNOSIS — I5022 Chronic systolic (congestive) heart failure: Secondary | ICD-10-CM

## 2015-12-07 DIAGNOSIS — J449 Chronic obstructive pulmonary disease, unspecified: Secondary | ICD-10-CM

## 2015-12-07 LAB — COMPREHENSIVE METABOLIC PANEL
ALBUMIN: 3.5 g/dL (ref 3.5–5.2)
ALK PHOS: 86 U/L (ref 39–117)
ALT: 10 U/L (ref 0–53)
AST: 17 U/L (ref 0–37)
BUN: 18 mg/dL (ref 6–23)
CALCIUM: 9 mg/dL (ref 8.4–10.5)
CHLORIDE: 106 meq/L (ref 96–112)
CO2: 24 mEq/L (ref 19–32)
Creatinine, Ser: 1.12 mg/dL (ref 0.40–1.50)
GFR: 65.76 mL/min (ref >60.00)
Glucose, Bld: 96 mg/dL (ref 70–99)
POTASSIUM: 4.2 meq/L (ref 3.5–5.1)
Sodium: 134 mEq/L — ABNORMAL LOW (ref 135–145)
TOTAL PROTEIN: 7.1 g/dL (ref 6.0–8.3)
Total Bilirubin: 0.9 mg/dL (ref 0.2–1.2)

## 2015-12-07 LAB — CBC WITH DIFFERENTIAL/PLATELET
BASOS PCT: 0.5 % (ref 0.0–3.0)
Basophils Absolute: 0.1 10*3/uL (ref 0.0–0.1)
EOS PCT: 1 % (ref 0.0–5.0)
Eosinophils Absolute: 0.1 10*3/uL (ref 0.0–0.7)
HEMATOCRIT: 41.8 % (ref 39.0–52.0)
HEMOGLOBIN: 14.1 g/dL (ref 13.0–17.0)
LYMPHS PCT: 11.9 % — AB (ref 12.0–46.0)
Lymphs Abs: 1.2 10*3/uL (ref 0.7–4.0)
MCHC: 33.7 g/dL (ref 30.0–36.0)
MCV: 92 fl (ref 78.0–100.0)
MONO ABS: 1.2 10*3/uL — AB (ref 0.1–1.0)
MONOS PCT: 11.7 % (ref 3.0–12.0)
Neutro Abs: 7.4 10*3/uL (ref 1.4–7.7)
Neutrophils Relative %: 74.9 % (ref 43.0–77.0)
Platelets: 269 10*3/uL (ref 150.0–400.0)
RBC: 4.54 Mil/uL (ref 4.22–5.81)
RDW: 14.9 % (ref 11.5–15.5)
WBC: 9.9 10*3/uL (ref 4.0–10.5)

## 2015-12-07 MED ORDER — ALBUTEROL SULFATE HFA 108 (90 BASE) MCG/ACT IN AERS: 2.0000 | INHALATION_SPRAY | Freq: Four times a day (QID) | RESPIRATORY_TRACT | Status: DC | PRN

## 2015-12-07 MED ORDER — ASPIRIN 81 MG PO TABS: 81.0000 mg | ORAL_TABLET | Freq: Every day | ORAL | Status: DC

## 2015-12-07 MED ORDER — TIOTROPIUM BROMIDE MONOHYDRATE 18 MCG IN CAPS: 18.0000 ug | ORAL_CAPSULE | Freq: Every day | RESPIRATORY_TRACT | Status: DC

## 2015-12-07 MED ORDER — FUROSEMIDE 40 MG PO TABS: 40.0000 mg | ORAL_TABLET | Freq: Every day | ORAL | Status: DC

## 2015-12-07 MED ORDER — FLUTICASONE FUROATE-VILANTEROL 100-25 MCG/INH IN AEPB: 1.0000 | INHALATION_SPRAY | Freq: Every day | RESPIRATORY_TRACT | Status: DC

## 2015-12-07 NOTE — Assessment & Plan Note (Signed)
Regular on amiodarone Will have him restart the aspirin

## 2015-12-07 NOTE — Assessment & Plan Note (Signed)
Not taking the furosemide--will have him restart (hold if home weight under 175#)

## 2015-12-07 NOTE — Telephone Encounter (Signed)
Rxs sent electronically.  

## 2015-12-07 NOTE — Progress Notes (Signed)
Pre visit review using our clinic review tool, if applicable. No additional management support is needed unless otherwise documented below in the visit note. 

## 2015-12-07 NOTE — Progress Notes (Signed)
Subjective:    Patient ID: [name], male    DOB: 1925/08/02, 82 y.o.   MRN: [id number]  HPI Here for follow up from hospital---CHF, etc Daughter and wife here  He starts by saying "I don't need the home care" "They don't do a darned thing" Right now--has 24 hour care  He walks with walker Independent with instrumental ADLs No incontinence Aides or daughter get the groceries (daughter takes wife) Family and aides do cooking, etc  Breathing is okay at rest Gets easy dyspnea Not taking aspirin or furosemide Weighing daily--he states stable at 182# or so No chest pain No palpitations No dizziness  No regular cough Using the inhalers  Current Outpatient Prescriptions on File Prior to Visit  Medication Sig Dispense Refill  . acetaminophen (TYLENOL) 325 MG tablet Take 650 mg by mouth every 6 (six) hours as needed for moderate pain.     Marland Kitchen albuterol (PROVENTIL HFA;VENTOLIN HFA) 108 (90 Base) MCG/ACT inhaler Inhale 2 puffs into the lungs every 6 (six) hours as needed for wheezing or shortness of breath. 1 Inhaler 2  . amiodarone (PACERONE) 200 MG tablet Take 1 tablet (200 mg total) by mouth 2 (two) times daily. 60 tablet 3  . Fluticasone Furoate-Vilanterol 100-25 MCG/INH AEPB Inhale 1 puff into the lungs daily. 60 each 3  . tiotropium (SPIRIVA HANDIHALER) 18 MCG inhalation capsule Place 1 capsule (18 mcg total) into inhaler and inhale daily. 30 capsule 3   No current facility-administered medications on file prior to visit.    No Known Allergies  Past Medical History  Diagnosis Date  . Arthritis   . Asthma   . Chicken pox   . Diverticulitis   . Emphysema of lung (HCC)   . Frequent headaches   . Allergy   . Hypertension   . Arrhythmia   . History of colon polyps     Past Surgical History  Procedure Laterality Date  . Appendectomy    . Tonsillectomy    . Back surgery  ~2011    Lumbar fusion    Family History  Problem Relation Age of Onset  . Arthritis  Mother   . Hypertension Mother   . Hyperlipidemia Father   . Hypertension Father   . Alcohol abuse Brother   . Cancer Brother     Lung    Social History   Social History  . Marital Status: Married    Spouse Name: N/A  . Number of Children: 2  . Years of Education: N/A   Occupational History  . 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 Smoking status: Former Smoker -- 0.25 packs/day for 60 years    Types: Cigarettes    Quit date: 08/05/2014  . Smokeless tobacco: Former Neurosurgeon     Comment: quit x 1 month  . Alcohol Use: No  . Drug Use: No  . Sexual Activity: Not Currently   Other Topics Concern  . Not on file   Social History Narrative   Has living will   Wife, then children, should be health care POA. They are formalizing this.   Would accept resuscitation   Not sure about tube feedings    Review of Systems Not sleeping well---up every 2 hours to void. Is able to get back to sleep. Not really tired in AM usually    Objective:   Physical Exam  Constitutional: He appears well-developed. No distress.  Neck:  Normal range of motion. No thyromegaly present.  Cardiovascular: Normal rate and regular rhythm.  Exam reveals no gallop.   No murmur heard. Pulmonary/Chest:  Decreased breath sounds Dyspnea just getting up on table  Musculoskeletal:  1+ edema in feet  Lymphadenopathy:    He has no cervical adenopathy.  Psychiatric:  Upset about home care          Assessment & Plan:

## 2015-12-07 NOTE — Assessment & Plan Note (Signed)
May be the biggest cause of his DOE---fairly limited but able to do ADLs (other than full shower)

## 2015-12-07 NOTE — Patient Instructions (Signed)
Please restart a low dose aspirin 81mg  daily (to help prevent a stroke with your atrial fibrillation). Please restart the furosemide (fluid pill) every day. Only don't take it if your home weight is under 175#

## 2015-12-16 DIAGNOSIS — I480 Paroxysmal atrial fibrillation: Secondary | ICD-10-CM | POA: Diagnosis not present

## 2015-12-16 DIAGNOSIS — M1990 Unspecified osteoarthritis, unspecified site: Secondary | ICD-10-CM | POA: Diagnosis not present

## 2015-12-16 DIAGNOSIS — J45909 Unspecified asthma, uncomplicated: Secondary | ICD-10-CM | POA: Diagnosis not present

## 2015-12-16 DIAGNOSIS — I110 Hypertensive heart disease with heart failure: Secondary | ICD-10-CM | POA: Diagnosis not present

## 2015-12-16 DIAGNOSIS — I5023 Acute on chronic systolic (congestive) heart failure: Secondary | ICD-10-CM | POA: Diagnosis not present

## 2015-12-16 DIAGNOSIS — J439 Emphysema, unspecified: Secondary | ICD-10-CM | POA: Diagnosis not present

## 2015-12-16 LAB — HEPATIC FUNCTION PANEL
ALBUMIN: 3.7 g/dL (ref 3.5–4.7)
ALT: 14 IU/L (ref 0–44)
AST: 20 IU/L (ref 0–40)
Alkaline Phosphatase: 83 IU/L (ref 39–117)
BILIRUBIN TOTAL: 0.5 mg/dL (ref 0.0–1.2)
Bilirubin, Direct: 0.18 mg/dL (ref 0.00–0.40)
TOTAL PROTEIN: 6.7 g/dL (ref 6.0–8.5)

## 2015-12-16 LAB — TSH: TSH: 1.72 u[IU]/mL (ref 0.450–4.500)

## 2015-12-21 ENCOUNTER — Telehealth: Payer: Self-pay

## 2015-12-21 NOTE — Telephone Encounter (Signed)
PLEASE NOTE: All timestamps contained within this report are represented as [place] Standard Time. CONFIDENTIALTY NOTICE: This fax transmission is intended only for the addressee. It contains information that is legally privileged, confidential or otherwise protected from use or disclosure. If you are not the intended recipient, you are strictly prohibited from reviewing, disclosing, copying using or disseminating any of this information or taking any action in reliance on or regarding this information. If you have received this fax in error, please notify us immediately by telephone so that we can arrange for its return to [place]. Phone: [phone], Toll-Free: [phone], Fax: [phone] Page: 1 of 1 Call Id: [id number] [facility] Day - Client Nonclinical Telephone Record [facility] Call Center Client [facility] Day - Client Client Site [facility] - Day Physician Tillman Abide - MD Contact Type Call Who Is Calling Patient / Member / Family / Caregiver Caller Name [name] Caller Phone Number [phone] Patient Name [name] Call Type Message Only Information Provided Reason for Call Request for General Office Information Initial Comment Caller states, wife is asking if she would like to stop home-care. Aggravated with workers. Additional Comment Would like to end home-care as soon as possible. Call Closed By: Terrilee Files Transaction Date/Time: 12/19/2015 3:04:55 PM (ET)

## 2015-12-21 NOTE — Telephone Encounter (Signed)
Spoke to [employer]. She said they have released [facility] skilled nursing. The are still using [facility]. She is talking with her Mom about the Aides. She will not let her Mom dismiss the Aides.

## 2015-12-21 NOTE — Telephone Encounter (Signed)
[name] returned your call Best number [phone]

## 2015-12-21 NOTE — Telephone Encounter (Signed)
Please call him (and maybe family) We discussed cutting out overnight aides first---and then maybe going to twice a day for 2-3 hours. Stopping altogether is probably not a good idea

## 2015-12-21 NOTE — Telephone Encounter (Signed)
Tried to call wife, she was busy. I left a message for [name], daughter, to call back

## 2015-12-24 DIAGNOSIS — I480 Paroxysmal atrial fibrillation: Secondary | ICD-10-CM | POA: Diagnosis not present

## 2015-12-24 DIAGNOSIS — I5023 Acute on chronic systolic (congestive) heart failure: Secondary | ICD-10-CM | POA: Diagnosis not present

## 2015-12-24 DIAGNOSIS — I110 Hypertensive heart disease with heart failure: Secondary | ICD-10-CM | POA: Diagnosis not present

## 2015-12-24 DIAGNOSIS — J439 Emphysema, unspecified: Secondary | ICD-10-CM | POA: Diagnosis not present

## 2015-12-31 ENCOUNTER — Other Ambulatory Visit: Payer: Self-pay | Admitting: *Deleted

## 2015-12-31 ENCOUNTER — Encounter: Payer: Self-pay | Admitting: *Deleted

## 2015-12-31 NOTE — Patient Outreach (Signed)
[facility]) Care Management  [facility] CM Routine Home Visit 12/31/2015  Linken Greear November 20, 1925 [id number]  Dacorian Planty Macer is an 82 y.o. male followed by [facility] CM for transition of care after recent IP hospital visit, April 18-25, 2017 for A-Fib with RVR and CHF management.  Mr. Helser was discharged with home O2.  Mr. Mckittrick successfully completed transition of care without a readmission to the hospital, and is now followed by [facility] Community CM for self-health management of chronic disease state of CHF.    Today, Mr. Buckbee states that he "is having a bad day," stating that he "just feels tired and run down."  Mr. Duren stated that he has recently had "several great days, but today isn't one of them."  Mr. Kieran denies specific symptoms, such as pain, nausea, or fever.  I advised Mr. Anzaldua and his family that if he continues to feel bad to contact his PCP to arrange an appointment.  Mr. Vukelich reports that he has been taking his medications as prescribed and weighing daily.  Mr. Robe does not record his daily weights, stating, "my weight doesn't change; it is always between 180 and 185 pounds."  Mr. Bucceri is able to accurately describe steps he would take for weight gain greater than 3 pounds overnight/ 5 pounds in one week, stating that he "would call the doctor."  Mr. Karges states that he has begun using his home oxygen during the day time hours, but reports that he cannot tolerate the tubing while he is asleep, reporting that it interferes with his sleep and keeps him "awake all night."  Mr. Shrider has baseline shortness of breath, which he says usually occurs when he "has walked or talked too much."  [name] reports that he checks his SaO2 levels "regularly throughout the day, every day," and reports that he gets consistently gets values between 92-96% on room air when he is resting, and with O2 when it is in place.   Mr. Shangraw reports that home health [facility])  services have stopped, and reports that he continues to use a walker to ambulate 100% of the time.  He reports no falls today.  Mr. Zahorik reports that his family has hired assistants/ sitters come in every day to assist he and his wife with any needs that arise, however, Mr. Dunkley expresses a desire for this assistance to stop, as he feels as if "it is too much-- we don't need all this help coming in the house everyday."   Mr. Etheridge states that he has attended his recent provider appointments, and together we reviewed his upcoming scheduled appointments.  Mr. Wittig reports that he has continued taking his medications as they are prescribed and today we performed a medication reconciliation.  Mr. Mehl has a good understanding of the purpose, dosing, and scheduling of his medications, which are prepared by his family members for him daily.   During our visit today, Mr. Thorstenson again stated that he believes he is doing well enough that he does not see a need for "all these people coming to check on me everyday."  We again discussed the overall purpose of private duty sitters and [facility] Community CM services for regular health maintenance and safety.  Mr. Marchewka family expressed that they would like to have Mr. Duggal continue participating with in-home PT, however, Mr. Giunta voiced that he does not wish to have additional in-home PT services.   Today, Mr. Damm agreed to consider  ongoing in-home care assistance options for additional HH PT and continued [facility] Community CM involvement.  We also discussed overall plan of care with Mr. Placencia and his wife and daughter during [facility] Community CM home visit today.   Subjective: "I am tired of having so many people in and out of the house.  I don't think I need all of this help."  Objective:   BP 104/60   Pulse 60   Resp 16   Wt 181 lb (82.1 kg)   SpO2 94%   BMI 23.88 kg/m     Review of Systems  Constitutional: Positive for malaise/fatigue. Negative for fever and  weight loss.  Respiratory: Positive for shortness of breath. Negative for cough and wheezing.        Patient continues to exhibit baseline shortness of breath with activity; patient recovers well when at rest, but has slight shortness of breath at all times.  Patient wears O2 via [place] at 2-3L/min during the day.  Patient states that he cannot tolerate O2 during sleep, and reports that he does not use O2 during sleeping hours, stating that he has made his physicians aware of this.  Cardiovascular: Positive for leg swelling. Negative for chest pain.       Patient has minimal swelling (+1) in bilateral ankles  Gastrointestinal: Negative.  Negative for abdominal pain, heartburn and nausea.  Genitourinary: Negative.   Musculoskeletal: Positive for back pain. Negative for falls.       Patient reports intermittent chronic back pain; denies pain today during [facility] Community CM home visit.  Skin: Negative.   Neurological: Positive for weakness. Negative for dizziness.  Psychiatric/Behavioral: Negative for depression. The patient is not nervous/anxious.     Physical Exam  Constitutional: He is oriented to person, place, and time. He appears well-developed and well-nourished. No distress.  Cardiovascular: Normal rate, regular rhythm and intact distal pulses.   Heart sounds distant  Respiratory: No respiratory distress. He has no wheezes. He has no rales.  Diminshed breath sounds throughout A/L/P lung fields; this is patient's baseline.  As noted in ROS, patient wears O2 at 2-3 L/min via [place] during daytime hours  GI: Soft. Bowel sounds are normal.  Musculoskeletal: He exhibits edema.  See ROS; patient has minimal bilateral ankle swelling, at patient's baseline  Neurological: He is alert and oriented to person, place, and time.  Skin: Skin is warm and dry.  Psychiatric: He has a normal mood and affect. His behavior is normal. Judgment and thought content normal.    Encounter Medications:   Outpatient  Encounter Prescriptions as of 12/31/2015  Medication Sig  . acetaminophen (TYLENOL) 325 MG tablet Take 650 mg by mouth every 6 (six) hours as needed for moderate pain.   Marland Kitchen albuterol (PROVENTIL HFA;VENTOLIN HFA) 108 (90 Base) MCG/ACT inhaler Inhale 2 puffs into the lungs every 6 (six) hours as needed for wheezing or shortness of breath.  Marland Kitchen amiodarone (PACERONE) 200 MG tablet Take 1 tablet (200 mg total) by mouth 2 (two) times daily.  Marland Kitchen aspirin 81 MG tablet Take 1 tablet (81 mg total) by mouth daily.  . fluticasone furoate-vilanterol (BREO ELLIPTA) 100-25 MCG/INH AEPB Inhale 1 puff into the lungs daily.  . furosemide (LASIX) 40 MG tablet Take 1 tablet (40 mg total) by mouth daily.  Marland Kitchen tiotropium (SPIRIVA HANDIHALER) 18 MCG inhalation capsule Place 1 capsule (18 mcg total) into inhaler and inhale daily.   No facility-administered encounter medications on file as of 12/31/2015.  Functional Status:   In your present state of health, do you have any difficulty performing the following activities: 11/13/2015 10/28/2015  Hearing? Malvin Johns  Vision? N N  Difficulty concentrating or making decisions? N N  Walking or climbing stairs? Y Y  Dressing or bathing? Y N  Doing errands, shopping? Malvin Johns  Preparing Food and eating ? - Y  Using the Toilet? - N  In the past six months, have you accidently leaked urine? - N  Do you have problems with loss of bowel control? - N  Managing your Medications? - Y  Managing your Finances? - N  Housekeeping or managing your Housekeeping? - N  Some recent data might be hidden    Fall/Depression Screening:    PHQ 2/9 Scores 11/13/2015 10/28/2015 10/13/2015 07/28/2015 09/09/2014  PHQ - 2 Score 0 1 3 0 0    Assessment:  Mr. Fouche continues to feel better since his recent hospitalization, and has made good progress in his recuperation.  Although he remains frustrated with his current state of health, along with the requirements placed on him to maintain a positive state of health  while at home, he is more accepting of his current health situation from an overall standpoint, and has begun attending all provider appointments.  Home Health services have been completed, and Mr. Ericsson family believes that he would possibly benefit from additional [facility] PT; however, Mr. Anctil disagrees, and believes that he does not need additional assistance at home.  Mr. Migliaccio has very good support systems through his family members, and has agreed today to consider his options for ongoing care at home.  Plan:   Mr. Gosney will continue to take his prescribed medications and wear his O2 as prescribed.   Mr. Newmark will continue monitoring his daily weights and pulse oximetry readings.   Mr. Sawaya and/ or his family will contact his provider if he gains > 2 lbs overnight or > 5 lbs in one week.   Mr. Chancy will contact his medical provider team should concerns, problems, or issues arise, and will attend scheduled provider appointments.   Continued [facility] Community CM follow up for self management of chronic disease state of CHF, with telephone outreach to patient's daughter scheduled in 2 weeks.   Caryl Pina, RN, BSN, [employer] [facility] Care Management  [phone]

## 2016-01-05 ENCOUNTER — Ambulatory Visit: Payer: Medicare Other | Admitting: Internal Medicine

## 2016-01-14 ENCOUNTER — Other Ambulatory Visit: Payer: Self-pay | Admitting: *Deleted

## 2016-01-14 NOTE — Patient Outreach (Signed)
[facility]) Care Management [place] Telephone Outreach 01/14/2016  [name] 1926-02-24 [id number]   Successful telephone outreach to daughter/ caregiver [name] (on [name] CM written consent) of [name], 82 y.o. male followed by [place] for transition of care after recent IP Marks visit, April 18-25, 2017 for A-Fib with RVR and CHF management.  [name] was discharged with home O2.  [name] successfully completed transition of care without a readmission to [name], and is now followed by [name] for self-health management of chronic disease state of CHF.  During previous [place] in-home visits, patient had requested that I direct [facility] follow up efforts through [employer].  HIPAA verified through [name], patient's caregiver/ daughter.  Today, [name] reports that [name] is "doing about Kenneth same," although she states that he is having some "mild back discomfort over Kenneth last few days."  [name] reports that she believes this may be because patient isn't "moving around as much as he should be."  [name] reports that [name] is still undecided about whether or not he should become re-involved with home health [name]) PT, which ended several weeks ago.  [name] has verbalized several times that he wishes for all in-home care to stop, including home health and private duty sitters hired by Kenneth family.  [name] reports that [name] has agreed to have continued [name] involvement, and also reports that Kenneth private duty sitters have decreased their hours in patient's home to only day-time hours.  [name] reports that she believes [name] is currently happier with Kenneth in-home care as it is currently scheduled.  [name] reports that [name] would like to have additional (general) education/ instruction in self-health management of CHF, and states that she would like to have continued [name] CM to check  in on patient status "at least one more time."  I discussed with [name] that [name] had met most of his [name], and agreed to [name] in-home visit to provide education/ educational materials around self-health management of chronic disease state of CHF.   I asked [name] to discuss specific/ individual patient Marks with him, to be re-addressed directly with [name] during next [name] in-home visit, and she agreed to do so.  I also discussed option of transitioning patient to [name] telephonic RN health Marks, and she said she would consider.   I advised [name] to contact [name] provider should his back discomfort not improve, which she also agreed to do.  [name] continues to report that patient is taking his medications as prescribed, using his O2 during daytime hours, and attending provider appointments as scheduled.  [name] denies further concerns, question, needs, or problems around [name] care today and we scheduled in-home visit in 2 weeks, where I will provide [name] with education/ educational materials around self-health management of chronic disease state of CHF, and discuss his personal Marks for his health with him.  Plan:  [name] will continue to take hisprescribed medications and wear his O2 asprescribed.  [name] will continue monitoring his daily weights and pulse oximetry readings.  [name] and/ or his family will contact his provider if he gains >2 lbs overnight or >5 lbs in one week.  [name] will contact his medical provider team should concerns, problems, or issues arise, and will attend scheduled provider appointments.  Continued [facility] Community CM follow up  for self management of chronic disease state of CHF, with home visit scheduled in 2 weeks.   Kenneth Rack, RN, BSN, [employer] [facility] Care Management  [phone]

## 2016-01-20 ENCOUNTER — Telehealth: Payer: Self-pay

## 2016-01-20 NOTE — Telephone Encounter (Signed)
Mrs Cutbirth wanted Dr Alphonsus Sias to know that pt is having burning in his mouth daily; not sure what has caused the burning. Mrs Barranco wants to know if any of pts meds might cause pt to have burning sensation ini his mouth; pt not eating well now due to burning sensation. Burning sensation occurs when he first wakes up or after he eats. Mrs Chihuahua request cb. Pt does not have redness, rash or sores in his mouth.[facility] [employer].

## 2016-01-21 NOTE — Telephone Encounter (Signed)
He should be rinsing his mouth carefully after using his inhalers. If he is on any vitamins--try off that as they can occasionally affect the mouth (especially if they have zinc in them)

## 2016-01-22 NOTE — Telephone Encounter (Signed)
Left detailed message.   

## 2016-01-29 ENCOUNTER — Encounter: Payer: Self-pay | Admitting: *Deleted

## 2016-01-29 ENCOUNTER — Other Ambulatory Visit: Payer: Self-pay | Admitting: *Deleted

## 2016-01-29 NOTE — Patient Outreach (Addendum)
[facility]) Care Management  [name] Routine Home Visit for discharge 01/29/2016  [name] 03/04/1926 [id number]  Felida "[name]" Mollenkopf is an 82 y.o. male followed by [place] for transition of care after recent IP hospital visit, April 18-25, 2017 for A-Fib with RVR and CHF management.[name] was discharged with home O2. [name] successfully completed transition of care without a readmission to the hospital, and is now followed by [place] for self-health management of chronic disease state of CHF.   Today, [name] states that he "is having a bad day," stating that he "is having terrible gas pain," which developed suddenly after eating breakfast.  Mr. Schrieber denies specific symptoms, such as nausea, or fever, and just maintains that he is "having gas pains."  [name] stated that he has had these pains before, which is usually relieved "by just lying down."  By the time our visit this afternoon was finished, [name] reported that his pain had "gone away."  [name] stated that he has had several recent episodes of gas pain, and I advised he and his family that if he continues having regular symptoms to contact his PCP to arrange an evaluation appointment.  [name] reports that he has continued taking his medications as prescribed and weighing daily.[name] reports a weight this morning of 184 pounds. We again reviewed the importance of daily weight/ following weight gain guidelines for self-health management of chronic disease state of CHF, and I provided [name] and his family with additional EMMI educational materials, which we reviewed together today.    [name] has continued using his home oxygen during the day time hours, and is not currently wearing his O2 during [place] in home visit.  SaO2 levels today were 93-94% on room air.  [name] remains at his baseline with shortness of breath with activity and ankle edema, and today neither condition appears outside of his baseline  from previous [place] in-home visits.  [name] continues to use a walker to ambulate 100% of the time.  He reports no recent falls today.    [name] reports that his family has continued using private duty hired Chief of Staff, which come in every day to assist he and his wife with their needs. [name] again expresses a desire for this assistance to stop, as he feels "it is not necessary."  During previous [facility] CM home visits, [name]'s family expressed that they would like to have him continue participating with in-home PT, which [name] continues to consider, but remains undecided.  I encouraged [name] to continue considering this option, and for he and his family members to discuss this option with [name]'s PCP moving forward.  [name] reports that he has attended his recent provider appointments, and together we reviewed his upcoming scheduled appointments. [name] denies further concerns, issues, needs, or problems today.  I discussed with [name] and his family that he has successfully met his previously established [facility] Community CM goals, and we all agreed that he is ready for discharge from [place] program.  [name] denied the need to have ongoing [facility] CM follow up through telephonic [place].    Subjective: "I think my condition has evened out..... I have bad days and good days."  Objective:    BP 124/60   Pulse 63   Resp 20   Wt 184 lb (83.5 kg)   SpO2 93%   BMI 24.28 kg/m    Review of Systems  Constitutional: Positive for malaise/fatigue.  Negative for fever and weight loss.  Respiratory: Positive for shortness of breath. Negative for cough.        Shortness of breath with activity; at patient's baseline; patient wears O2 at home at 2 L/min via [place], "most of the time."  Patient is not wearing O2 during today's visit, and SaO2 level is 93-94%  Cardiovascular: Positive for leg swelling. Negative for chest pain.       Minimal (+1) bilateral ankle edema; at patient's previous baseline   Gastrointestinal: Positive for abdominal pain.       Patient reports that he is having "terrible gas pain" today that started after eating breakfast; during our time together this afternoon, patient eventually reported that the pain "is gone now."  Musculoskeletal: Negative for falls.  Psychiatric/Behavioral: The patient is not nervous/anxious.     Physical Exam  Constitutional: He is oriented to person, place, and time. He appears well-developed and well-nourished.  Cardiovascular: Normal rate, regular rhythm, normal heart sounds and intact distal pulses.   Respiratory: Effort normal and breath sounds normal. No respiratory distress. He has no wheezes. He has no rales.  Diminished breath sounds throughout A/L/P lung fields; this is patient's baseline breath sounds  GI: Soft. Bowel sounds are normal.  Musculoskeletal: He exhibits edema.  +1 minimal ankle edema bilaterally  Neurological: He is alert and oriented to person, place, and time.  Skin: Skin is warm and dry. No erythema.  Psychiatric: He has a normal mood and affect. His behavior is normal. Thought content normal.    Encounter Medications:   Outpatient Encounter Prescriptions as of 01/29/2016  Medication Sig  . acetaminophen (TYLENOL) 325 MG tablet Take 650 mg by mouth every 6 (six) hours as needed for moderate pain.   Marland Kitchen albuterol (PROVENTIL HFA;VENTOLIN HFA) 108 (90 Base) MCG/ACT inhaler Inhale 2 puffs into the lungs every 6 (six) hours as needed for wheezing or shortness of breath.  Marland Kitchen amiodarone (PACERONE) 200 MG tablet Take 1 tablet (200 mg total) by mouth 2 (two) times daily.  Marland Kitchen aspirin 81 MG tablet Take 1 tablet (81 mg total) by mouth daily.  . fluticasone furoate-vilanterol (BREO ELLIPTA) 100-25 MCG/INH AEPB Inhale 1 puff into the lungs daily.  . furosemide (LASIX) 40 MG tablet Take 1 tablet (40 mg total) by mouth daily.  Marland Kitchen tiotropium (SPIRIVA HANDIHALER) 18 MCG inhalation capsule Place 1 capsule (18 mcg total) into  inhaler and inhale daily.   No facility-administered encounter medications on file as of 01/29/2016.     Functional Status:   In your present state of health, do you have any difficulty performing the following activities: 11/13/2015 10/28/2015  Hearing? Tempie Donning  Vision? N N  Difficulty concentrating or making decisions? N N  Walking or climbing stairs? Y Y  Dressing or bathing? Y N  Doing errands, shopping? Tempie Donning  Preparing Food and eating ? - Y  Using the Toilet? - N  In the past six months, have you accidently leaked urine? - N  Do you have problems with loss of bowel control? - N  Managing your Medications? - Y  Managing your Finances? - N  Housekeeping or managing your Housekeeping? - N  Some recent data might be hidden    Fall/Depression Screening:    PHQ 2/9 Scores 11/13/2015 10/28/2015 10/13/2015 07/28/2015 09/09/2014  PHQ - 2 Score 0 1 3 0 0    Assessment:  [name] continues to do well and make progress in self-health management of chronic disease state of  CHF . Although he remains frustrated with his current state of health, along with the requirements placed on him to maintain a positive state of health while at home, he is more accepting of his current health situation from an overall standpoint, and has begun attending all provider appointments.Mr. Vanecek has very good support systems through his family members, and agrees that he is ready for discharge from [place] program.  Plan:   Will discharge [name] from [place] program, as he has successfully met his previously established goals, and will make his PCP aware of same.  It has been a pleasure participating in [name]'s care,  [name], RN, BSN, [employer] Coordinator [facility] Care Management  [phone]

## 2016-02-01 ENCOUNTER — Encounter: Payer: Self-pay | Admitting: *Deleted

## 2016-02-01 NOTE — Telephone Encounter (Signed)
This encounter was created in error - please disregard.

## 2016-02-11 ENCOUNTER — Encounter: Payer: Self-pay | Admitting: Internal Medicine

## 2016-02-11 ENCOUNTER — Ambulatory Visit (INDEPENDENT_AMBULATORY_CARE_PROVIDER_SITE_OTHER): Payer: Medicare Other | Admitting: Internal Medicine

## 2016-02-11 VITALS — BP 106/54 | HR 72 | Ht 74.0 in | Wt 173.0 lb

## 2016-02-11 DIAGNOSIS — I5022 Chronic systolic (congestive) heart failure: Secondary | ICD-10-CM | POA: Diagnosis not present

## 2016-02-11 DIAGNOSIS — I428 Other cardiomyopathies: Secondary | ICD-10-CM

## 2016-02-11 DIAGNOSIS — I429 Cardiomyopathy, unspecified: Secondary | ICD-10-CM

## 2016-02-11 DIAGNOSIS — R001 Bradycardia, unspecified: Secondary | ICD-10-CM | POA: Diagnosis not present

## 2016-02-11 DIAGNOSIS — I471 Supraventricular tachycardia: Secondary | ICD-10-CM

## 2016-02-11 DIAGNOSIS — R0609 Other forms of dyspnea: Secondary | ICD-10-CM

## 2016-02-11 NOTE — Patient Instructions (Addendum)
Medication Instructions: - Your physician recommends that you continue on your current medications as directed. Please refer to the Current Medication list given to you today.  Labwork: - none  Procedures/Testing: - none  Follow-Up: - Your physician recommends that you schedule a follow-up appointment: with Dr. Belia Heman- Pulmonary  - Your physician wants you to follow-up in: 6 months with Dr. Graciela Husbands. You will receive a reminder letter in the mail two months in advance. If you don't receive a letter, please call our office to schedule the follow-up appointment.  Any Additional Special Instructions Will Be Listed Below (If Applicable).     If you need a refill on your cardiac medications before your next appointment, please call your pharmacy.

## 2016-02-11 NOTE — Progress Notes (Signed)
Patient Care Team: Kenneth Schwalbeichard I Letvak, MD as PCP - General (Internal Medicine) Kenneth SalviaSteven C Daizha Anand, MD as Consulting Physician (Cardiology) Kenneth FullingKurian Kasa, MD as Consulting Physician (Pulmonary Disease) Kenneth Freezeina A Hackney, FNP as Nurse Practitioner (Family Medicine) Kenneth Ibaimothy J Gollan, MD as Consulting Physician (Cardiology)   HPI  [facility] 82 y.[facility]. male Seen in follow-up for SVT. Included adenosine sensitive tachycardia as well as apparently atrial fibrillation although the only strips available and demonstrated PACs.  He is not complaining tachypalpitations  he was hospitalized in April for congestive heart failure and tachycardia. This was described as atrial fibrillation; this is wrong. He was an AV nodal reentry;  No recurrent tachypalpitations according to his family  Tolerating the amio without apparent difficulties  Complaining of mid and infra scapular pain as well as leg pains    DATE PR interval QRSduration Dose-  *12**/16 -- 94 0  1/17 -- 94 0  6/17 208 104 AMIO  9/17 23 138 AMIO     Records and Results Reviewed echocardiogram 2/16 [facility] normal LV function;  Repeat echocardiogram 4/17 demonstrated EF 25-30%;  he was started on amiodarone having previously declined to take flecainide in the context of what had been normal LV function  Sob STILL a problem  Not wearing O2 very often, except when sitting arround;  Has not followed up with pulmonary  Some edema, not taking his diuretics     Chest CT scan evidence of emphysema  Past Medical History:  Diagnosis Date  . Allergy   . Arrhythmia   . Arthritis   . Asthma   . Chicken pox   . Diverticulitis   . Emphysema of lung (HCC)   . Frequent headaches   . History of colon polyps   . Hypertension     Past Surgical History:  Procedure Laterality Date  . APPENDECTOMY    . BACK SURGERY  ~2011   Lumbar fusion  . TONSILLECTOMY      Current Outpatient Prescriptions  Medication Sig Dispense Refill  .  acetaminophen (TYLENOL) 325 MG tablet Take 650 mg by mouth every 6 (six) hours as needed for moderate pain.     Marland Kitchen. albuterol (PROVENTIL HFA;VENTOLIN HFA) 108 (90 Base) MCG/ACT inhaler Inhale 2 puffs into the lungs every 6 (six) hours as needed for wheezing or shortness of breath. 1 Inhaler 5  . amiodarone (PACERONE) 200 MG tablet Take 1 tablet (200 mg total) by mouth 2 (two) times daily. 60 tablet 3  . aspirin 81 MG tablet Take 1 tablet (81 mg total) by mouth daily. 30 tablet 11  . fluticasone furoate-vilanterol (BREO ELLIPTA) 100-25 MCG/INH AEPB Inhale 1 puff into the lungs daily. 60 each 5  . furosemide (LASIX) 40 MG tablet Take 1 tablet (40 mg total) by mouth daily. 30 tablet 11  . tiotropium (SPIRIVA HANDIHALER) 18 MCG inhalation capsule Place 1 capsule (18 mcg total) into inhaler and inhale daily. 30 capsule 5   No current facility-administered medications for this visit.     No Known Allergies    Review of Systems negative except from HPI and PMH  Physical Exam BP (!) 106/54 (BP Location: Left Arm, Patient Position: Sitting, Cuff Size: Normal)   Pulse 72   Ht 6\' 2"  (1.88 m)   Wt 173 lb (78.5 kg)   BMI 22.21 kg/m  Well developed and well nourished in no acute distress HENT normal E scleral and icterus clear Neck Supple JVP<,10 carotids brisk and  full Regular rate and rhythm without murmurs Soft with active bowel sounds No clubbing cyanosis no Edema Skin Warm and Dry  ECG sinus 72 23/14/47 Right bundle branch  Assessment and  Plan  SVT-incessant  Sinus bradycardia  COPD/emphysema  Dyspnea on exertion  High risk medication surveillance  NICM  CHF chronic systolic   RBBB  Back Pain    He is mildly volume overloaded. I've encouraged him to resume his diuretic  His COPD is poorly controlled. I've encouraged him to follow-up with pulmonary and to wear his oxygen is much as he can during the day  Rhythm is well-controlled on amiodarone; he may be  contributing to the conduction delay manifested by first-degree AV block and right bundle branch block which is more apparent than before  Back pains I dont think are cardiac  Sinus brady not an issue on amio

## 2016-02-18 ENCOUNTER — Encounter: Payer: Self-pay | Admitting: Family

## 2016-02-18 ENCOUNTER — Ambulatory Visit: Payer: Medicare Other | Attending: Family | Admitting: Family

## 2016-02-18 VITALS — BP 115/76 | HR 65 | Resp 20 | Ht 73.0 in | Wt 196.0 lb

## 2016-02-18 DIAGNOSIS — M1990 Unspecified osteoarthritis, unspecified site: Secondary | ICD-10-CM | POA: Insufficient documentation

## 2016-02-18 DIAGNOSIS — I5022 Chronic systolic (congestive) heart failure: Secondary | ICD-10-CM

## 2016-02-18 DIAGNOSIS — I110 Hypertensive heart disease with heart failure: Secondary | ICD-10-CM | POA: Diagnosis not present

## 2016-02-18 DIAGNOSIS — J45909 Unspecified asthma, uncomplicated: Secondary | ICD-10-CM | POA: Insufficient documentation

## 2016-02-18 DIAGNOSIS — F0391 Unspecified dementia with behavioral disturbance: Secondary | ICD-10-CM

## 2016-02-18 DIAGNOSIS — K5792 Diverticulitis of intestine, part unspecified, without perforation or abscess without bleeding: Secondary | ICD-10-CM | POA: Insufficient documentation

## 2016-02-18 DIAGNOSIS — R51 Headache: Secondary | ICD-10-CM | POA: Diagnosis not present

## 2016-02-18 DIAGNOSIS — Z86010 Personal history of colonic polyps: Secondary | ICD-10-CM | POA: Diagnosis not present

## 2016-02-18 DIAGNOSIS — I4891 Unspecified atrial fibrillation: Secondary | ICD-10-CM | POA: Insufficient documentation

## 2016-02-18 DIAGNOSIS — J439 Emphysema, unspecified: Secondary | ICD-10-CM | POA: Insufficient documentation

## 2016-02-18 DIAGNOSIS — I509 Heart failure, unspecified: Secondary | ICD-10-CM | POA: Diagnosis not present

## 2016-02-18 DIAGNOSIS — I480 Paroxysmal atrial fibrillation: Secondary | ICD-10-CM

## 2016-02-18 NOTE — Patient Instructions (Signed)
Continue weighing daily and call for an overnight weight gain of > 2 pounds or a weekly weight gain of >5 pounds. 

## 2016-02-18 NOTE — Progress Notes (Signed)
Subjective:    Patient ID: [name], male    DOB: [birth year], 82 y.o.   MRN: [id number]  Congestive Heart Failure  Presents for follow-up visit. The disease course has been stable. Associated symptoms include edema, fatigue and shortness of breath. Pertinent negatives include no abdominal pain, chest pain, orthopnea or palpitations. The symptoms have been stable. Past treatments include salt and fluid restriction. The treatment provided moderate relief. Compliance with prior treatments has been variable. His past medical history is significant for chronic lung disease and HTN. There is no history of DM.  Atrial Fibrillation  Presents for follow-up visit. Symptoms include shortness of breath. Symptoms are negative for chest pain, dizziness, hypertension, palpitations and weakness. The symptoms have been stable. Past treatments include rate control and aspirin. Compliance with prior treatments has been variable. Past medical history includes atrial fibrillation, CHF and HTN.   Past Medical History:  Diagnosis Date  . Allergy   . Arrhythmia   . Arthritis   . Asthma   . Chicken pox   . Diverticulitis   . Emphysema of lung (HCC)   . Frequent headaches   . History of colon polyps   . Hypertension     Past Surgical History:  Procedure Laterality Date  . APPENDECTOMY    . BACK SURGERY  ~2011   Lumbar fusion  . TONSILLECTOMY      Family History  Problem Relation Age of Onset  . Arthritis Mother   . Hypertension Mother   . Hyperlipidemia Father   . Hypertension Father   . Alcohol abuse Brother   . Cancer Brother     Lung    Social History  Substance Use Topics  . Smoking status: Former Smoker    Packs/day: 0.25    Years: 60.00    Types: Cigarettes    Quit date: 08/05/2014  . Smokeless tobacco: Former NeurosurgeonUser     Comment: quit x 1 month  . Alcohol use No    No Known Allergies  Prior to Admission medications   Medication Sig Start Date End Date Taking? Authorizing  Provider  acetaminophen (TYLENOL) 325 MG tablet Take 650 mg by mouth every 6 (six) hours as needed for moderate pain.    Yes Historical Provider, MD  albuterol (PROVENTIL HFA;VENTOLIN HFA) 108 (90 Base) MCG/ACT inhaler Inhale 2 puffs into the lungs every 6 (six) hours as needed for wheezing or shortness of breath. 12/07/15  Yes Karie Schwalbeichard I Letvak, MD  amiodarone (PACERONE) 200 MG tablet Take 1 tablet (200 mg total) by mouth 2 (two) times daily. 11/27/15  Yes Duke SalviaSteven C Klein, MD  aspirin 81 MG tablet Take 1 tablet (81 mg total) by mouth daily. 12/07/15  Yes Karie Schwalbeichard I Letvak, MD  fluticasone furoate-vilanterol (BREO ELLIPTA) 100-25 MCG/INH AEPB Inhale 1 puff into the lungs daily. 12/07/15  Yes Karie Schwalbeichard I Letvak, MD  furosemide (LASIX) 40 MG tablet Take 1 tablet (40 mg total) by mouth daily. 12/07/15  Yes Karie Schwalbeichard I Letvak, MD  tiotropium (SPIRIVA HANDIHALER) 18 MCG inhalation capsule Place 1 capsule (18 mcg total) into inhaler and inhale daily. 12/07/15  Yes Karie Schwalbeichard I Letvak, MD      Review of Systems  Constitutional: Positive for fatigue. Negative for appetite change.  HENT: Negative for congestion, postnasal drip and sore throat.   Eyes: Negative.   Respiratory: Positive for shortness of breath. Negative for cough and chest tightness.   Cardiovascular: Positive for leg swelling. Negative for chest pain and palpitations.  Gastrointestinal:  Negative for abdominal distention and abdominal pain.  Endocrine: Negative.   Genitourinary: Negative.   Musculoskeletal: Positive for back pain. Negative for neck pain.  Skin: Negative.   Allergic/Immunologic: Negative.   Neurological: Negative for dizziness, weakness and light-headedness.  Hematological: Negative for adenopathy. Does not bruise/bleed easily.  Psychiatric/Behavioral: Positive for dysphoric mood and sleep disturbance. The patient is not nervous/anxious.        Objective:   Physical Exam  Constitutional: He is oriented to person, place, and time. He  appears well-developed and well-nourished.  HENT:  Head: Normocephalic and atraumatic.  Eyes: Conjunctivae are normal. Pupils are equal, round, and reactive to light.  Neck: Normal range of motion. Neck supple.  Cardiovascular: Normal rate.  An irregular rhythm present.  Pulmonary/Chest: Effort normal. He has no wheezes. He has no rales.  Abdominal: Soft. He exhibits no distension. There is no tenderness.  Musculoskeletal: He exhibits edema (mild edema in bilateral ankles). He exhibits no tenderness.  Neurological: He is alert and oriented to person, place, and time.  Skin: Skin is warm and dry.  Psychiatric: He has a normal mood and affect. Thought content normal. He is agitated.  Nursing note and vitals reviewed.   BP 115/76   Pulse 65   Resp 20   Ht 6\' 1"  (1.854 m)   Wt 196 lb (88.9 kg)   SpO2 94%   BMI 25.86 kg/m        Assessment & Plan:  1: Chronic heart failure with reduced ejection fraction- Patient presents with fatigue and shortness of breath upon moderate exertion (Class II). He says that symptoms quickly improve upon rest. Does have mild edema in bilateral ankles but doesn't feel like they are anymore swollen. Does try to elevate them when he's sitting for long periods of time at home in his recliner. Did walk into the office with his walker. Does have oxygen that he can use at home as needed but says that he really doesn't use it very often but if he does, it'll be during the daytime. Is not adding salt to his food. Continues to weigh himself at home and says that his weight is quite a bit lower at home then in the office. He is going to get some new batteries to see if that helps. By our scale, he's gained 3.4 pounds since the last time he was here. Reminded to call for an overnight weight gain of >2 pounds or a weekly weight gain of >5 pounds. Is not interested in medication changes at this time.  2: Atrial fibrillation- Currently rate controlled at this time on  amiodarone. Also is taking aspirin. Just saw his cardiologist Graciela Husbands) last week. 3: Dementia- Patient was getting agitated when questioned about his medications. He kept saying that he takes 6 medications daily and that's it. Explained that I was just trying to confirm what medications he was taking. Is due to see his PCP in October. 2017.  Patient did not bring his medications nor a list. Each medication was verbally reviewed with the patient and he was encouraged to bring the bottles to every visit to confirm accuracy of list.  Return in 3 months or sooner for any questions/problems before then.

## 2016-03-03 ENCOUNTER — Telehealth: Payer: Self-pay | Admitting: Internal Medicine

## 2016-03-03 ENCOUNTER — Ambulatory Visit: Payer: Medicare Other | Admitting: Internal Medicine

## 2016-03-03 ENCOUNTER — Encounter: Payer: Self-pay | Admitting: Internal Medicine

## 2016-03-03 ENCOUNTER — Ambulatory Visit (INDEPENDENT_AMBULATORY_CARE_PROVIDER_SITE_OTHER): Payer: Medicare Other | Admitting: Internal Medicine

## 2016-03-03 VITALS — BP 128/76 | HR 73 | Temp 98.0°F | Wt 192.5 lb

## 2016-03-03 DIAGNOSIS — R350 Frequency of micturition: Secondary | ICD-10-CM

## 2016-03-03 DIAGNOSIS — M545 Low back pain, unspecified: Secondary | ICD-10-CM

## 2016-03-03 LAB — POC URINALSYSI DIPSTICK (AUTOMATED)
BILIRUBIN UA: NEGATIVE
Glucose, UA: NEGATIVE
Ketones, UA: NEGATIVE
LEUKOCYTES UA: NEGATIVE
NITRITE UA: NEGATIVE
PH UA: 6
Protein, UA: NEGATIVE
RBC UA: NEGATIVE
Spec Grav, UA: 1.02
Urobilinogen, UA: NEGATIVE

## 2016-03-03 MED ORDER — PREDNISONE 10 MG PO TABS: ORAL_TABLET | ORAL | 0 refills | Status: DC

## 2016-03-03 NOTE — Telephone Encounter (Signed)
Patient here to see Pamala Hurry, NP for eval, scheduled by triage, see attached message.

## 2016-03-03 NOTE — Progress Notes (Signed)
Subjective:    Patient ID: [name], male    DOB: September 02, 1925, 82 y.o.   MRN: [id number]  HPI  Pt presents to the clinic today, brought in by his son, with c/o back pain and urinary frequency. This started 3-4 days ago. He reports the back pain starts in his bilateral lower back, will radiate down one leg, then down the other leg. He describes the pain as achy. He denies numbness and tingling. He denies loss of bowel or bladder. He denies dysuria, discharge or testicular pain. He reports he has had urinary frequency for years, this is not new or different. He has taken Tylenol for the back pain but it has not helped. He denies recent falls.  Review of Systems      Past Medical History:  Diagnosis Date  . Allergy   . Arrhythmia   . Arthritis   . Asthma   . Chicken pox   . Diverticulitis   . Emphysema of lung (HCC)   . Frequent headaches   . History of colon polyps   . Hypertension     Current Outpatient Prescriptions  Medication Sig Dispense Refill  . acetaminophen (TYLENOL) 325 MG tablet Take 650 mg by mouth every 6 (six) hours as needed for moderate pain.     Marland Kitchen albuterol (PROVENTIL HFA;VENTOLIN HFA) 108 (90 Base) MCG/ACT inhaler Inhale 2 puffs into the lungs every 6 (six) hours as needed for wheezing or shortness of breath. 1 Inhaler 5  . amiodarone (PACERONE) 200 MG tablet Take 1 tablet (200 mg total) by mouth 2 (two) times daily. 60 tablet 3  . aspirin 81 MG tablet Take 1 tablet (81 mg total) by mouth daily. 30 tablet 11  . fluticasone furoate-vilanterol (BREO ELLIPTA) 100-25 MCG/INH AEPB Inhale 1 puff into the lungs daily. 60 each 5  . furosemide (LASIX) 40 MG tablet Take 1 tablet (40 mg total) by mouth daily. 30 tablet 11  . tiotropium (SPIRIVA HANDIHALER) 18 MCG inhalation capsule Place 1 capsule (18 mcg total) into inhaler and inhale daily. 30 capsule 5  . predniSONE (DELTASONE) 10 MG tablet Take 3 tabs on days 1-2, take 2 tabs on days 3-4, take 1 tab on days 5-6  12 tablet 0   No current facility-administered medications for this visit.     No Known Allergies  Family History  Problem Relation Age of Onset  . Arthritis Mother   . Hypertension Mother   . Hyperlipidemia Father   . Hypertension Father   . Alcohol abuse Brother   . Cancer Brother     Lung    Social History   Social History  . Marital status: Married    Spouse name: N/A  . Number of children: 2  . Years of education: N/A   Occupational History  . 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 Smoking status: Former Smoker    Packs/day: 0.25    Years: 60.00    Types: Cigarettes    Quit date: 08/05/2014  . Smokeless tobacco: Former Neurosurgeon     Comment: quit x 1 month  . Alcohol use No  . Drug use: No  . Sexual activity: Not Currently   Other Topics Concern  . Not on file   Social History Narrative   Has living will   Wife, then children, should be health care POA. They are formalizing this.   Would accept resuscitation  Not sure about tube feedings      Constitutional: Denies fever, malaise, fatigue, headache or abrupt weight changes.  Gastrointestinal: Denies abdominal pain, bloating, constipation, diarrhea or blood in the stool.  GU: Pt reports urinary frequency. Denies urgency, pain with urination, burning sensation, blood in urine, odor or discharge. Musculoskeletal: Pt reports back pain. Denies decrease in range of motion, difficulty with gait, or joint pain and swelling.   No other specific complaints in a complete review of systems (except as listed in HPI above).  Objective:   Physical Exam   BP 128/76   Pulse 73   Temp 98 F (36.7 C) (Oral)   Wt 192 lb 8 oz (87.3 kg)   SpO2 94%   BMI 25.40 kg/m  Wt Readings from Last 3 Encounters:  03/03/16 192 lb 8 oz (87.3 kg)  02/18/16 196 lb (88.9 kg)  02/11/16 173 lb (78.5 kg)    General: Appears his stated age, chronically ill appearing, in  NAD. Abdomen: Soft and nontender. No CVA tenderness noted. Musculoskeletal: Decreased ROM of the spine. No bony tenderness noted over the spine. No pain with palpation of the paraspinal muscles. Foot drop noted of right foot. Walking stooped over with assistance of the walker. Neurological: Alert and oriented. Hard of hearing.  BMET    Component Value Date/Time   NA 134 (L) 12/07/2015 1153   NA 138 10/22/2013 0506   K 4.2 12/07/2015 1153   K 4.0 10/22/2013 0506   CL 106 12/07/2015 1153   CL 104 10/22/2013 0506   CO2 24 12/07/2015 1153   CO2 30 10/22/2013 0506   GLUCOSE 96 12/07/2015 1153   GLUCOSE 96 10/22/2013 0506   BUN 18 12/07/2015 1153   BUN 15 10/22/2013 0506   CREATININE 1.12 12/07/2015 1153   CREATININE 0.79 10/22/2013 0506   CALCIUM 9.0 12/07/2015 1153   CALCIUM 8.9 10/22/2013 0506   GFRNONAA >60 09/27/2015 0606   GFRNONAA >60 10/22/2013 0506   GFRAA >60 09/27/2015 0606   GFRAA >60 10/22/2013 0506    Lipid Panel     Component Value Date/Time   CHOL 143 07/28/2015 1627   TRIG 154.0 (H) 07/28/2015 1627   HDL 39.30 07/28/2015 1627   CHOLHDL 4 07/28/2015 1627   VLDL 30.8 07/28/2015 1627   LDLCALC 73 07/28/2015 1627    CBC    Component Value Date/Time   WBC 9.9 12/07/2015 1153   RBC 4.54 12/07/2015 1153   HGB 14.1 12/07/2015 1153   HGB 13.9 10/22/2013 0506   HCT 41.8 12/07/2015 1153   HCT WILL FOLLOW 11/05/2015 1041   PLT 269.0 12/07/2015 1153   PLT WILL FOLLOW 11/05/2015 1041   MCV 92.0 12/07/2015 1153   MCV WILL FOLLOW 11/05/2015 1041   MCV 92 10/22/2013 0506   MCH WILL FOLLOW 11/05/2015 1041   MCH 31.5 09/27/2015 0606   MCHC 33.7 12/07/2015 1153   RDW 14.9 12/07/2015 1153   RDW WILL FOLLOW 11/05/2015 1041   RDW 14.5 10/22/2013 0506   LYMPHSABS 1.2 12/07/2015 1153   LYMPHSABS WILL FOLLOW 11/05/2015 1041   LYMPHSABS 2.2 10/22/2013 0506   MONOABS 1.2 (H) 12/07/2015 1153   MONOABS 0.7 10/22/2013 0506   EOSABS 0.1 12/07/2015 1153   EOSABS WILL  FOLLOW 11/05/2015 1041   EOSABS 0.2 10/22/2013 0506   BASOSABS 0.1 12/07/2015 1153   BASOSABS WILL FOLLOW 11/05/2015 1041   BASOSABS 0.1 10/22/2013 0506    Hgb A1C Lab Results  Component Value Date   HGBA1C  5.4 09/22/2015        Assessment & Plan:   Urinary frequency:  Urinalysis normal No intervention needed  Back pain:  Seems MSK in origin eRx for Prednisone x 6 days If pain persist or worsens, can consider further workup with xrays  RTC as needed or if symptoms persist or worsen BAITY, REGINA, NP

## 2016-03-03 NOTE — Patient Instructions (Signed)

## 2016-03-03 NOTE — Telephone Encounter (Signed)
Will await her evaluation

## 2016-03-03 NOTE — Telephone Encounter (Signed)
Patient Name: [name]  DOB: 04/16/1926    Initial Comment Caller says, father feels weak, has a back ache, but acts very negative which is unusual, concern is he may have a UTI.    Nurse Assessment  Nurse: Stefano Gaul, RN, Dwana Curd Date/Time (Eastern Time): 03/03/2016 11:38:23 AM  Confirm and document reason for call. If symptomatic, describe symptoms. You must click the next button to save text entered. ---Caller states father has back pain and he is argumentative which is not normal behavior for him. thinks he might have UTI. Urinating frequently. No fever.  Has the patient traveled out of the country within the last 30 days? ---Not Applicable  Does the patient have any new or worsening symptoms? ---Yes  Will a triage be completed? ---Yes  Related visit to physician within the last 2 weeks? ---No  Does the PT have any chronic conditions? (i.e. diabetes, asthma, etc.) ---No  Is this a behavioral health or substance abuse call? ---No     Guidelines    Guideline Title Affirmed Question Affirmed Notes  Urinary Symptoms Side (flank) or lower back pain present    Final Disposition User   See Physician within 24 Hours [place], RN, Dwana Curd    Comments  appt scheduled for 03/03/2016 at 1:15 pm with [facility].   Referrals  REFERRED TO PCP OFFICE   Disagree/Comply: Comply

## 2016-03-04 ENCOUNTER — Encounter: Payer: Self-pay | Admitting: Internal Medicine

## 2016-03-08 ENCOUNTER — Encounter: Payer: Self-pay | Admitting: Internal Medicine

## 2016-03-08 ENCOUNTER — Ambulatory Visit (INDEPENDENT_AMBULATORY_CARE_PROVIDER_SITE_OTHER): Payer: Medicare Other | Admitting: Internal Medicine

## 2016-03-08 VITALS — BP 110/70 | HR 80 | Temp 97.3°F | Wt 194.0 lb

## 2016-03-08 DIAGNOSIS — J438 Other emphysema: Secondary | ICD-10-CM

## 2016-03-08 DIAGNOSIS — I5042 Chronic combined systolic (congestive) and diastolic (congestive) heart failure: Secondary | ICD-10-CM

## 2016-03-08 DIAGNOSIS — I480 Paroxysmal atrial fibrillation: Secondary | ICD-10-CM

## 2016-03-08 DIAGNOSIS — Z23 Encounter for immunization: Secondary | ICD-10-CM | POA: Diagnosis not present

## 2016-03-08 DIAGNOSIS — N400 Enlarged prostate without lower urinary tract symptoms: Secondary | ICD-10-CM | POA: Diagnosis not present

## 2016-03-08 NOTE — Progress Notes (Signed)
Subjective:    Patient ID: [name], male    DOB: Oct 25, 1925, 82 y.o.   MRN: [id number]  HPI Here for follow up of chronic health conditions Daughter and wife are here  Urinary frequency is better No evidence of infection Nocturia x 2 in general  Weighs daily--- usually ~165# No SOB with his usual activities--does get DOE if he rushes around No chest pain Rare palpitations--usually if multiple activities (and then sits) Slight ankle edema--better with better shoes Sleeps in bed usually --- flat. No PND  Walks with walker Does own ADLs-- generally continent Daughter does shopping and helps wife with instrumental ADLs Aides daily since he discharged--- now just 12 hours daily (8-8PM)--they cut out the overnight care He won't allow them to help with his personal care  No regular cough  Current Outpatient Prescriptions on File Prior to Visit  Medication Sig Dispense Refill  . acetaminophen (TYLENOL) 325 MG tablet Take 650 mg by mouth every 6 (six) hours as needed for moderate pain.     Marland Kitchen. albuterol (PROVENTIL HFA;VENTOLIN HFA) 108 (90 Base) MCG/ACT inhaler Inhale 2 puffs into the lungs every 6 (six) hours as needed for wheezing or shortness of breath. 1 Inhaler 5  . amiodarone (PACERONE) 200 MG tablet Take 1 tablet (200 mg total) by mouth 2 (two) times daily. 60 tablet 3  . aspirin 81 MG tablet Take 1 tablet (81 mg total) by mouth daily. 30 tablet 11  . fluticasone furoate-vilanterol (BREO ELLIPTA) 100-25 MCG/INH AEPB Inhale 1 puff into the lungs daily. 60 each 5  . furosemide (LASIX) 40 MG tablet Take 1 tablet (40 mg total) by mouth daily. 30 tablet 11  . predniSONE (DELTASONE) 10 MG tablet Take 3 tabs on days 1-2, take 2 tabs on days 3-4, take 1 tab on days 5-6 12 tablet 0  . tiotropium (SPIRIVA HANDIHALER) 18 MCG inhalation capsule Place 1 capsule (18 mcg total) into inhaler and inhale daily. 30 capsule 5   No current facility-administered medications on file prior to  visit.     No Known Allergies  Past Medical History:  Diagnosis Date  . Allergy   . Arrhythmia   . Arthritis   . Asthma   . Chicken pox   . Diverticulitis   . Emphysema of lung (HCC)   . Frequent headaches   . History of colon polyps   . Hypertension     Past Surgical History:  Procedure Laterality Date  . APPENDECTOMY    . BACK SURGERY  ~2011   Lumbar fusion  . TONSILLECTOMY      Family History  Problem Relation Age of Onset  . Arthritis Mother   . Hypertension Mother   . Hyperlipidemia Father   . Hypertension Father   . Alcohol abuse Brother   . Cancer Brother     Lung    Social History   Social History  . Marital status: Married    Spouse name: N/A  . Number of children: 2  . Years of education: N/A   Occupational History  . 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 Smoking status: Former Smoker    Packs/day: 0.25    Years: 60.00    Types: Cigarettes    Quit date: 08/05/2014  . Smokeless tobacco: Former NeurosurgeonUser     Comment: quit x 1 month  . Alcohol use No  . Drug use: No  . Sexual  activity: Not Currently   Other Topics Concern  . Not on file   Social History Narrative   Has living will   Wife, then children, should be health care POA. They are formalizing this.   Would accept resuscitation   Not sure about tube feedings    Review of Systems  Sleeps well Appetite is good Memory seems to be fine Chronic hearing loss Discussed daughter's concern about the gas stove (wife has left it on. She still does the bills though--with daughter's help)    Objective:   Physical Exam  Constitutional: He appears well-developed and well-nourished. No distress.  Neck: No thyromegaly present.  Cardiovascular: Normal rate, regular rhythm and normal heart sounds.  Exam reveals no gallop.   No murmur heard. Pulmonary/Chest: Effort normal. No respiratory distress. He has no wheezes. He has no rales.    Decreased breath sounds but clear  Musculoskeletal: He exhibits no edema.  Lymphadenopathy:    He has no cervical adenopathy.          Assessment & Plan:

## 2016-03-08 NOTE — Assessment & Plan Note (Signed)
No major symptoms now Heart sounds distant with barrel chest

## 2016-03-08 NOTE — Assessment & Plan Note (Signed)
Has been fluid neutral Stable mild DOE Remains fairly independent--- will cut back on aide time again

## 2016-03-08 NOTE — Progress Notes (Signed)
Pre visit review using our clinic review tool, if applicable. No additional management support is needed unless otherwise documented below in the visit note. 

## 2016-03-08 NOTE — Addendum Note (Signed)
Addended by: Eual Fines on: 03/08/2016 02:27 PM   Modules accepted: Orders

## 2016-03-08 NOTE — Assessment & Plan Note (Signed)
Regular ASA only

## 2016-03-08 NOTE — Assessment & Plan Note (Signed)
Voiding some better now

## 2016-03-25 ENCOUNTER — Other Ambulatory Visit: Payer: Self-pay | Admitting: Internal Medicine

## 2016-03-28 ENCOUNTER — Ambulatory Visit (INDEPENDENT_AMBULATORY_CARE_PROVIDER_SITE_OTHER): Payer: Medicare Other | Admitting: Internal Medicine

## 2016-03-28 ENCOUNTER — Encounter: Payer: Self-pay | Admitting: Internal Medicine

## 2016-03-28 VITALS — BP 120/64 | HR 69 | Ht 73.0 in | Wt 195.8 lb

## 2016-03-28 DIAGNOSIS — J438 Other emphysema: Secondary | ICD-10-CM | POA: Diagnosis not present

## 2016-03-28 MED ORDER — FLUTICASONE FUROATE-VILANTEROL 100-25 MCG/INH IN AEPB: 1.0000 | INHALATION_SPRAY | Freq: Every day | RESPIRATORY_TRACT | 0 refills | Status: AC

## 2016-03-28 NOTE — Patient Instructions (Signed)
Continue inhalers as prescribed Check ONO  Chronic Obstructive Pulmonary Disease Chronic obstructive pulmonary disease (COPD) is a common lung condition in which airflow from the lungs is limited. COPD is a general term that can be used to describe many different lung problems that limit airflow, including both chronic bronchitis and emphysema. If you have COPD, your lung function will probably never return to normal, but there are measures you can take to improve lung function and make yourself feel better. CAUSES   Smoking (common).  Exposure to secondhand smoke.  Genetic problems.  Chronic inflammatory lung diseases or recurrent infections. SYMPTOMS  Shortness of breath, especially with physical activity.  Deep, persistent (chronic) cough with a large amount of thick mucus.  Wheezing.  Rapid breaths (tachypnea).  Gray or bluish discoloration (cyanosis) of the skin, especially in your fingers, toes, or lips.  Fatigue.  Weight loss.  Frequent infections or episodes when breathing symptoms become much worse (exacerbations).  Chest tightness. DIAGNOSIS Your health care provider will take a medical history and perform a physical examination to diagnose COPD. Additional tests for COPD may include:  Lung (pulmonary) function tests.  Chest X-ray.  CT scan.  Blood tests. TREATMENT  Treatment for COPD may include:  Inhaler and nebulizer medicines. These help manage the symptoms of COPD and make your breathing more comfortable.  Supplemental oxygen. Supplemental oxygen is only helpful if you have a low oxygen level in your blood.  Exercise and physical activity. These are beneficial for nearly all people with COPD.  Lung surgery or transplant.  Nutrition therapy to gain weight, if you are underweight.  Pulmonary rehabilitation. This may involve working with a team of health care providers and specialists, such as respiratory, occupational, and physical  therapists. HOME CARE INSTRUCTIONS  Take all medicines (inhaled or pills) as directed by your health care provider.  Avoid over-the-counter medicines or cough syrups that dry up your airway (such as antihistamines) and slow down the elimination of secretions unless instructed otherwise by your health care provider.  If you are a smoker, the most important thing that you can do is stop smoking. Continuing to smoke will cause further lung damage and breathing trouble. Ask your health care provider for help with quitting smoking. He or she can direct you to community resources or hospitals that provide support.  Avoid exposure to irritants such as smoke, chemicals, and fumes that aggravate your breathing.  Use oxygen therapy and pulmonary rehabilitation if directed by your health care provider. If you require home oxygen therapy, ask your health care provider whether you should purchase a pulse oximeter to measure your oxygen level at home.  Avoid contact with individuals who have a contagious illness.  Avoid extreme temperature and humidity changes.  Eat healthy foods. Eating smaller, more frequent meals and resting before meals may help you maintain your strength.  Stay active, but balance activity with periods of rest. Exercise and physical activity will help you maintain your ability to do things you want to do.  Preventing infection and hospitalization is very important when you have COPD. Make sure to receive all the vaccines your health care provider recommends, especially the pneumococcal and influenza vaccines. Ask your health care provider whether you need a pneumonia vaccine.  Learn and use relaxation techniques to manage stress.  Learn and use controlled breathing techniques as directed by your health care provider. Controlled breathing techniques include:  Pursed lip breathing. Start by breathing in (inhaling) through your nose for 1 second. Then,  purse your lips as if you were  going to whistle and breathe out (exhale) through the pursed lips for 2 seconds.  Diaphragmatic breathing. Start by putting one hand on your abdomen just above your waist. Inhale slowly through your nose. The hand on your abdomen should move out. Then purse your lips and exhale slowly. You should be able to feel the hand on your abdomen moving in as you exhale.  Learn and use controlled coughing to clear mucus from your lungs. Controlled coughing is a series of short, progressive coughs. The steps of controlled coughing are: 1. Lean your head slightly forward. 2. Breathe in deeply using diaphragmatic breathing. 3. Try to hold your breath for 3 seconds. 4. Keep your mouth slightly open while coughing twice. 5. Spit any mucus out into a tissue. 6. Rest and repeat the steps once or twice as needed. SEEK MEDICAL CARE IF:  You are coughing up more mucus than usual.  There is a change in the color or thickness of your mucus.  Your breathing is more labored than usual.  Your breathing is faster than usual. SEEK IMMEDIATE MEDICAL CARE IF:  You have shortness of breath while you are resting.  You have shortness of breath that prevents you from:  Being able to talk.  Performing your usual physical activities.  You have chest pain lasting longer than 5 minutes.  Your skin color is more cyanotic than usual.  You measure low oxygen saturations for longer than 5 minutes with a pulse oximeter. MAKE SURE YOU:  Understand these instructions.  Will watch your condition.  Will get help right away if you are not doing well or get worse.   This information is not intended to replace advice given to you by your health care provider. Make sure you discuss any questions you have with your health care provider.   Document Released: 03/02/2005 Document Revised: 06/13/2014 Document Reviewed: 01/17/2013 Elsevier Interactive Patient Education [employer].

## 2016-03-28 NOTE — Progress Notes (Signed)
[facility]monary Medicine Consultation      Date: 03/28/2016,   MRN# [id number] Ezzard Caress 1925/12/31 Code Status:  Hosp day:@LENGTHOFSTAYDAYS @ Referring MD: @ATDPROV @     PCP:      AdmissionWeight: 195 lb 12.8 oz (88.8 kg)                 CurrentWeight: 195 lb 12.8 oz (88.8 kg) Nat Max Biernat is a 82 y.o. old male seen in consultation for COPD/SOB    CHIEF COMPLAINT:  SOB and DOE     HISTORY OF PRESENT ILLNESS  Last OV was 1 year ago for COPD, patient lost to follow up did NOT obtain PFT's or ONO Has chronic DOE and SOB that's stable on BREO 100, SPiriva, albuterol as needed Patient has been a heavy smoker in the past approx 60 pack year, patient quit in March 2016  has no signs of symptoms of pneumonia at this time, and has no other symptoms at this time  CXR reviewed shows flattened diaphragms and hyperinflation with severe kyphosis   No signs of infection at this time     MEDICATIONS     Current Medication:  Current Outpatient Prescriptions:  .  acetaminophen (TYLENOL) 325 MG tablet, Take 650 mg by mouth every 6 (six) hours as needed for moderate pain. , Disp: , Rfl:  .  albuterol (PROVENTIL HFA;VENTOLIN HFA) 108 (90 Base) MCG/ACT inhaler, Inhale 2 puffs into the lungs every 6 (six) hours as needed for wheezing or shortness of breath., Disp: 1 Inhaler, Rfl: 5 .  amiodarone (PACERONE) 200 MG tablet, take 1 tablet by mouth twice a day, Disp: 60 tablet, Rfl: 3 .  aspirin 81 MG tablet, Take 1 tablet (81 mg total) by mouth daily., Disp: 30 tablet, Rfl: 11 .  fluticasone furoate-vilanterol (BREO ELLIPTA) 100-25 MCG/INH AEPB, Inhale 1 puff into the lungs daily., Disp: 60 each, Rfl: 5 .  furosemide (LASIX) 40 MG tablet, Take 1 tablet (40 mg total) by mouth daily., Disp: 30 tablet, Rfl: 11 .  tiotropium (SPIRIVA HANDIHALER) 18 MCG inhalation capsule, Place 1 capsule (18 mcg total) into inhaler and inhale daily., Disp: 30 capsule, Rfl: 5    ALLERGIES    Review of patient's allergies indicates no known allergies.     REVIEW OF SYSTEMS   Review of Systems  Constitutional: Negative for chills, diaphoresis, fever, malaise/fatigue and weight loss.  Respiratory: Positive for shortness of breath. Negative for cough, hemoptysis, sputum production and wheezing.   Cardiovascular: Positive for orthopnea. Negative for chest pain, palpitations and leg swelling.  Gastrointestinal: Negative for abdominal pain, heartburn, nausea and vomiting.  Musculoskeletal: Positive for back pain. Negative for myalgias.  Skin: Negative for rash.  Neurological: Negative for dizziness, tingling, tremors, weakness and headaches.  Psychiatric/Behavioral: The patient is not nervous/anxious.      VS: BP 120/64 (BP Location: Left Arm, Cuff Size: Normal)   Pulse 69   Ht 6\' 1"  (1.854 m)   Wt 195 lb 12.8 oz (88.8 kg)   SpO2 93%   BMI 25.83 kg/m      PHYSICAL EXAM  Physical Exam  Constitutional: He is oriented to person, place, and time. He appears well-developed and well-nourished. No distress.  HENT:  Mouth/Throat: No oropharyngeal exudate.  Neck: Neck supple.  Cardiovascular: Normal rate, regular rhythm and normal heart sounds.   No murmur heard. Pulmonary/Chest: Effort normal and breath sounds normal. No stridor. No respiratory distress. He has no wheezes.  Musculoskeletal: Normal range of motion.  He exhibits no edema.  Kyphosis   Neurological: He is alert and oriented to person, place, and time.  Skin: Skin is warm. He is not diaphoretic.  Psychiatric: He has a normal mood and affect.            ASSESSMENT/PLAN   82 yo white male with chronic SOB/DOE from COPD Gold Stage C His COPD is stable at this time  1.continue spiriva 2.continue LABA/steroid(Breo-ellipta 100) 3.albuterol as needed 4.check Overnight Pulse oximtery   Follow up in 3 months for COPD assessment   The Patient requires high complexity decision making for assessment  and support, frequent evaluation and titration of therapies, application of advanced monitoring technologies and extensive interpretation of multiple databases.    Patient/Family are satisfied with Plan of action and management. All questions answered  Lucie LeatherKurian David Valkyrie Guardiola, M.D.  [facility]  Medical Director [facility] Medical Director [facility] Card[facility] Department

## 2016-04-04 ENCOUNTER — Telehealth: Payer: Self-pay | Admitting: Internal Medicine

## 2016-04-04 NOTE — Telephone Encounter (Signed)
pts daughter called because pt has had difficulty hearing and wants to discuss a referral for hearing loss.  She isn't sure how this works.  Can you please call her back to review.

## 2016-04-05 NOTE — Telephone Encounter (Signed)
Spoke to daughter. Revonda Standard advised me we use [facility] a lot. I gave [name] that information. Advised her to let us know if he needs a formal referral for Medicare

## 2016-04-07 ENCOUNTER — Telehealth: Payer: Self-pay

## 2016-04-07 NOTE — Telephone Encounter (Signed)
Noted  

## 2016-04-07 NOTE — Telephone Encounter (Signed)
Patient's wife called to report that they are having difficulty getting her husband to wear the oxygen saturation device overnight that was ordered.  She states that it keeps falling off and patient moves too much to wear it.  She, also, could not provide me with the name of the company that is providing this monitoring and thought someone from our office dropped it off for them.  Upon further investigation, I have determined from the documentation that overnight pulse oximetry was ordered by his pulmonologist, Dr. Belia Heman.  I do not see the name of the company but have referred patient's wife to call and discuss this with Dr. Clovis Fredrickson office for further instruction.  The company may want to have someone come out with a different, more secure finger device to obtain the necessary reading requested by pulmonology.  [name] thanks me for the information and will call back if needs anything further.

## 2016-04-19 DIAGNOSIS — H6062 Unspecified chronic otitis externa, left ear: Secondary | ICD-10-CM | POA: Diagnosis not present

## 2016-04-19 DIAGNOSIS — H6123 Impacted cerumen, bilateral: Secondary | ICD-10-CM | POA: Diagnosis not present

## 2016-05-19 ENCOUNTER — Ambulatory Visit: Payer: Medicare Other | Admitting: Family

## 2016-06-04 ENCOUNTER — Other Ambulatory Visit: Payer: Self-pay | Admitting: Internal Medicine

## 2016-06-07 ENCOUNTER — Telehealth: Payer: Self-pay

## 2016-06-07 NOTE — Telephone Encounter (Signed)
Discussed with wife and daughter Maximum dose is 650 at any one time

## 2016-06-07 NOTE — Telephone Encounter (Signed)
Wife and daughter were in for an OV today and wanted to clarify his Tylenol dosage. It say 325mg  take 650mg  every 6 hrs as needed. The pt was under the assumption that he could take 2 650mg  tablets. They are asking if he takes 2 of the 325mg  or can her take 2 650mg ?

## 2016-07-06 ENCOUNTER — Other Ambulatory Visit: Payer: Self-pay | Admitting: Internal Medicine

## 2016-07-25 ENCOUNTER — Ambulatory Visit (INDEPENDENT_AMBULATORY_CARE_PROVIDER_SITE_OTHER): Payer: Medicare Other | Admitting: Internal Medicine

## 2016-07-25 ENCOUNTER — Encounter: Payer: Self-pay | Admitting: Internal Medicine

## 2016-07-25 VITALS — BP 112/70 | HR 88 | Temp 97.8°F | Wt 194.0 lb

## 2016-07-25 DIAGNOSIS — J9611 Chronic respiratory failure with hypoxia: Secondary | ICD-10-CM | POA: Diagnosis not present

## 2016-07-25 NOTE — Progress Notes (Signed)
Pre visit review using our clinic review tool, if applicable. No additional management support is needed unless otherwise documented below in the visit note. 

## 2016-07-25 NOTE — Assessment & Plan Note (Signed)
Still resistant to using it all the time--which is my recommendation He gets easy DOE and legs feel weak with short walking--- and coincides with desaturation Will keep the oxygen and encourage him to use it more

## 2016-07-25 NOTE — Progress Notes (Signed)
Subjective:    Patient ID: [name], male    DOB: 18-Nov-1925, 83 y.o.   MRN: [id number]  HPI Here due to need to certify for oxygen With wife  Not using it all the  Feels okay when just sitting around Gets very SOB--fast breathing--when he walks at all Won't use it at night---despite my recommendations He will wear it until midnight--then he will take it off He feels it prevents him from sleeping--or at least notes it is out of his nose when he awakens  Mostly stays at home Has portable oxygen but won't use it Finds his "legs give out" when he goes out  Current Outpatient Prescriptions on File Prior to Visit  Medication Sig Dispense Refill  . acetaminophen (TYLENOL) 325 MG tablet Take 650 mg by mouth every 6 (six) hours as needed for moderate pain.     Marland Kitchen albuterol (PROVENTIL HFA;VENTOLIN HFA) 108 (90 Base) MCG/ACT inhaler Inhale 2 puffs into the lungs every 6 (six) hours as needed for wheezing or shortness of breath. 1 Inhaler 5  . amiodarone (PACERONE) 200 MG tablet take 1 tablet by mouth twice a day 60 tablet 3  . aspirin 81 MG tablet Take 1 tablet (81 mg total) by mouth daily. 30 tablet 11  . BREO ELLIPTA 100-25 MCG/INH AEPB take 1 inhalation by mouth once daily 60 each 5  . SPIRIVA HANDIHALER 18 MCG inhalation capsule place 1 capsule INTO INHALER AND INHALE DAILY 30 capsule 4   No current facility-administered medications on file prior to visit.     No Known Allergies  Past Medical History:  Diagnosis Date  . Allergy   . Arrhythmia   . Arthritis   . Asthma   . Chicken pox   . Diverticulitis   . Emphysema of lung (HCC)   . Frequent headaches   . History of colon polyps   . Hypertension     Past Surgical History:  Procedure Laterality Date  . APPENDECTOMY    . BACK SURGERY  ~2011   Lumbar fusion  . TONSILLECTOMY      Family History  Problem Relation Age of Onset  . Arthritis Mother   . Hypertension Mother   . Hyperlipidemia Father   .  Hypertension Father   . Alcohol abuse Brother   . Cancer Brother     Lung    Social History   Social History  . Marital status: Married    Spouse name: N/A  . Number of children: 2  . Years of education: N/A   Occupational History  . 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 Smoking status: Former Smoker    Packs/day: 0.25    Years: 60.00    Types: Cigarettes    Quit date: 08/05/2014  . Smokeless tobacco: Former Neurosurgeon     Comment: quit x 1 month  . Alcohol use No  . Drug use: No  . Sexual activity: Not Currently   Other Topics Concern  . Not on file   Social History Narrative   Has living will   Wife, then children, should be health care POA. They are formalizing this.   Would accept resuscitation   Not sure about tube feedings    Review of Systems Appetite is okay Weight is stable    Objective:   Physical Exam  Pulmonary/Chest: Effort normal. No respiratory distress. He has no wheezes. He has no rales.  Decreased breath sounds but clear          Assessment & Plan:

## 2016-09-13 ENCOUNTER — Encounter: Payer: Self-pay | Admitting: Internal Medicine

## 2016-09-13 ENCOUNTER — Ambulatory Visit (INDEPENDENT_AMBULATORY_CARE_PROVIDER_SITE_OTHER): Payer: Medicare Other | Admitting: Internal Medicine

## 2016-09-13 VITALS — BP 106/66 | HR 57 | Temp 97.6°F | Ht 70.0 in | Wt 196.0 lb

## 2016-09-13 DIAGNOSIS — Z23 Encounter for immunization: Secondary | ICD-10-CM | POA: Diagnosis not present

## 2016-09-13 DIAGNOSIS — Z7189 Other specified counseling: Secondary | ICD-10-CM

## 2016-09-13 DIAGNOSIS — R351 Nocturia: Secondary | ICD-10-CM | POA: Diagnosis not present

## 2016-09-13 DIAGNOSIS — Z0000 Encounter for general adult medical examination without abnormal findings: Secondary | ICD-10-CM | POA: Insufficient documentation

## 2016-09-13 DIAGNOSIS — I480 Paroxysmal atrial fibrillation: Secondary | ICD-10-CM

## 2016-09-13 DIAGNOSIS — I471 Supraventricular tachycardia, unspecified: Secondary | ICD-10-CM

## 2016-09-13 DIAGNOSIS — I5042 Chronic combined systolic (congestive) and diastolic (congestive) heart failure: Secondary | ICD-10-CM | POA: Diagnosis not present

## 2016-09-13 DIAGNOSIS — J432 Centrilobular emphysema: Secondary | ICD-10-CM | POA: Diagnosis not present

## 2016-09-13 DIAGNOSIS — J9611 Chronic respiratory failure with hypoxia: Secondary | ICD-10-CM

## 2016-09-13 DIAGNOSIS — N401 Enlarged prostate with lower urinary tract symptoms: Secondary | ICD-10-CM | POA: Diagnosis not present

## 2016-09-13 LAB — COMPREHENSIVE METABOLIC PANEL
ALK PHOS: 89 U/L (ref 39–117)
ALT: 18 U/L (ref 0–53)
AST: 25 U/L (ref 0–37)
Albumin: 3.6 g/dL (ref 3.5–5.2)
BUN: 20 mg/dL (ref 6–23)
CHLORIDE: 104 meq/L (ref 96–112)
CO2: 24 mEq/L (ref 19–32)
Calcium: 8.9 mg/dL (ref 8.4–10.5)
Creatinine, Ser: 1.41 mg/dL (ref 0.40–1.50)
GFR: 50.32 mL/min — AB (ref >60.00)
GLUCOSE: 106 mg/dL — AB (ref 70–99)
POTASSIUM: 4.1 meq/L (ref 3.5–5.1)
Sodium: 138 mEq/L (ref 135–145)
Total Bilirubin: 0.5 mg/dL (ref 0.2–1.2)
Total Protein: 6.4 g/dL (ref 6.0–8.3)

## 2016-09-13 LAB — CBC WITH DIFFERENTIAL/PLATELET
BASOS PCT: 1 % (ref 0.0–3.0)
Basophils Absolute: 0.1 10*3/uL (ref 0.0–0.1)
EOS PCT: 1.2 % (ref 0.0–5.0)
Eosinophils Absolute: 0.1 10*3/uL (ref 0.0–0.7)
HCT: 41.7 % (ref 39.0–52.0)
HEMOGLOBIN: 14.1 g/dL (ref 13.0–17.0)
LYMPHS ABS: 1.6 10*3/uL (ref 0.7–4.0)
Lymphocytes Relative: 17.8 % (ref 12.0–46.0)
MCHC: 33.9 g/dL (ref 30.0–36.0)
MCV: 94 fl (ref 78.0–100.0)
MONO ABS: 0.9 10*3/uL (ref 0.1–1.0)
MONOS PCT: 9.3 % (ref 3.0–12.0)
NEUTROS PCT: 70.7 % (ref 43.0–77.0)
Neutro Abs: 6.5 10*3/uL (ref 1.4–7.7)
Platelets: 225 10*3/uL (ref 150.0–400.0)
RBC: 4.43 Mil/uL (ref 4.22–5.81)
RDW: 14.3 % (ref 11.5–15.5)
WBC: 9.2 10*3/uL (ref 4.0–10.5)

## 2016-09-13 LAB — LIPID PANEL
CHOL/HDL RATIO: 4
CHOLESTEROL: 146 mg/dL (ref 0–200)
HDL: 39 mg/dL — ABNORMAL LOW (ref >39.00)
NonHDL: 106.54
Triglycerides: 219 mg/dL — ABNORMAL HIGH (ref 0.0–149.0)
VLDL: 43.8 mg/dL — AB (ref 0.0–40.0)

## 2016-09-13 LAB — LDL CHOLESTEROL, DIRECT: Direct LDL: 70 mg/dL

## 2016-09-13 LAB — T4, FREE: Free T4: 1.15 ng/dL (ref 0.60–1.60)

## 2016-09-13 MED ORDER — TAMSULOSIN HCL 0.4 MG PO CAPS: 0.4000 mg | ORAL_CAPSULE | Freq: Every day | ORAL | 11 refills | Status: DC

## 2016-09-13 NOTE — Progress Notes (Signed)
Pre visit review using our clinic review tool, if applicable. No additional management support is needed unless otherwise documented below in the visit note. 

## 2016-09-13 NOTE — Assessment & Plan Note (Signed)
Highly symptomatic  Will try tamsulosin

## 2016-09-13 NOTE — Addendum Note (Signed)
Addended by: Tillman Abide I on: 09/13/2016 12:39 PM   Modules accepted: Orders

## 2016-09-13 NOTE — Assessment & Plan Note (Signed)
No recent evidence of this On ASA only

## 2016-09-13 NOTE — Assessment & Plan Note (Signed)
Seems to be the biggest reason for his DOE Urged him to wear the oxygen regularly

## 2016-09-13 NOTE — Assessment & Plan Note (Signed)
I have personally reviewed the Medicare Annual Wellness questionnaire and have noted 1. The patient's medical and social history 2. Their use of alcohol, tobacco or illicit drugs 3. Their current medications and supplements 4. The patient's functional ability including ADL's, fall risks, home safety risks and hearing or visual             impairment. 5. Diet and physical activities 6. Evidence for depression or mood disorders  The patients weight, height, BMI and visual acuity have been recorded in the chart I have made referrals, counseling and provided education to the patient based review of the above and I have provided the pt with a written personalized care plan for preventive services.  I have provided you with a copy of your personalized plan for preventive services. Please take the time to review along with your updated medication list.  Will update pneumovax No cancer screening Highly dependent on family for help with ADLs

## 2016-09-13 NOTE — Progress Notes (Signed)
Subjective:    Patient ID: [name], male    DOB: 1925/10/22, 83 y.o.   MRN: [id number]  HPI Here with wife and daughter For Medicare wellness and follow up of chronic health conditions Reviewed form and advanced directives Reviewed other doctors---- Dr Belia Heman (pulm), Lovett Calender, Bing Neighbors NP (cardiology) No tobacco now No alcohol Not really able to exercise Vision is okay Poor hearing Has had 2 falls this year--no sig injury Walks in home with walker--tires very quickly Some memory problems  Felt fine yesterday but is hurting today Entire body from "toenails to head" All around legs and across trunk Does take tylenol regularly once or twice a day--but hasn't taken today  Disturbed by nocturia Also has frequency during the day also Drinks a lot of decaf tea and other fluids--but cuts it out in evening  Ongoing SOB with any activity Needs help with transfers (walker) Only does basin bath Dressing, bathroom with some standby Wife does the instrumental ADLs No regular cough Stopped the breo--may have been causing mouth burning. This didn't seem to help his breathing  No apparent heart trouble No palpitations No chest pain No swelling other than right foot (from past fracture) Tries to weigh but inconsistent--- weight is stable though  Gets down a little--due to his limitations  Current Outpatient Prescriptions on File Prior to Visit  Medication Sig Dispense Refill  . acetaminophen (TYLENOL) 325 MG tablet Take 650 mg by mouth every 6 (six) hours as needed for moderate pain.     Marland Kitchen albuterol (PROVENTIL HFA;VENTOLIN HFA) 108 (90 Base) MCG/ACT inhaler Inhale 2 puffs into the lungs every 6 (six) hours as needed for wheezing or shortness of breath. 1 Inhaler 5  . amiodarone (PACERONE) 200 MG tablet take 1 tablet by mouth twice a day 60 tablet 3  . aspirin 81 MG tablet Take 1 tablet (81 mg total) by mouth daily. 30 tablet 11  . SPIRIVA HANDIHALER 18 MCG inhalation  capsule place 1 capsule INTO INHALER AND INHALE DAILY 30 capsule 4   No current facility-administered medications on file prior to visit.     No Known Allergies  Past Medical History:  Diagnosis Date  . Allergy   . Arrhythmia   . Arthritis   . Asthma   . Chicken pox   . Diverticulitis   . Emphysema of lung (HCC)   . Frequent headaches   . History of colon polyps   . Hypertension     Past Surgical History:  Procedure Laterality Date  . APPENDECTOMY    . BACK SURGERY  ~2011   Lumbar fusion  . TONSILLECTOMY      Family History  Problem Relation Age of Onset  . Arthritis Mother   . Hypertension Mother   . Hyperlipidemia Father   . Hypertension Father   . Alcohol abuse Brother   . Cancer Brother     Lung    Social History   Social History  . Marital status: Married    Spouse name: N/A  . Number of children: 2  . Years of education: N/A   Occupational History  . 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 Smoking status: Former Smoker    Packs/day: 0.25    Years: 60.00    Types: Cigarettes    Quit date: 08/05/2014  . Smokeless tobacco: Former Neurosurgeon     Comment: quit x 1 month  . Alcohol use  No  . Drug use: No  . Sexual activity: Not Currently   Other Topics Concern  . Not on file   Social History Narrative   Has living will   Wife, then children, should be health care POA. They are formalizing this.   Would accept resuscitation   Not sure about tube feedings    Review of Systems  Never sleeps well---fitful chronically. Frequent nocturia Appetite is good Wears seat belt. Doesn't drive Bowels are okay. No blood in stool No hematuria Not clear about joint pain No rash or suspicious lesions Has lost some teeth. Some mouth burning    Objective:   Physical Exam  Constitutional: No distress.  HENT:  Mouth/Throat: Oropharynx is clear and moist. No oropharyngeal exudate.  Neck: No thyromegaly  present.  Cardiovascular: Normal rate and regular rhythm.   No murmur heard. Feet are purple without palpable pulses  Pulmonary/Chest: He has no wheezes. He has no rales.  Very little air movement but not tight Dyspnea with just 2 steps  Abdominal: Soft. There is no tenderness.  Musculoskeletal:  1+ foot and ankle edema  Lymphadenopathy:    He has no cervical adenopathy.  Neurological: He is alert.  President -- "Trump, ?" 100-93-90 Won't try spelling backwards Recall 0/3 "April 1018"   Skin:  No foot ulcers  Psychiatric: He has a normal mood and affect. His behavior is normal.          Assessment & Plan:

## 2016-09-13 NOTE — Assessment & Plan Note (Signed)
Fluid status seems neutral Followed by cardiology

## 2016-09-13 NOTE — Assessment & Plan Note (Signed)
Severe Didn't tolerate the breo and it didn't seem to help

## 2016-09-13 NOTE — Assessment & Plan Note (Signed)
No apparent recurrence on the amiodarone

## 2016-09-13 NOTE — Addendum Note (Signed)
Addended by: Eual Fines on: 09/13/2016 12:51 PM   Modules accepted: Orders

## 2016-09-13 NOTE — Assessment & Plan Note (Signed)
See social history 

## 2016-09-18 DIAGNOSIS — S41111A Laceration without foreign body of right upper arm, initial encounter: Secondary | ICD-10-CM | POA: Diagnosis not present

## 2016-09-19 ENCOUNTER — Encounter: Payer: Self-pay | Admitting: Internal Medicine

## 2016-09-19 MED ORDER — FUROSEMIDE 40 MG PO TABS: 40.0000 mg | ORAL_TABLET | Freq: Every day | ORAL | 3 refills | Status: DC | PRN

## 2016-09-19 NOTE — Telephone Encounter (Signed)
[name](DPR signed) left v/m requesting cb pt having swelling on both ankles and lower legs appear tight. Kenneth request cb [phone].

## 2016-09-29 ENCOUNTER — Telehealth: Payer: Self-pay | Admitting: Internal Medicine

## 2016-09-29 NOTE — Telephone Encounter (Signed)
Pt has appt 09/30/16 at 2pm with Pamala Hurry NP.

## 2016-09-29 NOTE — Telephone Encounter (Signed)
 [facility] Day - Client TELEPHONE ADVICE RECORD [facility]  Patient Name: MARQUIZE Willaims  DOB: [birth year]    Initial Comment caller states her husband is having leg swelling that is painful . Dr. Alphonsus Sias prescribed something but it hasn't helped .   Nurse Assessment  Nurse: Odis Luster, RN, Bjorn Loser Date/Time (Eastern Time): 09/29/2016 11:50:24 AM  Confirm and document reason for call. If symptomatic, describe symptoms. ---caller states her husband is having leg swelling that is painful . Dr. Alphonsus Sias prescribed something but it hasn't helped. Right ankle swollen worse than the left. Caller reports that patient is able to walk.  Does the patient have any new or worsening symptoms? ---Yes  Will a triage be completed? ---Yes  Related visit to physician within the last 2 weeks? ---Yes  Does the PT have any chronic conditions? (i.e. diabetes, asthma, etc.) ---Yes  List chronic conditions. ---hx of heart condition;  Is this a behavioral health or substance abuse call? ---No     Guidelines    Guideline Title Affirmed Question Affirmed Notes  Leg Swelling and Edema [1] Thigh, calf, or ankle swelling AND [2] bilateral AND [3] 1 side is more swollen    Final Disposition User   See Physician within 4 Hours (or PCP triage) Odis Luster, RN, Bjorn Loser    Comments  appt scheduled for tomorrow at the [facility] location for 2pm with Nicki Reaper, NFP. Caller voiced understanding. No appts available today and caller refused to go to [facility].   Referrals  REFERRED TO PCP OFFICE   Disagree/Comply: Comply

## 2016-09-30 ENCOUNTER — Ambulatory Visit: Payer: Self-pay | Admitting: Internal Medicine

## 2016-09-30 ENCOUNTER — Telehealth: Payer: Self-pay | Admitting: Internal Medicine

## 2016-09-30 NOTE — Telephone Encounter (Signed)
Yes, charge no show

## 2016-09-30 NOTE — Telephone Encounter (Signed)
Pt did not show up for their acute visit.  Do you want to charge No Show Fee? °

## 2016-09-30 NOTE — Progress Notes (Deleted)
Subjective:    Patient ID: [name], male    DOB: Oct 08, 1925, 83 y.o.   MRN: [id number]  HPI  Pt presents to the clinic today with c/o lower extremity edema. They noticed this about 10 days ago. The right seems worse than the left. It is causing pain and interferring with walking. He does have a history of CHF. Dr. Alphonsus Sias prescribed Lasix to be taken prn on 09/23/16, but they report it has not been helping.   Review of Systems      Past Medical History:  Diagnosis Date  . Allergy   . Arrhythmia   . Arthritis   . Asthma   . Chicken pox   . Diverticulitis   . Emphysema of lung (HCC)   . Frequent headaches   . History of colon polyps   . Hypertension     Current Outpatient Prescriptions  Medication Sig Dispense Refill  . acetaminophen (TYLENOL) 325 MG tablet Take 650 mg by mouth every 6 (six) hours as needed for moderate pain.     Marland Kitchen albuterol (PROVENTIL HFA;VENTOLIN HFA) 108 (90 Base) MCG/ACT inhaler Inhale 2 puffs into the lungs every 6 (six) hours as needed for wheezing or shortness of breath. 1 Inhaler 5  . amiodarone (PACERONE) 200 MG tablet take 1 tablet by mouth twice a day 60 tablet 3  . aspirin 81 MG tablet Take 1 tablet (81 mg total) by mouth daily. 30 tablet 11  . furosemide (LASIX) 40 MG tablet Take 1 tablet (40 mg total) by mouth daily as needed. 30 tablet 3  . SPIRIVA HANDIHALER 18 MCG inhalation capsule place 1 capsule INTO INHALER AND INHALE DAILY 30 capsule 4  . tamsulosin (FLOMAX) 0.4 MG CAPS capsule Take 1 capsule (0.4 mg total) by mouth daily. 30 capsule 11   No current facility-administered medications for this visit.     No Known Allergies  Family History  Problem Relation Age of Onset  . Arthritis Mother   . Hypertension Mother   . Hyperlipidemia Father   . Hypertension Father   . Alcohol abuse Brother   . Cancer Brother     Lung    Social History   Social History  . Marital status: Married    Spouse name: N/A  . Number of  children: 2  . Years of education: N/A   Occupational History  . 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 Smoking status: Former Smoker    Packs/day: 0.25    Years: 60.00    Types: Cigarettes    Quit date: 08/05/2014  . Smokeless tobacco: Former Neurosurgeon     Comment: quit x 1 month  . Alcohol use No  . Drug use: No  . Sexual activity: Not Currently   Other Topics Concern  . Not on file   Social History Narrative   Has living will   Wife, then children, should be health care POA.   Would accept resuscitation   Not sure about tube feedings      Constitutional: Denies fever, malaise, fatigue, headache or abrupt weight changes.  HEENT: Denies eye pain, eye redness, ear pain, ringing in the ears, wax buildup, runny nose, nasal congestion, bloody nose, or sore throat. Respiratory: Denies difficulty breathing, shortness of breath, cough or sputum production.   Cardiovascular: Denies chest pain, chest tightness, palpitations or swelling in the hands or feet.  Gastrointestinal: Denies abdominal pain, bloating, constipation, diarrhea  or blood in the stool.  GU: Denies urgency, frequency, pain with urination, burning sensation, blood in urine, odor or discharge. Musculoskeletal: Denies decrease in range of motion, difficulty with gait, muscle pain or joint pain and swelling.  Skin: Denies redness, rashes, lesions or ulcercations.  Neurological: Denies dizziness, difficulty with memory, difficulty with speech or problems with balance and coordination.  Psych: Denies anxiety, depression, SI/HI.  No other specific complaints in a complete review of systems (except as listed in HPI above).  Objective:   Physical Exam        Assessment & Plan:

## 2016-10-09 ENCOUNTER — Other Ambulatory Visit: Payer: Self-pay | Admitting: Internal Medicine

## 2016-10-09 DIAGNOSIS — J449 Chronic obstructive pulmonary disease, unspecified: Secondary | ICD-10-CM

## 2016-10-18 ENCOUNTER — Telehealth: Payer: Self-pay | Admitting: Internal Medicine

## 2016-10-18 NOTE — Telephone Encounter (Signed)
I spoke to [facility] and she scheduled appointment for patient on 10/19/16 at 12:15.

## 2016-10-18 NOTE — Telephone Encounter (Signed)
Patient's daughter,[name],called.  Patient's ankles,feet,and legs are swollen.  Patient can't put on his left shoe due to the swelling.  Can patient be seen this week by Dr.Letvak?  Please advise. [name] said a detailed message can be left on her voice mail.

## 2016-10-18 NOTE — Telephone Encounter (Signed)
Okay to add on tomorrow or THursday

## 2016-10-19 ENCOUNTER — Telehealth: Payer: Self-pay | Admitting: *Deleted

## 2016-10-19 ENCOUNTER — Ambulatory Visit: Payer: Medicare Other | Admitting: Internal Medicine

## 2016-10-19 NOTE — Telephone Encounter (Signed)
Patient's daughter [name] left a voicemail stating that patient had an appointment with you at 12:15 today and refused to get out of bed and come. Cherrie stated that she and her mom would have come to talk with you at that appointment, but did not want to leave the new caregiver dealing with him since she is new. Cherrie stated that patient is having problems with swelling in feet and legs and he is getting more agitated. Cherrie requested that Dr. Alphonsus Sias call her back to discuss her concerns.

## 2016-10-19 NOTE — Telephone Encounter (Signed)
Spoke to her Concerned about the swelling in feet and ankles This goes back several days---- but he was aggravated today and unwilling to come in for the appt She describes him as belligerent this morning No SOB  Mostly in left foot--might be related to a twist a few weeks ago  Discussed trying to weigh him daily to monitor weight Could consider home visit if he is not doing well but refuses to come in

## 2016-11-03 ENCOUNTER — Encounter: Payer: Self-pay | Admitting: Internal Medicine

## 2016-11-03 ENCOUNTER — Ambulatory Visit (INDEPENDENT_AMBULATORY_CARE_PROVIDER_SITE_OTHER): Payer: Medicare Other | Admitting: Internal Medicine

## 2016-11-03 VITALS — BP 80/44 | HR 63 | Ht 73.0 in | Wt 189.0 lb

## 2016-11-03 DIAGNOSIS — I428 Other cardiomyopathies: Secondary | ICD-10-CM | POA: Diagnosis not present

## 2016-11-03 DIAGNOSIS — I471 Supraventricular tachycardia: Secondary | ICD-10-CM | POA: Diagnosis not present

## 2016-11-03 DIAGNOSIS — I5022 Chronic systolic (congestive) heart failure: Secondary | ICD-10-CM | POA: Diagnosis not present

## 2016-11-03 DIAGNOSIS — R001 Bradycardia, unspecified: Secondary | ICD-10-CM | POA: Diagnosis not present

## 2016-11-03 DIAGNOSIS — Z79899 Other long term (current) drug therapy: Secondary | ICD-10-CM

## 2016-11-03 MED ORDER — AMIODARONE HCL 200 MG PO TABS: 200.0000 mg | ORAL_TABLET | Freq: Every day | ORAL | Status: DC

## 2016-11-03 NOTE — Patient Instructions (Signed)
Medication Instructions: - Your physician has recommended you make the following change in your medication:  1) Decrease amiodarone 200 mg- take one tablet by mouth once daily  Labwork: - Your physician recommends that you have lab work today: TSH  Procedures/Testing: - none ordered  Follow-Up: - Your physician wants you to follow-up in: 6 months with Dr. Graciela Husbands. You will receive a reminder letter in the mail two months in advance. If you don't receive a letter, please call our office to schedule the follow-up appointment.   Any Additional Special Instructions Will Be Listed Below (If Applicable).     If you need a refill on your cardiac medications before your next appointment, please call your pharmacy.

## 2016-11-03 NOTE — Progress Notes (Signed)
Patient Care Team: Karie Schwalbe, MD as PCP - General (Internal Medicine) Duke Salvia, MD as Consulting Physician (Cardiology) Erin Fulling, MD as Consulting Physician (Pulmonary Disease) Delma Freeze, FNP as Nurse Practitioner (Family Medicine) Antonieta Iba, MD as Consulting Physician (Cardiology)   HPI  [name] is a 83 y.o. male Seen in follow-up for SVT. Included adenosine sensitive tachycardia as well as apparently atrial fibrillation although the only strips available and demonstrated PACs.   DATE PR interval QRSduration Dose-  *12 */16 -- 94 0  1/17 -- 94 0  6/17 208 104 AMIO  9/17 23 138 AMIO  5/18 236 138 AMIO         Date TSH LFTs  6/17  1.72 14  4/18    18      Records and Results Reviewed echocardiogram 2/16 [facility] normal LV function;  Repeat echocardiogram 4/17 demonstrated EF 25-30%;  he was started on amiodarone having previously declined to take flecainide in the context of what had been normal LV function  He has continued to have edema. It is more asymmetric now. He is back on furosemide.  He denies lightheadedness. He has had some weakness today.  He has had no interval palpitations.  Patient denies symptoms of GI intolerance, sun sensitivity, neurological symptoms attributable to amiodarone.   Chest CT scan evidence of emphysema  Past Medical History:  Diagnosis Date  . Allergy   . Arrhythmia   . Arthritis   . Asthma   . Chicken pox   . Diverticulitis   . Emphysema of lung (HCC)   . Frequent headaches   . History of colon polyps   . Hypertension     Past Surgical History:  Procedure Laterality Date  . APPENDECTOMY    . BACK SURGERY  ~2011   Lumbar fusion  . TONSILLECTOMY      Current Outpatient Prescriptions  Medication Sig Dispense Refill  . acetaminophen (TYLENOL) 325 MG tablet Take 650 mg by mouth every 6 (six) hours as needed for moderate pain.     Marland Kitchen amiodarone (PACERONE) 200 MG tablet take 1 tablet  by mouth twice a day 60 tablet 3  . aspirin 81 MG tablet Take 1 tablet (81 mg total) by mouth daily. 30 tablet 11  . furosemide (LASIX) 40 MG tablet Take 1 tablet (40 mg total) by mouth daily as needed. 30 tablet 3  . PROAIR HFA 108 (90 Base) MCG/ACT inhaler inhale 2 puffs INTO THE LUNGS every 6 hours if needed for wheezing or shortness of breath 8.5 g 5  . SPIRIVA HANDIHALER 18 MCG inhalation capsule inhale the contents of one capsule in the handihaler once daily 30 capsule 11  . tamsulosin (FLOMAX) 0.4 MG CAPS capsule Take 1 capsule (0.4 mg total) by mouth daily. 30 capsule 11   No current facility-administered medications for this visit.     No Known Allergies    Review of Systems negative except from HPI and PMH  Physical Exam BP (!) 80/44 (BP Location: Left Arm, Patient Position: Sitting, Cuff Size: Normal)   Pulse 63   Ht 6\' 1"  (1.854 m)   Wt 189 lb (85.7 kg)   BMI 24.94 kg/m   right arm blood pressure 94 Well developed and nourished in no acute distress HENT normal Neck supple with JVP-flat Carotids brisk and full without bruits Clear Regular rate and rhythm, no murmurs or gallops Abd-soft with active BS without hepatomegaly No Clubbing  cyanosis edema Skin-warm and dry A & Oriented  Grossly normal sensory and motor function  ECG demonstrates sinus rhythm at 63 Intervals 24/14/49 Branch block Unchanged  Assessment and  Plan  SVT-incessant  Sinus bradycardia  COPD/emphysema  High risk medication surveillance  NICM  CHF chronic systolic   RBBB/Conduction system disease    SVT is controlled by amiodarone. He seems to be tolerating amiodarone. We will decrease the dose from 400--200 mg daily. We will check a TSH for amiodarone surveillance.  He has asymmetric edema. I suggested venous Dopplers. He is declined. There is also possibility of pelvic obstruction. He would like to see how it goes. He will continue his diuretics.  LV function is not then  reassess. His blood pressure would preclude any further further pharmacological therapy.  His conduction system disease is stable on amiodarone. Sinus bradycardia has not been aggravated. She  We spent more than 50% of our >25 min visit in face to face counseling regarding the above

## 2016-11-04 LAB — TSH: TSH: 1.7 u[IU]/mL (ref 0.450–4.500)

## 2016-11-08 ENCOUNTER — Other Ambulatory Visit: Payer: Self-pay | Admitting: Internal Medicine

## 2016-12-02 ENCOUNTER — Other Ambulatory Visit: Payer: Self-pay | Admitting: Internal Medicine

## 2016-12-08 IMAGING — DX DG CHEST 2 VIEW
2 series · 2 of 2 positions shown · non-contrast
Comparison: November 26, 2007

CLINICAL DATA: Weakness for a week. History of hypertension and
emphysema.

EXAM:
CHEST  2 VIEW

[chest pa]
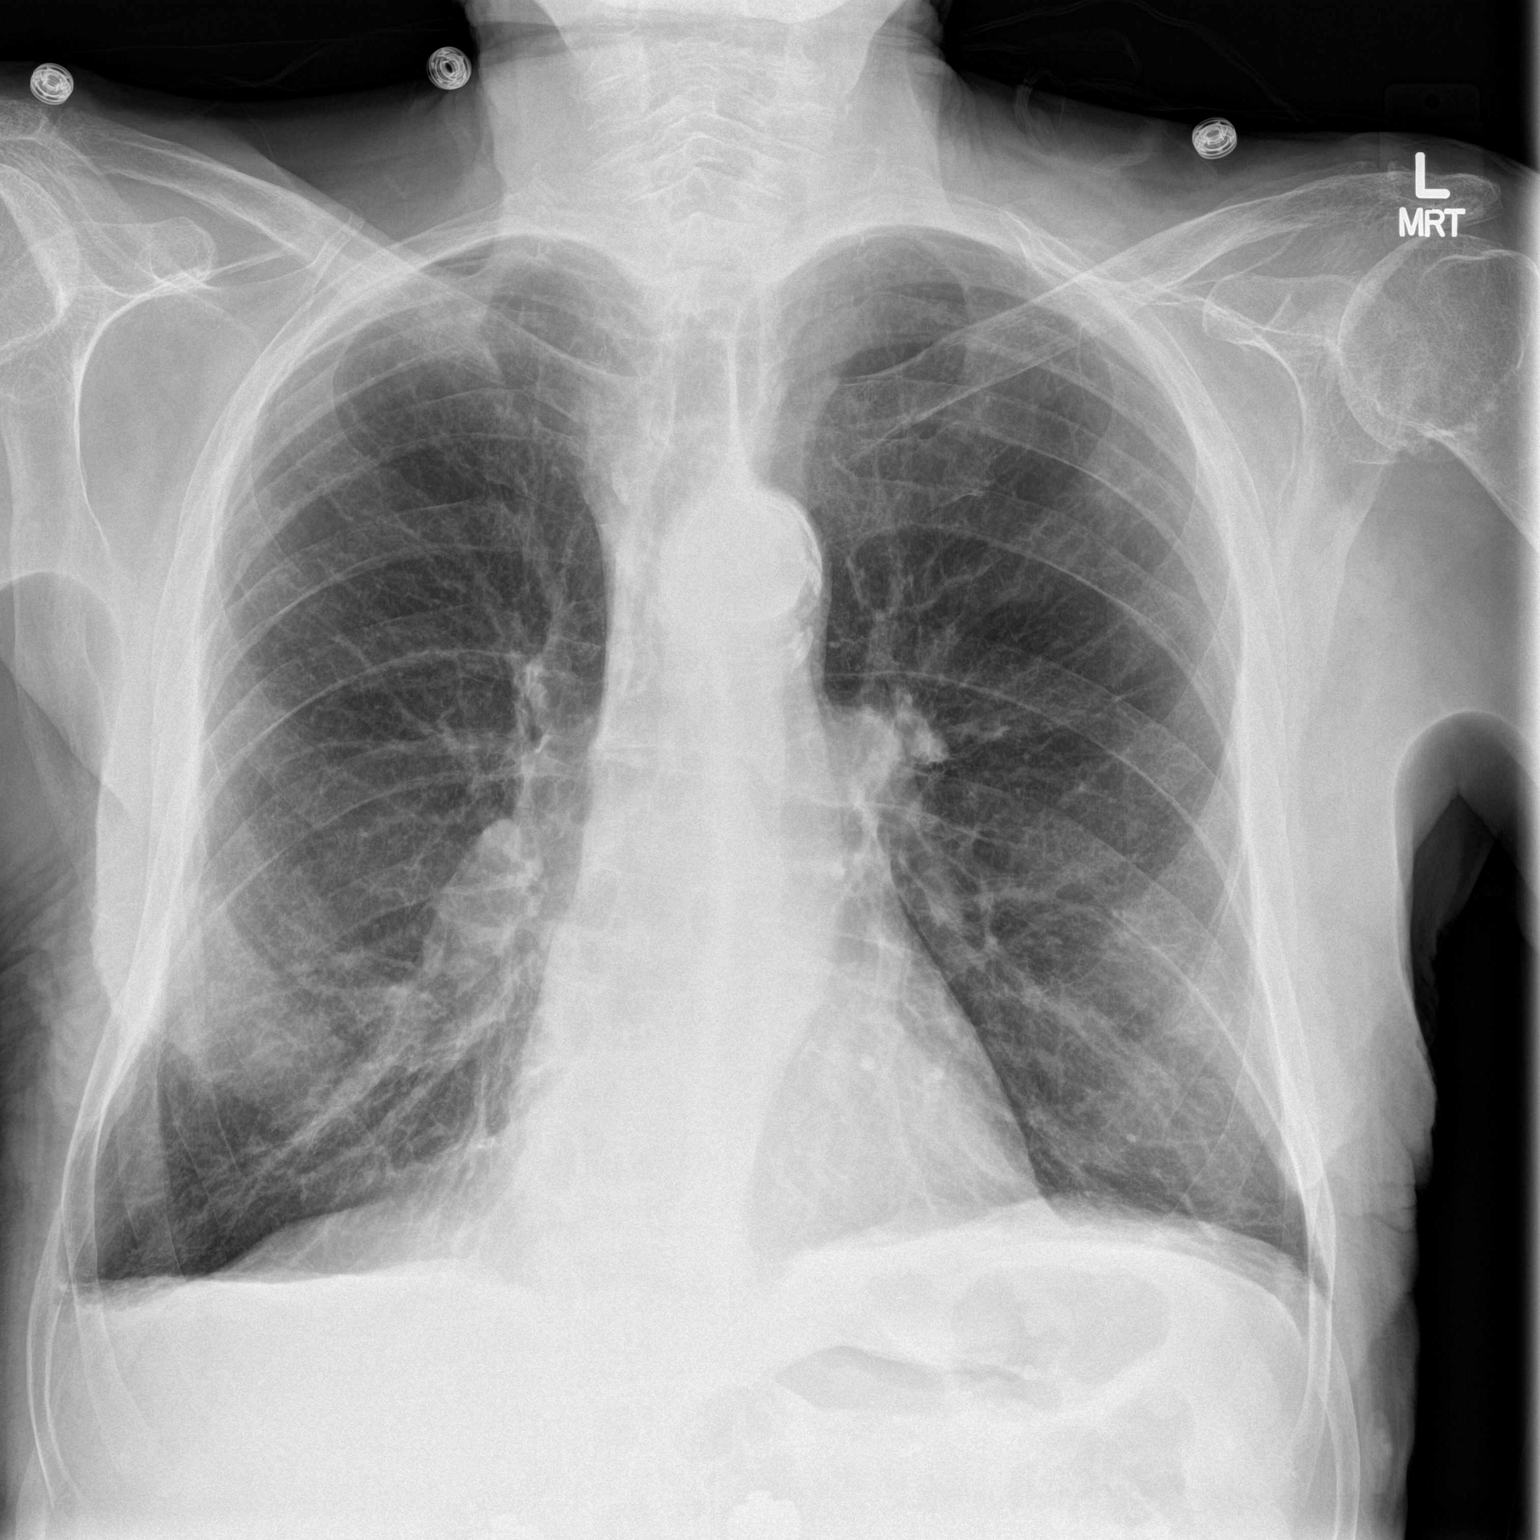

[chest lat]
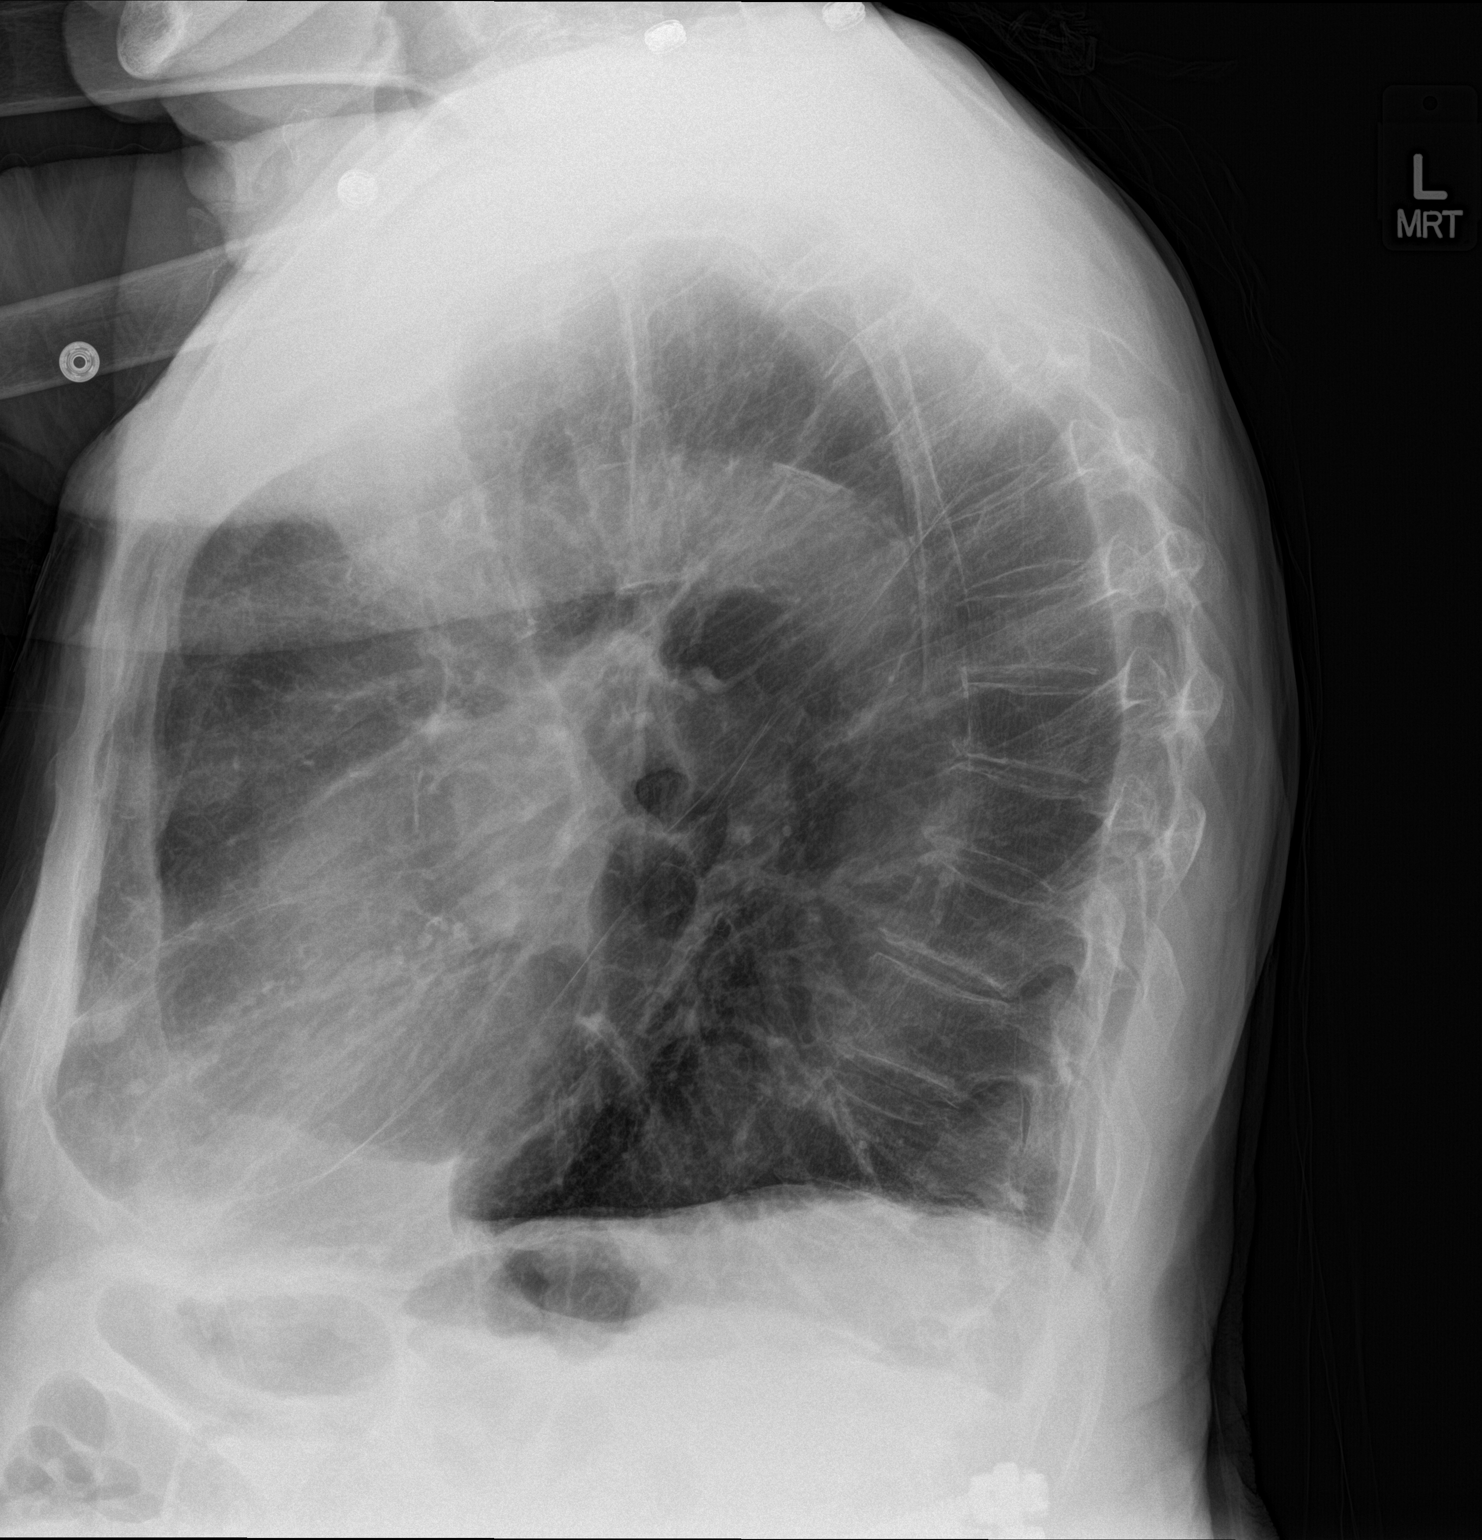

[2 of 2 positions shown; findings below may reference images not displayed]

FINDINGS: The heart size and mediastinal contours are within normal limits.
There is enlargement of pulmonary arteries unchanged. There is no
focal infiltrate, pulmonary edema, or pleural effusion. The lungs
are hyperinflated. The visualized skeletal structures are stable.
IMPRESSION: No active cardiopulmonary disease.  Emphysema.

## 2016-12-29 ENCOUNTER — Telehealth: Payer: Self-pay

## 2016-12-29 NOTE — Telephone Encounter (Signed)
I don't see anything in relation to this in patients chart either. Would they be willing to continue with [facility] service until Dr. Alphonsus Sias returns and offer advice before we just stop services?

## 2016-12-29 NOTE — Telephone Encounter (Signed)
[name] (DPR signed) left v/m; [name] said that her mom said that Dr Alphonsus Sias said that pt is doing so well no longer needs [facility] care service. Mrs Readinger has contacted [facility] and advised them to stop service per Dr Alphonsus Sias. [name] has not heard this before today. The service is to be cancelled 12/30/16 at 8 pm. [name] request cb; do not see note in pts chart re to this.Please advise.

## 2017-01-01 ENCOUNTER — Other Ambulatory Visit: Payer: Self-pay | Admitting: Internal Medicine

## 2017-01-01 NOTE — Telephone Encounter (Signed)
I certainly never said this! It sounds like something he would have said though--he has resisted help on an ongoing basis.

## 2017-01-02 NOTE — Telephone Encounter (Signed)
Spoke to [employer]. She said they have been without help since Saturday. Her mother gets so upset when she brings up keeping the help coming in. Would like to at least have them come in 2 days a week to help her with cleaning. She is keeping an eye out on them. Pt's wife stopped his [employer] because it was too expensive and did not help. Daughter is so upset. Hoped that we could talk to her to explain that this was needed. Said her mother seems to listen to other people better than her.

## 2017-01-02 NOTE — Telephone Encounter (Signed)
Spoke to pt's wife and let her know that Dr Alphonsus Sias thought it was best to have some help. She said she did not think he was needing as much help as he used to. She does not leave him alone. She will talk to Max and see what he thinks.

## 2017-01-02 NOTE — Telephone Encounter (Signed)
Left message for [name] to call back 

## 2017-01-02 NOTE — Telephone Encounter (Signed)
It would be okay to call them and let them know that I think it would be best if they kept some help at home (but this has been a long standing issue, especially with him)

## 2017-01-03 NOTE — Telephone Encounter (Signed)
Advised [name] that I had spoken to her Mom yesterday.

## 2017-01-03 NOTE — Telephone Encounter (Signed)
[name] called back, please return call at [phone]

## 2017-01-22 ENCOUNTER — Other Ambulatory Visit: Payer: Self-pay | Admitting: Internal Medicine

## 2017-02-17 ENCOUNTER — Telehealth: Payer: Self-pay | Admitting: Internal Medicine

## 2017-02-17 NOTE — Telephone Encounter (Signed)
°  Name: [name] Relationship to patient:Daughter Best contact number: Pharmacy:   Reason for call:   Reason for call: Daughter dropped off letter of request for family guardianship affidavits. Please call with any questions. Placing in RX tower.  Marland Kitchen..

## 2017-02-20 NOTE — Telephone Encounter (Signed)
PPW is not in the tower. Have you seen it Dr. Alphonsus Sias?

## 2017-02-20 NOTE — Telephone Encounter (Signed)
Discussed with daughter I cannot sign affidavits based on visit from 5 months ago---and I suspect he doesn't qualify as incompetent and in need of guardian (though wife may) Discussed that they should try to have him voluntarily give up some financial control

## 2017-03-13 ENCOUNTER — Telehealth: Payer: Self-pay

## 2017-03-13 NOTE — Telephone Encounter (Signed)
I called back and spoke with lady and confirmed appt with Dr Alphonsus Sias 03/14/17 at 11 AM.

## 2017-03-13 NOTE — Telephone Encounter (Signed)
PLEASE NOTE: All timestamps contained within this report are represented as [place] Standard Time. CONFIDENTIALTY NOTICE: This fax transmission is intended only for the addressee. It contains information that is legally privileged, confidential or otherwise protected from use or disclosure. If you are not the intended recipient, you are strictly prohibited from reviewing, disclosing, copying using or disseminating any of this information or taking any action in reliance on or regarding this information. If you have received this fax in error, please notify us immediately by telephone so that we can arrange for its return to [place]. Phone: [phone], Toll-Free: [phone], Fax: [phone] Page: 1 of 1 Call Id: [id number] [facility] Night - Client Nonclinical Telephone Record [facility] Medical Call Center Client [facility] Night - Client Client Site [facility] - Night Physician [facility] - PHYSICIAN, Crissie Figures- MD Contact Type Call Who Is Calling Patient / Member / Family / Caregiver Caller Name Braylin Dewater Caller Phone Number [phone] Patient Name [name] Call Type Message Only Information Provided Reason for Call Request for General Office Information Initial Comment Caller states, one of her pt. has an appt at 11am and she has called to confirm it. Unable to get to the office. Additional Comment Call Closed By: Terrilee Files Transaction Date/Time: 03/11/2017 9:34:15 AM (ET)

## 2017-03-14 ENCOUNTER — Encounter: Payer: Self-pay | Admitting: Internal Medicine

## 2017-03-14 ENCOUNTER — Ambulatory Visit (INDEPENDENT_AMBULATORY_CARE_PROVIDER_SITE_OTHER): Payer: Medicare Other | Admitting: Internal Medicine

## 2017-03-14 VITALS — BP 118/60 | HR 76 | Temp 98.1°F | Wt 185.0 lb

## 2017-03-14 DIAGNOSIS — J432 Centrilobular emphysema: Secondary | ICD-10-CM | POA: Diagnosis not present

## 2017-03-14 DIAGNOSIS — R413 Other amnesia: Secondary | ICD-10-CM

## 2017-03-14 DIAGNOSIS — I471 Supraventricular tachycardia: Secondary | ICD-10-CM

## 2017-03-14 DIAGNOSIS — I5042 Chronic combined systolic (congestive) and diastolic (congestive) heart failure: Secondary | ICD-10-CM

## 2017-03-14 DIAGNOSIS — Z23 Encounter for immunization: Secondary | ICD-10-CM

## 2017-03-14 NOTE — Progress Notes (Signed)
Subjective:    Patient ID: [name], male    DOB: [birth year], 83 y.o.   MRN: [id number]  HPI Here with wife and daughter for follow up of chronic health conditions  "I'm here"  Walks with walker--not happy about that Gets dressed himself Only does bed bath--does himself Some urgency but not incontinent Hard getting out of the house Daughter does the shopping and wife does the instrumental ADLs They stopped the help at home in July---discussed that this should be restarted He does the medications himself---he is fairly organized about this Wife does the bills--daughter is keeping an eye on this  Weight regularly Home is 10# less than here Some edema--not worsening  No chest pain No palpitations Amiodarone recently decreased--no symptoms since then No dizziness or syncope  DOE is stable--if he rushes in house even Some cough  Current Outpatient Prescriptions on File Prior to Visit  Medication Sig Dispense Refill  . acetaminophen (TYLENOL) 325 MG tablet Take 650 mg by mouth every 6 (six) hours as needed for moderate pain.     Marland Kitchen amiodarone (PACERONE) 200 MG tablet Take 1 tablet (200 mg total) by mouth daily. 30 tablet 3  . furosemide (LASIX) 40 MG tablet take 1 tablet by mouth once daily if needed 30 tablet 3  . PROAIR HFA 108 (90 Base) MCG/ACT inhaler inhale 2 puffs INTO THE LUNGS every 6 hours if needed for wheezing or shortness of breath 8.5 g 5  . RA ASPIRIN EC 81 MG EC tablet take 1 tablet by mouth daily 30 tablet 11  . SPIRIVA HANDIHALER 18 MCG inhalation capsule inhale the contents of one capsule in the handihaler once daily 30 capsule 11  . tamsulosin (FLOMAX) 0.4 MG CAPS capsule Take 1 capsule (0.4 mg total) by mouth daily. 30 capsule 11   No current facility-administered medications on file prior to visit.     No Known Allergies  Past Medical History:  Diagnosis Date  . Allergy   . Arrhythmia   . Arthritis   . Asthma   . Chicken pox   .  Diverticulitis   . Emphysema of lung (HCC)   . Frequent headaches   . History of colon polyps   . Hypertension     Past Surgical History:  Procedure Laterality Date  . APPENDECTOMY    . BACK SURGERY  ~2011   Lumbar fusion  . TONSILLECTOMY      Family History  Problem Relation Age of Onset  . Arthritis Mother   . Hypertension Mother   . Hyperlipidemia Father   . Hypertension Father   . Alcohol abuse Brother   . Cancer Brother        Lung    Social History   Social History  . Marital status: Married    Spouse name: N/A  . Number of children: 2  . Years of education: N/A   Occupational History  . 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 Smoking status: Former Smoker    Packs/day: 0.25    Years: 60.00    Types: Cigarettes    Quit date: 08/05/2014  . Smokeless tobacco: Former Neurosurgeon     Comment: quit x 1 month  . Alcohol use No  . Drug use: No  . Sexual activity: Not Currently   Other Topics Concern  . Not on file   Social History Narrative   Has living will  Wife, then children, should be health care POA.   Would accept resuscitation   Not sure about tube feedings    Review of Systems Bowels are okay Appetite is good Not a great sleeper--- nocturia every 2 hours, will eventually get back to sleep    Objective:   Physical Exam  Constitutional: No distress.  Neck: No thyromegaly present.  Cardiovascular: Normal rate, regular rhythm and normal heart sounds.  Exam reveals no gallop.   No murmur heard. Pulmonary/Chest:  Decreased breath sounds but clear  Musculoskeletal:  Slight ankle edema  Lymphadenopathy:    He has no cervical adenopathy.  Skin:  Actinics in left hairline. Raised papule ~66mm in front of left ear Suggested derm evaluation  Psychiatric:  Very strident about independence and doing for themselves          Assessment & Plan:

## 2017-03-14 NOTE — Assessment & Plan Note (Signed)
Fairly significant and limiting to him Discussed hiring help again in the house, etc

## 2017-03-14 NOTE — Assessment & Plan Note (Signed)
Doesn't seem to be progressive Still manages care and medications He and wife resistant to increased help

## 2017-03-14 NOTE — Assessment & Plan Note (Signed)
Controlled with amiodarone Dose just decreased

## 2017-03-14 NOTE — Assessment & Plan Note (Signed)
Compensated Has the furosemide-- but doesn't need it that often Weight down

## 2017-03-14 NOTE — Addendum Note (Signed)
Addended by: Eual Fines on: 03/14/2017 12:45 PM   Modules accepted: Orders

## 2017-04-09 ENCOUNTER — Other Ambulatory Visit: Payer: Self-pay | Admitting: Internal Medicine

## 2017-04-14 ENCOUNTER — Encounter: Payer: Self-pay | Admitting: Pulmonary Disease

## 2017-04-14 ENCOUNTER — Ambulatory Visit: Payer: Medicare Other | Admitting: Pulmonary Disease

## 2017-05-08 ENCOUNTER — Other Ambulatory Visit: Payer: Self-pay | Admitting: *Deleted

## 2017-05-08 MED ORDER — AMIODARONE HCL 200 MG PO TABS: 200.0000 mg | ORAL_TABLET | Freq: Every day | ORAL | 3 refills | Status: DC

## 2017-05-23 IMAGING — CR DG CHEST 2V
1 series · 2 of 2 positions shown · non-contrast
Comparison: Chest x-ray of 10/21/2013

CLINICAL DATA: Shortness of breath, history of supraventricular
tachycardia

EXAM:
CHEST  2 VIEW

[Series 1: dg chest 2 view · 0.14mm/px · 2 of 2 slices shown]
[im 1/2]
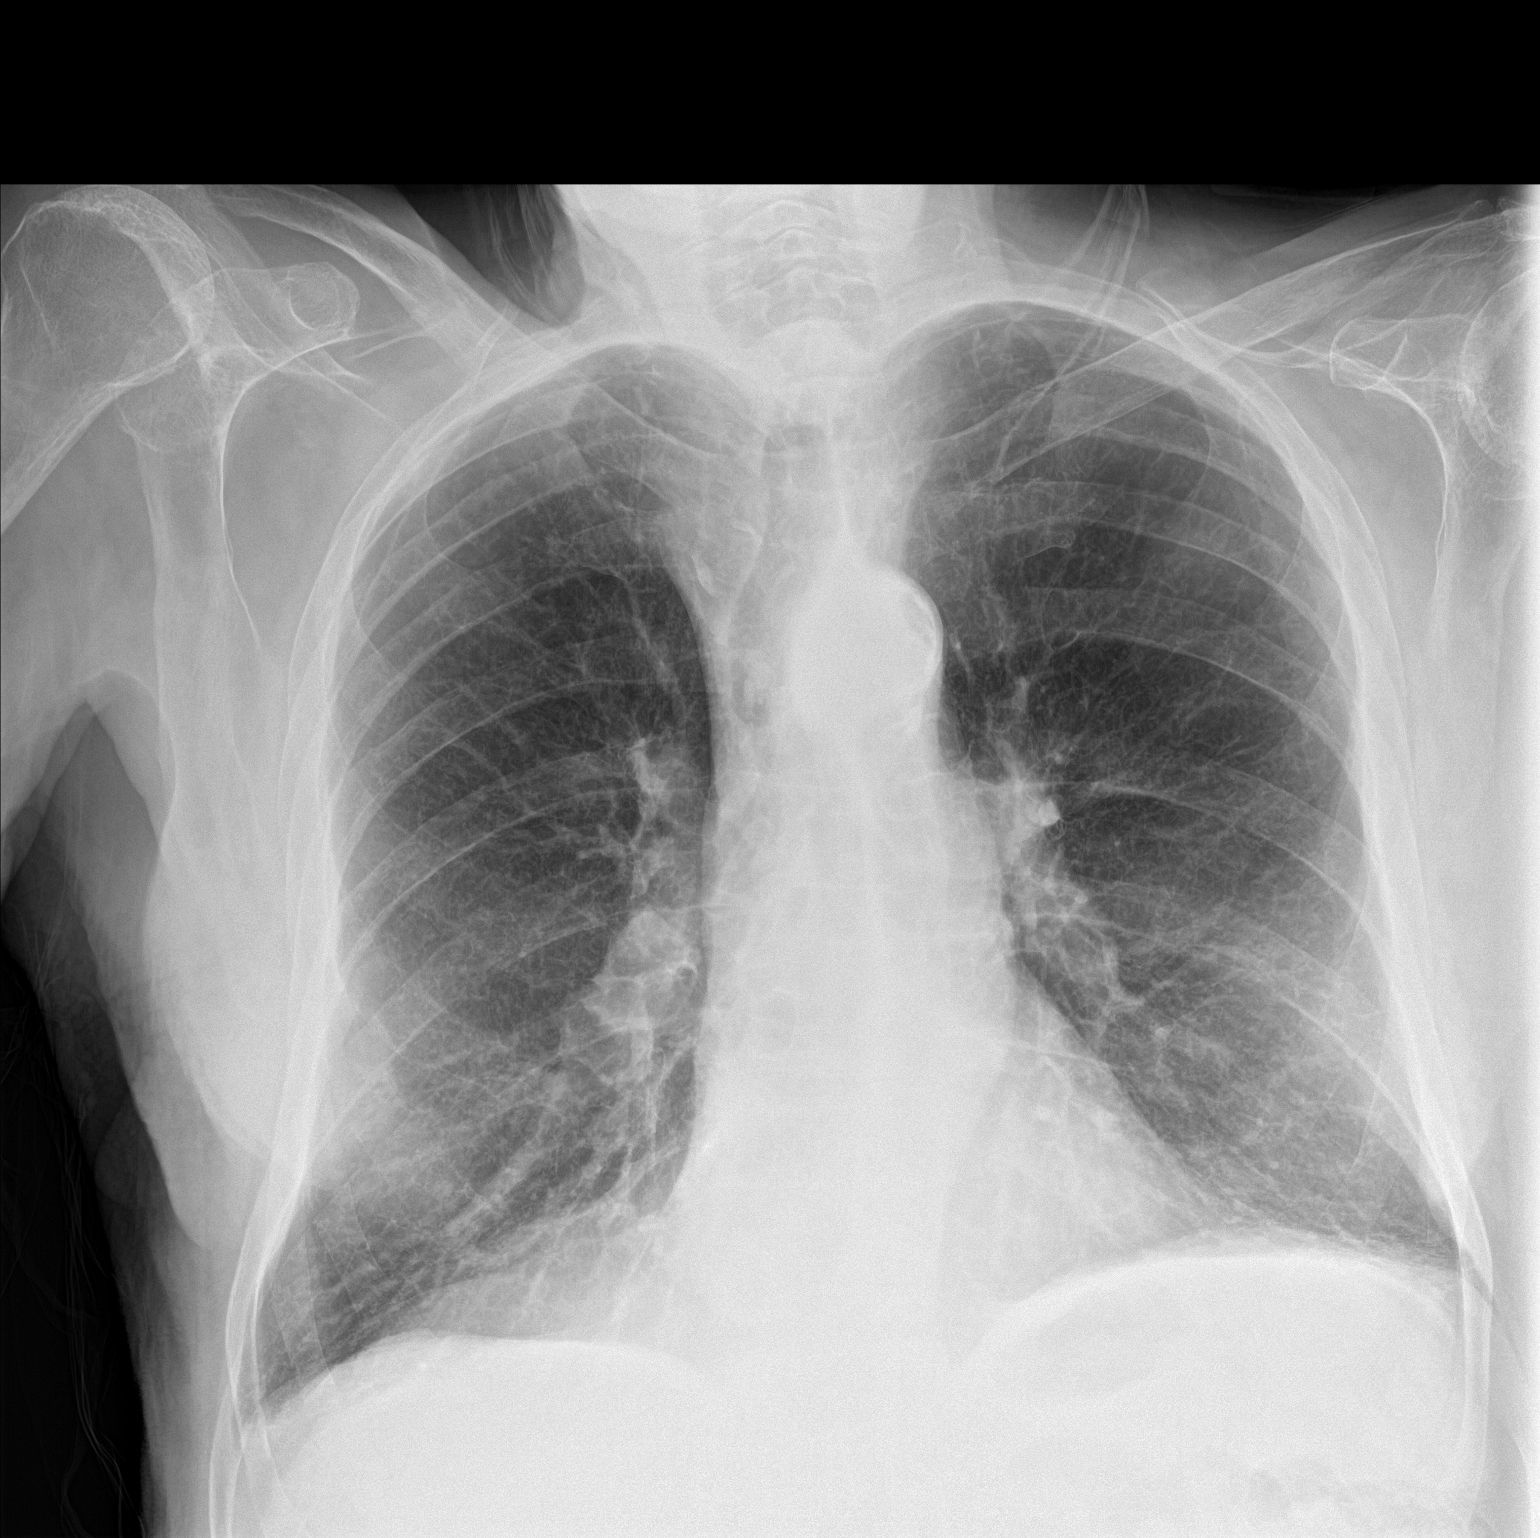
[im 2/2]
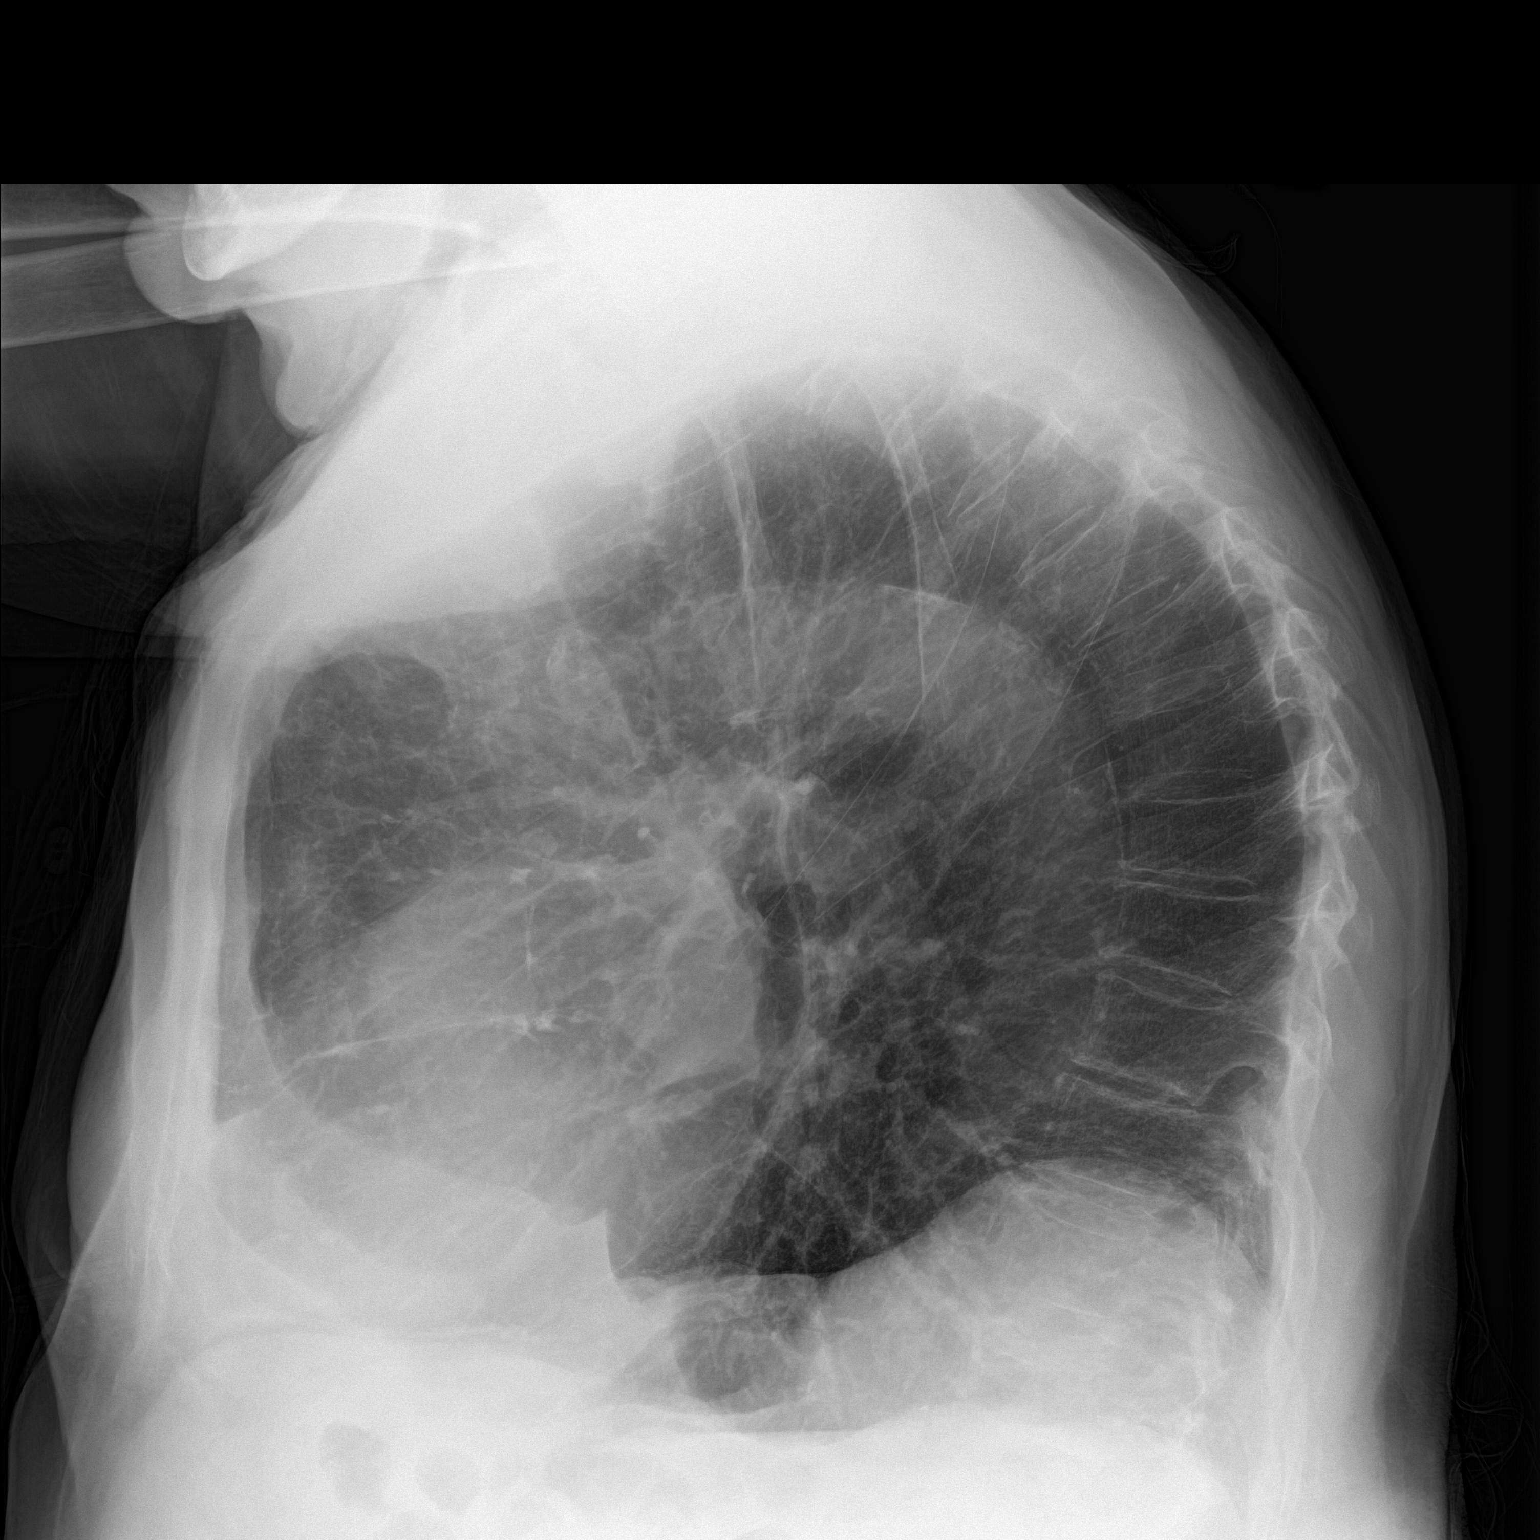

[2 of 2 positions shown; findings below may reference images not displayed]

FINDINGS: The lungs are markedly hyperaerated with increased AP diameter
consistent with emphysema. Focal eventration of the posterior right
hemidiaphragm is noted. Mediastinal hilar contours are unremarkable.
The heart is within normal limits in size. The bones are osteopenic.
A partially compressed mid thoracic vertebral body appears stable.
IMPRESSION: Emphysema.  No definite active process.

## 2017-06-13 ENCOUNTER — Telehealth: Payer: Self-pay | Admitting: Internal Medicine

## 2017-06-13 NOTE — Telephone Encounter (Signed)
Copied from CRM [id number]. Topic: Quick Communication - See Telephone Encounter >> Jun 13, 2017  1:03 PM Floria Raveling A wrote: CRM for notification. See Telephone encounter for: Pt daughter called in and said that her pt needs competency exam for atty.  Please refer to telephone encounters.  Un sure if Dr Alphonsus Sias with do this or [facility]?    06/13/17.

## 2017-06-14 NOTE — Telephone Encounter (Signed)
Spoke to [employer]. Made him an appt 06-22-16 for eval.

## 2017-06-14 NOTE — Telephone Encounter (Signed)
I am willing to see him---but I am unsure if I will find him impaired enough to sign affadavit (like I probably would for wife) Molli Knock to set up appt and then I can refer locally if needed

## 2017-06-14 NOTE — Telephone Encounter (Signed)
He is going to be very resistant to giving up control and is going to likely be borderline for being declared incompetent. I would recommend specialist evaluation for this (like seeing Dr Everlena Cooper also)

## 2017-06-14 NOTE — Telephone Encounter (Signed)
Spoke to pts daughter and advised. She is requesting that he be referred to someone in [place] if Dr Alphonsus Sias feels as though he needs to see the specialist for the eval. Dr Everlena Cooper is in [place] and pt does not like to go on extended rides. She states that the lawyer indicates the evaluation can be completed by any practitioner or physician and is wanting Dr Alphonsus Sias to reconsider if possible

## 2017-06-19 ENCOUNTER — Ambulatory Visit: Payer: Medicare Other | Admitting: Internal Medicine

## 2017-06-22 ENCOUNTER — Encounter: Payer: Self-pay | Admitting: Internal Medicine

## 2017-06-22 ENCOUNTER — Ambulatory Visit (INDEPENDENT_AMBULATORY_CARE_PROVIDER_SITE_OTHER): Payer: Medicare Other | Admitting: Internal Medicine

## 2017-06-22 VITALS — BP 132/74 | HR 75 | Temp 97.8°F | Wt 178.8 lb

## 2017-06-22 DIAGNOSIS — I480 Paroxysmal atrial fibrillation: Secondary | ICD-10-CM | POA: Diagnosis not present

## 2017-06-22 DIAGNOSIS — I5042 Chronic combined systolic (congestive) and diastolic (congestive) heart failure: Secondary | ICD-10-CM | POA: Diagnosis not present

## 2017-06-22 DIAGNOSIS — R413 Other amnesia: Secondary | ICD-10-CM | POA: Diagnosis not present

## 2017-06-22 DIAGNOSIS — J432 Centrilobular emphysema: Secondary | ICD-10-CM

## 2017-06-22 NOTE — Patient Instructions (Signed)
Please call for an appointment with either Drs Isenstein/Dasher or Dr [facility] dermatology practices

## 2017-06-22 NOTE — Assessment & Plan Note (Signed)
Seems slightly irregular today

## 2017-06-22 NOTE — Assessment & Plan Note (Signed)
Compensated now Continue meds Discussed safety and oversight for meds

## 2017-06-22 NOTE — Progress Notes (Signed)
Subjective:    Patient ID: [name], male    DOB: Feb 12, 1926, 84 y.o.   MRN: [id number]  HPI Here with wife and daughter There are concerns about his competency---daughter and son want to take over some financial responsilities  Breathing is okay---stable DOE He is unsure about some of his medications Only occasional  Cough No chest pain No dizziness  Voids okay Still on flomax  Discussed finances He defers these questions to wife---who doesn't seem to understand all the issues They did have credit card transfer made awhile ago-- that they didn't know happened and daughter took a long time to clear this up  Current Outpatient Medications on File Prior to Visit  Medication Sig Dispense Refill  . acetaminophen (TYLENOL) 325 MG tablet Take 650 mg by mouth every 6 (six) hours as needed for moderate pain.     Marland Kitchen amiodarone (PACERONE) 200 MG tablet Take 1 tablet (200 mg total) by mouth daily. 30 tablet 3  . furosemide (LASIX) 40 MG tablet take 1 tablet by mouth once daily if needed 30 tablet 5  . PROAIR HFA 108 (90 Base) MCG/ACT inhaler inhale 2 puffs INTO THE LUNGS every 6 hours if needed for wheezing or shortness of breath 8.5 g 5  . RA ASPIRIN EC 81 MG EC tablet take 1 tablet by mouth daily 30 tablet 11  . SPIRIVA HANDIHALER 18 MCG inhalation capsule inhale the contents of one capsule in the handihaler once daily 30 capsule 11  . tamsulosin (FLOMAX) 0.4 MG CAPS capsule Take 1 capsule (0.4 mg total) by mouth daily. 30 capsule 11   No current facility-administered medications on file prior to visit.     No Known Allergies  Past Medical History:  Diagnosis Date  . Allergy   . Arrhythmia   . Arthritis   . Asthma   . Chicken pox   . Diverticulitis   . Emphysema of lung (HCC)   . Frequent headaches   . History of colon polyps   . Hypertension     Past Surgical History:  Procedure Laterality Date  . APPENDECTOMY    . BACK SURGERY  ~2011   Lumbar fusion  .  TONSILLECTOMY      Family History  Problem Relation Age of Onset  . Arthritis Mother   . Hypertension Mother   . Hyperlipidemia Father   . Hypertension Father   . Alcohol abuse Brother   . Cancer Brother        Lung    Social History   Socioeconomic History  . Marital status: Married    Spouse name: Not on file  . Number of children: 2  . Years of education: Not on file  . Highest education level: Not on file  Social Needs  . Financial resource strain: Not on file  . Food insecurity - worry: Not on file  . Food insecurity - inability: Not on file  . Transportation needs - medical: Not on file  . Transportation needs - non-medical: Not on file  Occupational History  . Occupation: Engineer, manufacturing systems-- Dispensing optician    Comment: retired  Tobacco Use  . Smoking status: Former Smoker    Packs/day: 0.25    Years: 60.00    Pack years: 15.00    Types: Cigarettes    Last attempt to quit: 08/05/2014    Years since quitting: 2.8  . Smokeless tobacco: Former Neurosurgeon  . Tobacco comment: quit x 1 month  Substance and  Sexual Activity  . Alcohol use: No    Alcohol/week: 0.0 oz  . Drug use: No  . Sexual activity: Not Currently  Other Topics Concern  . Not on file  Social History Narrative   Has living will   Wife, then children, should be health care POA.   Would accept resuscitation   Not sure about tube feedings    Review of Systems Some back pain---downplays this now Appetite is okay Weight is down some    Objective:   Physical Exam  Constitutional: He is oriented to person, place, and time. No distress.  Neck: No thyromegaly present.  Cardiovascular: Normal rate and normal heart sounds. Exam reveals no gallop.  ?slightly irregular  Pulmonary/Chest: Effort normal. No respiratory distress. He has no wheezes. He has no rales.  Decreased breath sounds but clear  Musculoskeletal: He exhibits no edema.  Lymphadenopathy:    He has no cervical adenopathy.    Neurological: He is alert and oriented to person, place, and time.  President-- "Trump, ?" [id number] Recall 0/3 even after 5 seconds          Assessment & Plan:

## 2017-06-22 NOTE — Assessment & Plan Note (Signed)
Has mild cognitive impairment He and wife are highly resistant to even giving any oversight to children He does not qualify as medically incompetent to handle affairs Discussed this with both of them and daughter for extended time (15 minutes of 25 minute visit) Wife especially is highly resistant

## 2017-06-22 NOTE — Assessment & Plan Note (Signed)
Fairly stable DOE Uses oxygen only prn

## 2017-07-03 ENCOUNTER — Telehealth: Payer: Self-pay

## 2017-07-03 NOTE — Telephone Encounter (Signed)
I am not sure what I can write. He is not incapable of managing his affairs though I believe his judgement is somewhat impaired. I don't see what I can write that would be helpful--though it would be better if he did most of the financial things (even if just along with his wife)

## 2017-07-03 NOTE — Telephone Encounter (Signed)
Left detailed message on vm for [name]

## 2017-07-03 NOTE — Telephone Encounter (Signed)
Pt's daughter, [name], is asking for a letter on letterhead from Dr Alphonsus Sias for the attorney in regards to the evaluation recently done on the pt. She asked that it mention the thought that the pt seems to refer back to his wife as to not make her angry. Then, they get angry at each other.

## 2017-07-13 ENCOUNTER — Telehealth: Payer: Self-pay | Admitting: Internal Medicine

## 2017-07-13 ENCOUNTER — Ambulatory Visit: Payer: Self-pay | Admitting: *Deleted

## 2017-07-13 NOTE — Telephone Encounter (Signed)
Pt's daughter, [name]" called because the pt is having all over pain and wants to  Tylenol for pain as need and she is wondering if the pt could take 500 mg tablets x 2; per Dr. Karle Starch after visit dated 06/22/17; nurse triage initiated due to worsening pain.

## 2017-07-13 NOTE — Telephone Encounter (Signed)
Pt has appt with Dr Reece Agar on 07/14/17 at 2:15.

## 2017-07-13 NOTE — Telephone Encounter (Signed)
See telephone note dated 07/13/16; pt's daughter [name] called because the pt is having lower back and arm pain that started a couple of days ago but has worsened since last night; she states that he has not taken any tylenol today; nurse triage initiated and per protocol recommendation is made for the pt to see a physician within 3 days; pt offered and accepted appointment with Dr Sharen Hones on 07/14/17 at 1415 and [name] verbalizes understanding; she also states that she will go get some 325 mg and follow the instructions from after visit summary dated 06/22/17. Reason for Disposition . [1] MODERATE back pain (e.g., interferes with normal activities) AND [2] present > 3 days  Answer Assessment - Initial Assessment Questions 1. ONSET: "When did the pain begin?"      A couple of days ago 2. LOCATION: "Where does it hurt?" (upper, mid or lower back)     * lower back 3. SEVERITY: "How bad is the pain?"  (e.g., Scale 1-10; mild, moderate, or severe)   - MILD (1-3): doesn't interfere with normal activities    - MODERATE (4-7): interferes with normal activities or awakens from sleep    - SEVERE (8-10): excruciating pain, unable to do any normal activities      Moderate to severe 4. PATTERN: "Is the pain constant?" (e.g., yes, no; constant, intermittent)      constant 5. RADIATION: "Does the pain shoot into your legs or elsewhere?"     Arms hurt 6. CAUSE:  "What do you think is causing the back pain?"      unsure 7. BACK OVERUSE:  "Any recent lifting of heavy objects, strenuous work or exercise?"     no 8. MEDICATIONS: "What have you taken so far for the pain?" (e.g., nothing, acetaminophen, NSAIDS)     tylenol 9. NEUROLOGIC SYMPTOMS: "Do you have any weakness, numbness, or problems with bowel/bladder control?"     Normally weak 10. OTHER SYMPTOMS: "Do you have any other symptoms?" (e.g., fever, abdominal pain, burning with urination, blood in urine)       no 11. PREGNANCY: "Is there any chance you  are pregnant?" (e.g., yes, no; LMP)       n/a  Protocols used: BACK PAIN-A-AH

## 2017-07-14 ENCOUNTER — Ambulatory Visit (INDEPENDENT_AMBULATORY_CARE_PROVIDER_SITE_OTHER): Payer: Medicare Other | Admitting: Family Medicine

## 2017-07-14 ENCOUNTER — Encounter: Payer: Self-pay | Admitting: Family Medicine

## 2017-07-14 VITALS — BP 118/64 | HR 56 | Temp 97.9°F | Wt 180.0 lb

## 2017-07-14 DIAGNOSIS — R001 Bradycardia, unspecified: Secondary | ICD-10-CM | POA: Diagnosis not present

## 2017-07-14 DIAGNOSIS — M1990 Unspecified osteoarthritis, unspecified site: Secondary | ICD-10-CM

## 2017-07-14 NOTE — Assessment & Plan Note (Signed)
Noted today - anticipate amiodarone related. Pt denies dizziness, chest pain or dyspnea. Advised monitor for now, if new symptoms develop to notify us or cards.

## 2017-07-14 NOTE — Progress Notes (Signed)
BP 118/64 (BP Location: Left Arm, Patient Position: Sitting, Cuff Size: Normal)   Pulse (!) 56   Temp 97.9 F (36.6 C) (Oral)   Wt 180 lb (81.6 kg)   SpO2 94%   BMI 23.75 kg/m    CC: back pain  Subjective:    Patient ID: [name], male    DOB: Nov 20, 1925, 84 y.o.   MRN: [id number]  HPI: [name] is a 84 y.o. male presenting on 07/14/2017 for Back Pain (Located on right side from top to bottom. Started yesterday. Triage nurse suggested Tylenol 325 mg. Says it helps. Accompanied by wife, Mrs. Matthey and daughter, Asmir Lauersdorf. )   2d h/o back pain - describes pain throughout body from bilateral toes to scalp. Increased agitation, irritability yesterday. Started tylenol 650mg  Q6 hours. This has significantly helped. Today feeling better.   Denies fevers/chills, chest pain, abd pain, significant dyspnea, cough, leg swelling. Denies dysuria, urgency.  Uses walker.   H/o lumbar fusion 2011.   Relevant past medical, surgical, family and social history reviewed and updated as indicated. Interim medical history since our last visit reviewed. Allergies and medications reviewed and updated. Outpatient Medications Prior to Visit  Medication Sig Dispense Refill  . acetaminophen (TYLENOL) 325 MG tablet Take 650 mg by mouth every 6 (six) hours as needed for moderate pain.     Marland Kitchen amiodarone (PACERONE) 200 MG tablet Take 1 tablet (200 mg total) by mouth daily. 30 tablet 3  . furosemide (LASIX) 40 MG tablet take 1 tablet by mouth once daily if needed 30 tablet 5  . PROAIR HFA 108 (90 Base) MCG/ACT inhaler inhale 2 puffs INTO THE LUNGS every 6 hours if needed for wheezing or shortness of breath 8.5 g 5  . RA ASPIRIN EC 81 MG EC tablet take 1 tablet by mouth daily 30 tablet 11  . SPIRIVA HANDIHALER 18 MCG inhalation capsule inhale the contents of one capsule in the handihaler once daily 30 capsule 11  . tamsulosin (FLOMAX) 0.4 MG CAPS capsule Take 1 capsule (0.4 mg total) by mouth  daily. 30 capsule 11   No facility-administered medications prior to visit.      Per HPI unless specifically indicated in ROS section below Review of Systems     Objective:    BP 118/64 (BP Location: Left Arm, Patient Position: Sitting, Cuff Size: Normal)   Pulse (!) 56   Temp 97.9 F (36.6 C) (Oral)   Wt 180 lb (81.6 kg)   SpO2 94%   BMI 23.75 kg/m   Wt Readings from Last 3 Encounters:  07/14/17 180 lb (81.6 kg)  06/22/17 178 lb 12.8 oz (81.1 kg)  03/14/17 185 lb (83.9 kg)    Physical Exam  Constitutional: He appears well-developed and well-nourished. No distress.  HENT:  Mouth/Throat: Oropharynx is clear and moist. No oropharyngeal exudate.  Eyes: Conjunctivae and EOM are normal. Pupils are equal, round, and reactive to light. No scleral icterus.  Neck: Normal range of motion. Neck supple.  Cardiovascular: Regular rhythm and intact distal pulses. Bradycardia present.  Pulmonary/Chest: Effort normal and breath sounds normal. No respiratory distress. He has no wheezes. He has no rales.  Musculoskeletal: He exhibits edema (tr).  No pain midline spine No paraspinous mm tenderness Neg seated SLR bilaterally. No pain with int/ext rotation at hip. No pain at SIJ, GTB or sciatic notch bilaterally.   Skin: Skin is warm and dry. No rash noted. No erythema.  No rash appreciated  Psychiatric:  He has a normal mood and affect.  Nursing note and vitals reviewed.  Results for orders placed or performed in visit on 11/03/16  TSH  Result Value Ref Range   TSH 1.700 0.450 - 4.500 uIU/mL      Assessment & Plan:   Problem List Items Addressed This Visit    Arthritis - Primary    Anticipate recent pain related to osteoarthritis flare - now doing better since scheduling tylenol 650mg  QID. Discussed tylenol dosing. Stable exam today. RTC PRN.       Bradycardia    Noted today - anticipate amiodarone related. Pt denies dizziness, chest pain or dyspnea. Advised monitor for now, if new  symptoms develop to notify us or cards.           Follow up plan: No Follow-up on file.  Eustaquio Boyden, MD

## 2017-07-14 NOTE — Assessment & Plan Note (Signed)
Anticipate recent pain related to osteoarthritis flare - now doing better since scheduling tylenol 650mg  QID. Discussed tylenol dosing. Stable exam today. RTC PRN.

## 2017-07-14 NOTE — Patient Instructions (Signed)
I think you had wear and tear arthritis flare.  Continue tylenol as needed - ok to take how you're taking.  If doing well, they try to back off.

## 2017-09-12 ENCOUNTER — Ambulatory Visit: Payer: Medicare Other | Admitting: Internal Medicine

## 2017-10-31 ENCOUNTER — Other Ambulatory Visit: Payer: Self-pay | Admitting: Internal Medicine

## 2017-11-06 ENCOUNTER — Other Ambulatory Visit: Payer: Self-pay | Admitting: Internal Medicine

## 2017-11-30 ENCOUNTER — Ambulatory Visit: Payer: Medicare Other | Admitting: Internal Medicine

## 2017-12-04 ENCOUNTER — Other Ambulatory Visit: Payer: Self-pay | Admitting: Internal Medicine

## 2017-12-18 ENCOUNTER — Ambulatory Visit (INDEPENDENT_AMBULATORY_CARE_PROVIDER_SITE_OTHER): Payer: Medicare Other | Admitting: Internal Medicine

## 2017-12-18 ENCOUNTER — Encounter: Payer: Self-pay | Admitting: Internal Medicine

## 2017-12-18 VITALS — BP 116/74 | HR 73 | Temp 97.8°F | Ht 68.0 in | Wt 177.0 lb

## 2017-12-18 DIAGNOSIS — Z0000 Encounter for general adult medical examination without abnormal findings: Secondary | ICD-10-CM | POA: Diagnosis not present

## 2017-12-18 DIAGNOSIS — I471 Supraventricular tachycardia, unspecified: Secondary | ICD-10-CM

## 2017-12-18 DIAGNOSIS — I5042 Chronic combined systolic (congestive) and diastolic (congestive) heart failure: Secondary | ICD-10-CM | POA: Diagnosis not present

## 2017-12-18 DIAGNOSIS — N401 Enlarged prostate with lower urinary tract symptoms: Secondary | ICD-10-CM

## 2017-12-18 DIAGNOSIS — Z7189 Other specified counseling: Secondary | ICD-10-CM | POA: Diagnosis not present

## 2017-12-18 DIAGNOSIS — J432 Centrilobular emphysema: Secondary | ICD-10-CM

## 2017-12-18 DIAGNOSIS — I480 Paroxysmal atrial fibrillation: Secondary | ICD-10-CM

## 2017-12-18 DIAGNOSIS — R351 Nocturia: Secondary | ICD-10-CM | POA: Diagnosis not present

## 2017-12-18 NOTE — Assessment & Plan Note (Signed)
Regular on amiodarone ASA only due to falls

## 2017-12-18 NOTE — Progress Notes (Signed)
Hearing Screening (Inadequate exam)   Method: Audiometry   125Hz  250Hz  500Hz  1000Hz  2000Hz  3000Hz  4000Hz  6000Hz  8000Hz   Right ear:   0 0 0  0    Left ear:   0 0 0  0      Visual Acuity Screening   Right eye Left eye Both eyes  Without correction:     With correction: 20/30 20/25 20/25

## 2017-12-18 NOTE — Assessment & Plan Note (Signed)
See social history 

## 2017-12-18 NOTE — Assessment & Plan Note (Signed)
No apparent recurrence 

## 2017-12-18 NOTE — Assessment & Plan Note (Signed)
Seems compensated Edema seems stable for him Breathing is not good--but no apparent change

## 2017-12-18 NOTE — Assessment & Plan Note (Signed)
Voids okay on tamsulosin 

## 2017-12-18 NOTE — Assessment & Plan Note (Signed)
Easy DOE but seems stable spiriva

## 2017-12-18 NOTE — Progress Notes (Signed)
Subjective:    Patient ID: [name], male    DOB: 30-Jul-1925, 84 y.o.   MRN: [id number]  HPI Here with wife and caregiver for Medicare wellness and follow up of chronic health conditions Reviewed form and advanced directives Reviewed other doctors No alcohol or tobacco No exercise Hearing is very poor Vision is fair Has 24 hour aides--for him and wife Some help with showering ---getting in and out Dresses himself Bathroom independent ---- remains continent 2 falls this year---no injuries Walks with walker Mild memory issues haven't worsened--checked in January  Gets back and headaches at times Nothing worrisome  No racing heart--"unless I get to moving too fast" No chest pain No SOB No dizziness or syncope Some mild ankle edema at times Most activity is walking around the house  No regular cough Takes the spiriva inhaler Hasn't needed the albuterol much  Voids okay Flow good Empties bladder fine  Current Outpatient Medications on File Prior to Visit  Medication Sig Dispense Refill  . acetaminophen (TYLENOL) 325 MG tablet Take 650 mg by mouth every 6 (six) hours as needed for moderate pain.     Marland Kitchen amiodarone (PACERONE) 200 MG tablet Take 1 tablet (200 mg total) by mouth daily. 30 tablet 3  . ASPIRIN LOW DOSE 81 MG EC tablet TAKE 1 TABLET BY MOUTH ONCE DAILY 30 tablet 11  . furosemide (LASIX) 40 MG tablet TAKE 1 TABLET BY MOUTH ONCE DAILY IF NEEDED 30 tablet 11  . PROAIR HFA 108 (90 Base) MCG/ACT inhaler inhale 2 puffs INTO THE LUNGS every 6 hours if needed for wheezing or shortness of breath 8.5 g 5  . SPIRIVA HANDIHALER 18 MCG inhalation capsule INHALE THE CONTENTS OF ONE CAPSULE IN THE HANDIHALER ONCE DAILY 30 capsule 11  . tamsulosin (FLOMAX) 0.4 MG CAPS capsule TAKE ONE CAPSULE BY MOUTH DAILY 30 capsule 11   No current facility-administered medications on file prior to visit.     No Known Allergies  Past Medical History:  Diagnosis Date  . Allergy     . Arrhythmia   . Arthritis   . Asthma   . Chicken pox   . Diverticulitis   . Emphysema of lung (HCC)   . Frequent headaches   . History of colon polyps   . Hypertension     Past Surgical History:  Procedure Laterality Date  . APPENDECTOMY    . BACK SURGERY  ~2011   Lumbar fusion  . TONSILLECTOMY      Family History  Problem Relation Age of Onset  . Arthritis Mother   . Hypertension Mother   . Hyperlipidemia Father   . Hypertension Father   . Alcohol abuse Brother   . Cancer Brother        Lung    Social History   Socioeconomic History  . Marital status: Married    Spouse name: Not on file  . Number of children: 2  . Years of education: Not on file  . Highest education level: Not on file  Occupational History  . 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 Financial resource strain: Not on file  . Food insecurity:    Worry: Not on file    Inability: Not on file  . Transportation needs:    Medical: Not on file    Non-medical: Not on file  Tobacco Use  . Smoking status: Former Smoker    Packs/day: 0.25  Years: 60.00    Pack years: 15.00    Types: Cigarettes    Last attempt to quit: 08/05/2014    Years since quitting: 3.3  . Smokeless tobacco: Former Neurosurgeon  . Tobacco comment: quit x 1 month  Substance and Sexual Activity  . Alcohol use: No    Alcohol/week: 0.0 oz  . Drug use: No  . Sexual activity: Not Currently  Lifestyle  . Physical activity:    Days per week: Not on file    Minutes per session: Not on file  . Stress: Not on file  Relationships  . Social connections:    Talks on phone: Not on file    Gets together: Not on file    Attends religious service: Not on file    Active member of club or organization: Not on file    Attends meetings of clubs or organizations: Not on file    Relationship status: Not on file  . Intimate partner violence:    Fear of current or ex partner: Not on  file    Emotionally abused: Not on file    Physically abused: Not on file    Forced sexual activity: Not on file  Other Topics Concern  . Not on file  Social History Narrative   Has living will   Wife, then children, should be health care POA.   Would accept resuscitation   Not sure about tube feedings    Review of Systems Mild shaking of hands--doesn't affect function Own teeth ---poor repair. No dentist for a while No skin rash or lesions. Appetite is generally okay---weight stable Wears seat belt Bowels are fine. No blood No heartburn or dysphagia    Objective:   Physical Exam  Constitutional: He appears well-developed. No distress.  SOB just getting socks back on  HENT:  Mouth/Throat: Oropharynx is clear and moist. No oropharyngeal exudate.  Neck: No thyromegaly present.  Cardiovascular: Normal rate, regular rhythm and normal heart sounds. Exam reveals no gallop.  Heart distant Feet warm but no palpable pulses  Respiratory: Effort normal. No respiratory distress. He has no wheezes. He has no rales.  Sig decreased breath sounds but clear  GI: Soft. There is no tenderness.  Musculoskeletal:  1+ pedal and ankle edema  Lymphadenopathy:    He has no cervical adenopathy.  Skin:  No foot ulcers Mycotic toenails  Psychiatric:  Alert and interacts normally (other than HOH). Jokes and good spirits           Assessment & Plan:

## 2017-12-18 NOTE — Assessment & Plan Note (Signed)
I have personally reviewed the Medicare Annual Wellness questionnaire and have noted 1. The patient's medical and social history 2. Their use of alcohol, tobacco or illicit drugs 3. Their current medications and supplements 4. The patient's functional ability including ADL's, fall risks, home safety risks and hearing or visual             impairment. 5. Diet and physical activities 6. Evidence for depression or mood disorders  The patients weight, height, BMI and visual acuity have been recorded in the chart I have made referrals, counseling and provided education to the patient based review of the above and I have provided the pt with a written personalized care plan for preventive services.  I have provided you with a copy of your personalized plan for preventive services. Please take the time to review along with your updated medication list.  Flu vaccine in fall No cancer screening due to age Will consider shingrix---but probably not  Needs sig aide assistance--but doing okay at home

## 2017-12-19 LAB — COMPREHENSIVE METABOLIC PANEL
ALT: 9 U/L (ref 0–53)
AST: 16 U/L (ref 0–37)
Albumin: 3.7 g/dL (ref 3.5–5.2)
Alkaline Phosphatase: 81 U/L (ref 39–117)
BILIRUBIN TOTAL: 0.6 mg/dL (ref 0.2–1.2)
BUN: 24 mg/dL — ABNORMAL HIGH (ref 6–23)
CHLORIDE: 104 meq/L (ref 96–112)
CO2: 28 meq/L (ref 19–32)
CREATININE: 1.22 mg/dL (ref 0.40–1.50)
Calcium: 8.9 mg/dL (ref 8.4–10.5)
GFR: 59.3 mL/min — ABNORMAL LOW (ref >60.00)
GLUCOSE: 100 mg/dL — AB (ref 70–99)
Potassium: 4.2 mEq/L (ref 3.5–5.1)
Sodium: 140 mEq/L (ref 135–145)
Total Protein: 6.7 g/dL (ref 6.0–8.3)

## 2017-12-19 LAB — CBC
HEMATOCRIT: 38.5 % — AB (ref 39.0–52.0)
Hemoglobin: 13.2 g/dL (ref 13.0–17.0)
MCHC: 34.4 g/dL (ref 30.0–36.0)
MCV: 95.2 fl (ref 78.0–100.0)
Platelets: 190 10*3/uL (ref 150.0–400.0)
RBC: 4.04 Mil/uL — AB (ref 4.22–5.81)
RDW: 14 % (ref 11.5–15.5)
WBC: 7.8 10*3/uL (ref 4.0–10.5)

## 2017-12-19 LAB — T4, FREE: FREE T4: 1.15 ng/dL (ref 0.60–1.60)

## 2017-12-19 IMAGING — DX DG CHEST 1V PORT
1 series · 1 of 1 positions shown · non-contrast
Comparison: 02/24/2015

CLINICAL DATA: Shortness of breath with lower extremity swelling.

EXAM:
PORTABLE CHEST 1 VIEW

[chest ap]
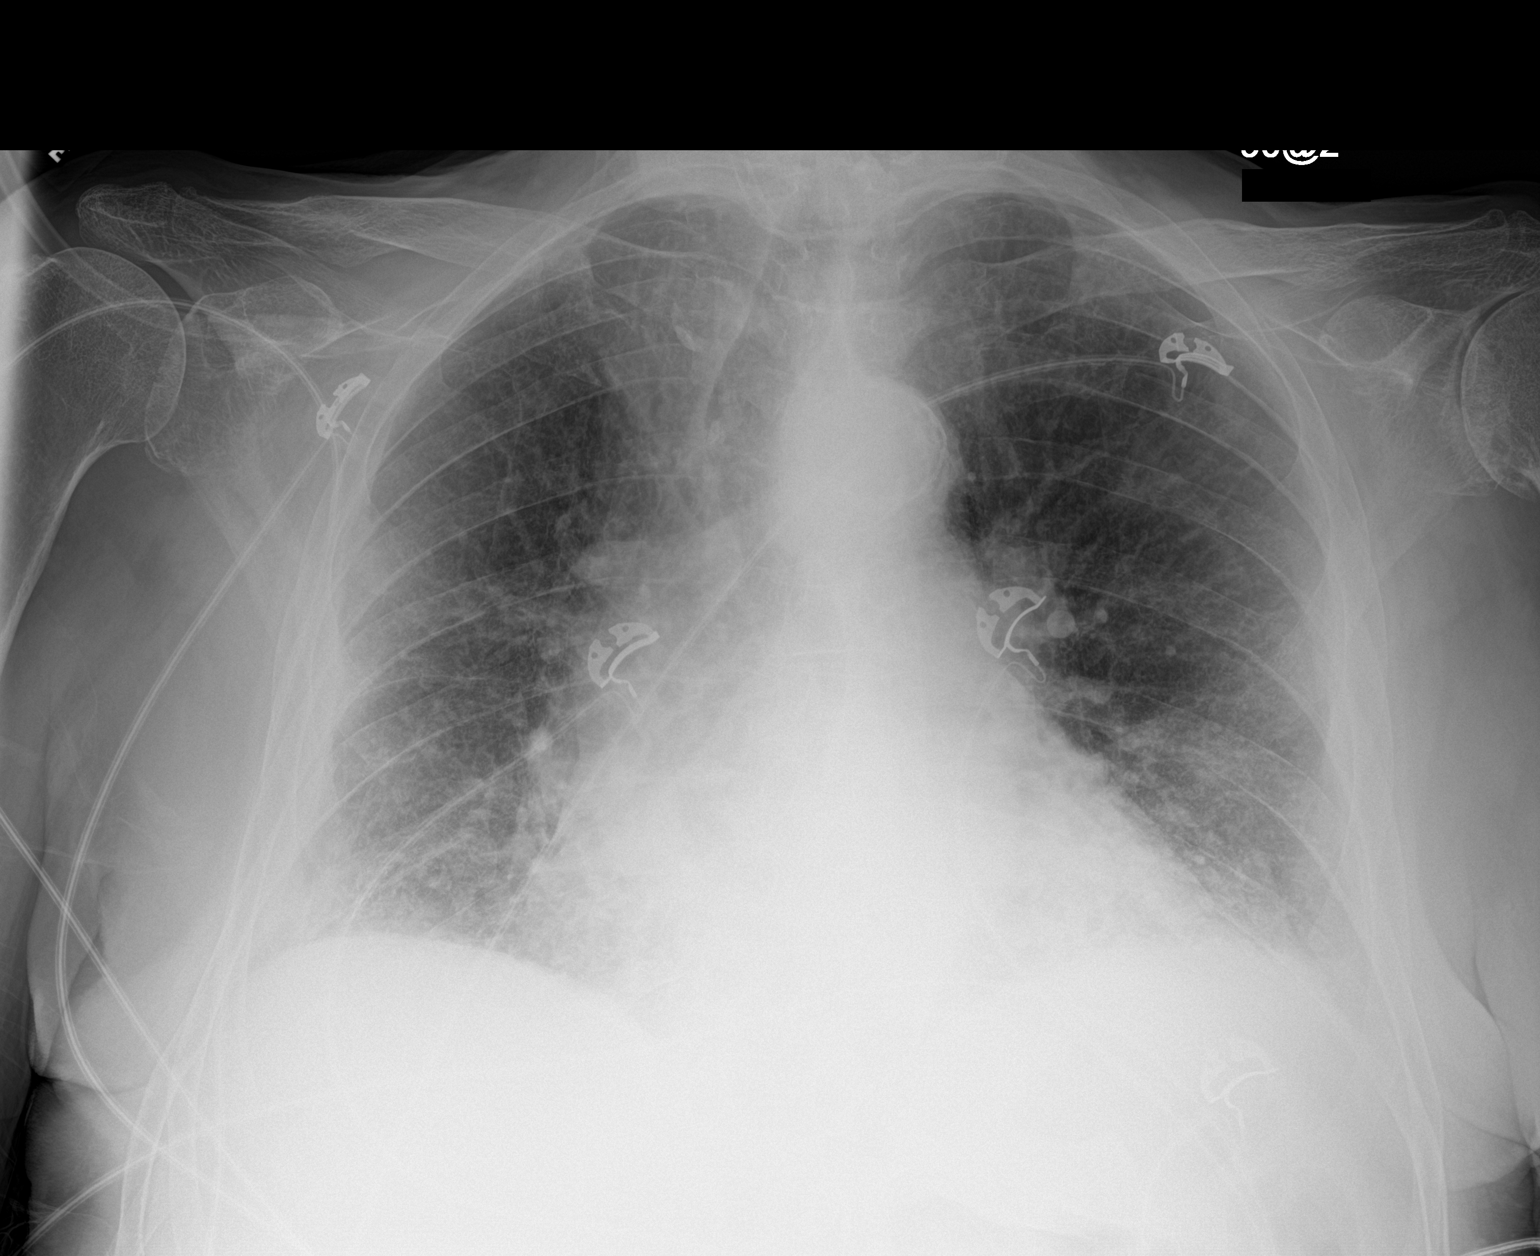

[1 of 1 positions shown; findings below may reference images not displayed]

FINDINGS: 3377 hours. Lung volumes are low. Interstitial markings are
diffusely coarsened with chronic features. Vascular congestion noted
without overt airspace pulmonary edema. There is bibasilar
atelectasis and a component of dependent interstitial edema cannot
be excluded. No substantial pleural effusion. The cardio pericardial
silhouette is enlarged. Bones are diffusely demineralized. Telemetry
leads overlie the chest.
IMPRESSION: Low volume film with cardiomegaly with vascular congestion.
Symmetric interstitial and patchy opacity at the lung bases may be
related dependent edema and/ or atelectasis.

## 2017-12-28 ENCOUNTER — Encounter: Payer: Self-pay | Admitting: Internal Medicine

## 2017-12-28 ENCOUNTER — Ambulatory Visit (INDEPENDENT_AMBULATORY_CARE_PROVIDER_SITE_OTHER): Payer: Medicare Other | Admitting: Internal Medicine

## 2017-12-28 VITALS — BP 110/50 | HR 49 | Ht 72.0 in | Wt 177.0 lb

## 2017-12-28 DIAGNOSIS — Z79899 Other long term (current) drug therapy: Secondary | ICD-10-CM

## 2017-12-28 DIAGNOSIS — I5022 Chronic systolic (congestive) heart failure: Secondary | ICD-10-CM

## 2017-12-28 DIAGNOSIS — I471 Supraventricular tachycardia: Secondary | ICD-10-CM | POA: Diagnosis not present

## 2017-12-28 DIAGNOSIS — I428 Other cardiomyopathies: Secondary | ICD-10-CM | POA: Diagnosis not present

## 2017-12-28 DIAGNOSIS — R001 Bradycardia, unspecified: Secondary | ICD-10-CM | POA: Diagnosis not present

## 2017-12-28 MED ORDER — FUROSEMIDE 80 MG PO TABS: 80.0000 mg | ORAL_TABLET | Freq: Every day | ORAL | 6 refills | Status: DC

## 2017-12-28 MED ORDER — FUROSEMIDE 40 MG PO TABS: ORAL_TABLET | ORAL | Status: DC

## 2017-12-28 NOTE — Patient Instructions (Addendum)
Medication Instructions: - Your physician has recommended you make the following change in your medication:   1) INCREASE lasix (furosemide) to 80 mg- take 1 tablet by mouth once daily  Labwork: - none ordered  Procedures/Testing: - none ordered  Follow-Up: - Your physician wants you to follow-up in: 6 months with Dr. Graciela Husbands. You will receive a reminder letter in the mail two months in advance. If you don't receive a letter, please call our office to schedule the follow-up appointment.   Any Additional Special Instructions Will Be Listed Below (If Applicable).     If you need a refill on your cardiac medications before your next appointment, please call your pharmacy.

## 2017-12-28 NOTE — Progress Notes (Signed)
Patient Care Team: Karie Schwalbe, MD as PCP - General (Internal Medicine) Duke Salvia, MD as Consulting Physician (Cardiology) Erin Fulling, MD as Consulting Physician (Pulmonary Disease) Delma Freeze, FNP as Nurse Practitioner (Family Medicine) Antonieta Iba, MD as Consulting Physician (Cardiology)   HPI  [facility] [name] is a 84 y.o. male Seen in follow-up for SVT. Included adenosine sensitive tachycardia as well as apparently atrial fibrillation although the only strips available and demonstrated PACs.   DATE HR PR interval QRSduration Dose-  *12 */16  -- 94 0  1/17  -- 94 0  6/17  208 104 AMIO  9/17 72 23 138 AMIO  5/18 63 236 138 AMIO  7/19 49 160 134 AMIO    Date TSH LFTs  6/17  1.72 14  4/18    18  5/19 1.7 18        DATE TEST EF   2/16 Echo   55-60 % [facility]  4/17 Echo   25-30 %           Chest CT scan evidence of emphysema  Swelling B lower extremities; some difficulty with walking but no SOB   No tremor or nausea or cough on amio  Not sure why he is on amio,  Not sure what medicines he takes, and his wife is also not sure  Past Medical History:  Diagnosis Date  . Allergy   . Arrhythmia   . Arthritis   . Asthma   . Chicken pox   . Diverticulitis   . Emphysema of lung (HCC)   . Frequent headaches   . History of colon polyps   . Hypertension     Past Surgical History:  Procedure Laterality Date  . APPENDECTOMY    . BACK SURGERY  ~2011   Lumbar fusion  . TONSILLECTOMY      Current Outpatient Medications  Medication Sig Dispense Refill  . acetaminophen (TYLENOL) 325 MG tablet Take 650 mg by mouth every 6 (six) hours as needed for moderate pain.     Marland Kitchen amiodarone (PACERONE) 200 MG tablet Take 1 tablet (200 mg total) by mouth daily. 30 tablet 3  . ASPIRIN LOW DOSE 81 MG EC tablet TAKE 1 TABLET BY MOUTH ONCE DAILY 30 tablet 11  . furosemide (LASIX) 40 MG tablet TAKE 1 TABLET BY MOUTH ONCE DAILY IF NEEDED 30 tablet 11  .  PROAIR HFA 108 (90 Base) MCG/ACT inhaler inhale 2 puffs INTO THE LUNGS every 6 hours if needed for wheezing or shortness of breath 8.5 g 5  . SPIRIVA HANDIHALER 18 MCG inhalation capsule INHALE THE CONTENTS OF ONE CAPSULE IN THE HANDIHALER ONCE DAILY 30 capsule 11  . tamsulosin (FLOMAX) 0.4 MG CAPS capsule TAKE ONE CAPSULE BY MOUTH DAILY 30 capsule 11   No current facility-administered medications for this visit.     No Known Allergies    Review of Systems negative except from HPI and PMH  Physical Exam BP (!) 110/50 (BP Location: Right Arm, Patient Position: Sitting, Cuff Size: Normal)   Pulse (!) 49   Ht 6' (1.829 m)   Wt 177 lb (80.3 kg)   BMI 24.01 kg/m   right arm blood pressure 94 Well developed and nourished in no acute distress HENT normal Neck supple with JVP--- Clear Regular rate and rhythm, no murmurs or gallops Abd-soft with active BS No Clubbing cyanosis 2+edema Skin-warm and dry A & Oriented  Grossly normal sensory and motor  function   ECG demonstrates sinus @ 49 16/14/43  Assessment and  Plan  SVT-incessant  Sinus bradycardia  COPD/emphysema  High risk medication surveillance  NICM  CHF chronic systolic/acute  RBBB/Conduction system disease   With volume overload, will make sure taking diuretic daily-- if so willincrease 40>>80  I am also worried about worsening sinus bradycardia As above  Will need to weigh decreasing or stopping amio if continues to slow, but no attributable symptoms  The short PR interval makes me wonder also about whether this is junctional  Amio labs in order

## 2018-01-01 ENCOUNTER — Other Ambulatory Visit: Payer: Self-pay | Admitting: Internal Medicine

## 2018-01-01 DIAGNOSIS — J449 Chronic obstructive pulmonary disease, unspecified: Secondary | ICD-10-CM

## 2018-02-15 DIAGNOSIS — R413 Other amnesia: Secondary | ICD-10-CM

## 2018-04-08 ENCOUNTER — Other Ambulatory Visit: Payer: Self-pay | Admitting: Internal Medicine

## 2018-04-12 NOTE — Telephone Encounter (Signed)
[name] pts daughter has called to add on to mychart note that [name] wonders if pt should see a specialist at [facility] or should pt see neurologist. [name] did not want to make appt with Dr Alphonsus Sias until Dr Alphonsus Sias review this note.  Pt had back surgery in 2009. [name] request cb.

## 2018-04-12 NOTE — Telephone Encounter (Signed)
Please call her to see if she wants to bring him in--can give a same day tomorrow

## 2018-04-17 ENCOUNTER — Telehealth: Payer: Self-pay | Admitting: Internal Medicine

## 2018-04-17 ENCOUNTER — Ambulatory Visit: Payer: Medicare Other | Admitting: Internal Medicine

## 2018-04-17 DIAGNOSIS — Z0289 Encounter for other administrative examinations: Secondary | ICD-10-CM

## 2018-04-17 NOTE — Telephone Encounter (Signed)
Urine is dark but no reported pain--but still with the back pain Drinks decaf tea all day No fever--but he is "hot" per aide Still aggressive Eats well at breakfast usually--but off in past day. Overall eating less lately  She doesn't think he is dangerous to her mom Discussed that a UTI is unlikely (though possible) I don't recommend empiric antibiotic  Will need to be seen somewhere--- hopefully he will agree to a visit soon I will add him on tomorrow if they think he will agree to come in

## 2018-04-17 NOTE — Telephone Encounter (Signed)
Pt's daughter called and stated pt refused to come to his appt today. Pt isn't thinking and communication right, his urine is brown and his appetite is very poor. Daughter is very concerned and want to speak to Dr Alphonsus Sias.

## 2018-04-30 ENCOUNTER — Telehealth: Payer: Self-pay | Admitting: *Deleted

## 2018-04-30 ENCOUNTER — Telehealth: Payer: Self-pay

## 2018-04-30 MED ORDER — AMIODARONE HCL 200 MG PO TABS: 200.0000 mg | ORAL_TABLET | Freq: Every day | ORAL | 6 refills | Status: AC

## 2018-04-30 NOTE — Telephone Encounter (Signed)
Spoke to pt's daughter [name] who states she had spoken to Dr Alphonsus Sias regarding a dr doing house calls for her father. She states Dr Alphonsus Sias advised that the pts medical situation did not qualify for him to come out, but [name] has found another company called "[facility]" out of [place], [place] and she is wanting to know if Dr Alphonsus Sias has ever heard of this company who services the [place], and his opinions, if so. If not, she is inquiring if Dr Alphonsus Sias has options in mind. pls advise

## 2018-04-30 NOTE — Telephone Encounter (Signed)
I did not tell her he doesn't qualify as I actually do make house calls---but not on an acute basis. I have some people I only see in their homes on a scheduled basis about every 2 months. We can consider that if he would let me.  I have heard of [facility]. They are reputable and most people are satisfied with their care. If they decide on that, they would take over all his care (we don't work together with them)

## 2018-04-30 NOTE — Telephone Encounter (Signed)
Spoke to [employer]. She will look in to [employer]. She asked if it would be ok if they came out as a consult for them to decide if they wanted to make the change. I advised her I cannot answer that question for that organization. On our part, it is like a 2nd opinion. We just need to know what is decided.

## 2018-04-30 NOTE — Telephone Encounter (Signed)
Refill sent for amiodarone 200 mg to [facility].

## 2018-04-30 NOTE — Telephone Encounter (Signed)
Spoke to [name]. She said in the last few weeks his speech has slurred. He gets distracted in a conversation. He is allowing the help to walk with him. She feels like he is declining cognitively. She feels lost.  He will not go to a doctor and she cannot make him go. She is going to look more in to [employer]. He goes to neurology 05-08-18. She is asking if the 1st week of December is available for a home consult.

## 2018-04-30 NOTE — Telephone Encounter (Signed)
If I put him on my list, I probably can't make it out till sometime in January (I make my visits on Wednesdays and 1 is my vacation and 2 are holidays in December so it is putting me behind)

## 2018-05-07 ENCOUNTER — Other Ambulatory Visit: Payer: Self-pay

## 2018-05-07 MED ORDER — FUROSEMIDE 80 MG PO TABS: 80.0000 mg | ORAL_TABLET | Freq: Every day | ORAL | 6 refills | Status: AC

## 2018-05-07 NOTE — Telephone Encounter (Signed)
Refill sent for Furosemide 80 mg

## 2018-05-08 ENCOUNTER — Other Ambulatory Visit: Payer: Self-pay | Admitting: Neurology

## 2018-05-08 DIAGNOSIS — F0151 Vascular dementia with behavioral disturbance: Secondary | ICD-10-CM | POA: Diagnosis not present

## 2018-05-08 DIAGNOSIS — R413 Other amnesia: Secondary | ICD-10-CM | POA: Diagnosis not present

## 2018-05-08 DIAGNOSIS — G309 Alzheimer's disease, unspecified: Secondary | ICD-10-CM | POA: Diagnosis not present

## 2018-05-08 DIAGNOSIS — R52 Pain, unspecified: Secondary | ICD-10-CM | POA: Diagnosis not present

## 2018-05-10 DIAGNOSIS — J449 Chronic obstructive pulmonary disease, unspecified: Secondary | ICD-10-CM | POA: Diagnosis not present

## 2018-05-10 DIAGNOSIS — Z79899 Other long term (current) drug therapy: Secondary | ICD-10-CM | POA: Diagnosis not present

## 2018-05-10 DIAGNOSIS — R4189 Other symptoms and signs involving cognitive functions and awareness: Secondary | ICD-10-CM | POA: Diagnosis not present

## 2018-05-10 DIAGNOSIS — R609 Edema, unspecified: Secondary | ICD-10-CM | POA: Diagnosis not present

## 2018-05-10 DIAGNOSIS — I4891 Unspecified atrial fibrillation: Secondary | ICD-10-CM | POA: Diagnosis not present

## 2018-05-13 DIAGNOSIS — Z79899 Other long term (current) drug therapy: Secondary | ICD-10-CM | POA: Diagnosis not present

## 2018-05-14 ENCOUNTER — Emergency Department: Payer: Medicare Other

## 2018-05-14 ENCOUNTER — Inpatient Hospital Stay
Admission: EM | Admit: 2018-05-14 | Discharge: 2018-06-06 | DRG: 871 | Disposition: E | Payer: Medicare Other | Attending: Internal Medicine | Admitting: Internal Medicine

## 2018-05-14 ENCOUNTER — Other Ambulatory Visit: Payer: Self-pay

## 2018-05-14 DIAGNOSIS — R0902 Hypoxemia: Secondary | ICD-10-CM | POA: Diagnosis not present

## 2018-05-14 DIAGNOSIS — S22081A Stable burst fracture of T11-T12 vertebra, initial encounter for closed fracture: Secondary | ICD-10-CM | POA: Diagnosis present

## 2018-05-14 DIAGNOSIS — A419 Sepsis, unspecified organism: Secondary | ICD-10-CM | POA: Diagnosis not present

## 2018-05-14 DIAGNOSIS — Z7401 Bed confinement status: Secondary | ICD-10-CM

## 2018-05-14 DIAGNOSIS — R059 Cough, unspecified: Secondary | ICD-10-CM

## 2018-05-14 DIAGNOSIS — Z7189 Other specified counseling: Secondary | ICD-10-CM

## 2018-05-14 DIAGNOSIS — Z79899 Other long term (current) drug therapy: Secondary | ICD-10-CM

## 2018-05-14 DIAGNOSIS — I480 Paroxysmal atrial fibrillation: Secondary | ICD-10-CM | POA: Diagnosis present

## 2018-05-14 DIAGNOSIS — E8809 Other disorders of plasma-protein metabolism, not elsewhere classified: Secondary | ICD-10-CM | POA: Diagnosis present

## 2018-05-14 DIAGNOSIS — N189 Chronic kidney disease, unspecified: Secondary | ICD-10-CM

## 2018-05-14 DIAGNOSIS — N182 Chronic kidney disease, stage 2 (mild): Secondary | ICD-10-CM | POA: Diagnosis present

## 2018-05-14 DIAGNOSIS — J449 Chronic obstructive pulmonary disease, unspecified: Secondary | ICD-10-CM

## 2018-05-14 DIAGNOSIS — Z7951 Long term (current) use of inhaled steroids: Secondary | ICD-10-CM

## 2018-05-14 DIAGNOSIS — X58XXXA Exposure to other specified factors, initial encounter: Secondary | ICD-10-CM | POA: Diagnosis present

## 2018-05-14 DIAGNOSIS — R4182 Altered mental status, unspecified: Secondary | ICD-10-CM

## 2018-05-14 DIAGNOSIS — J441 Chronic obstructive pulmonary disease with (acute) exacerbation: Secondary | ICD-10-CM | POA: Diagnosis not present

## 2018-05-14 DIAGNOSIS — R0600 Dyspnea, unspecified: Secondary | ICD-10-CM

## 2018-05-14 DIAGNOSIS — Z9181 History of falling: Secondary | ICD-10-CM

## 2018-05-14 DIAGNOSIS — R338 Other retention of urine: Secondary | ICD-10-CM | POA: Diagnosis present

## 2018-05-14 DIAGNOSIS — I130 Hypertensive heart and chronic kidney disease with heart failure and stage 1 through stage 4 chronic kidney disease, or unspecified chronic kidney disease: Secondary | ICD-10-CM | POA: Diagnosis present

## 2018-05-14 DIAGNOSIS — R410 Disorientation, unspecified: Secondary | ICD-10-CM | POA: Diagnosis not present

## 2018-05-14 DIAGNOSIS — F0390 Unspecified dementia without behavioral disturbance: Secondary | ICD-10-CM | POA: Diagnosis present

## 2018-05-14 DIAGNOSIS — R05 Cough: Secondary | ICD-10-CM

## 2018-05-14 DIAGNOSIS — I509 Heart failure, unspecified: Secondary | ICD-10-CM

## 2018-05-14 DIAGNOSIS — J9601 Acute respiratory failure with hypoxia: Secondary | ICD-10-CM | POA: Diagnosis present

## 2018-05-14 DIAGNOSIS — J189 Pneumonia, unspecified organism: Secondary | ICD-10-CM | POA: Diagnosis present

## 2018-05-14 DIAGNOSIS — R000 Tachycardia, unspecified: Secondary | ICD-10-CM | POA: Diagnosis not present

## 2018-05-14 DIAGNOSIS — Z66 Do not resuscitate: Secondary | ICD-10-CM | POA: Diagnosis present

## 2018-05-14 DIAGNOSIS — N179 Acute kidney failure, unspecified: Secondary | ICD-10-CM | POA: Diagnosis present

## 2018-05-14 DIAGNOSIS — E876 Hypokalemia: Secondary | ICD-10-CM

## 2018-05-14 DIAGNOSIS — N289 Disorder of kidney and ureter, unspecified: Secondary | ICD-10-CM

## 2018-05-14 DIAGNOSIS — Z7982 Long term (current) use of aspirin: Secondary | ICD-10-CM

## 2018-05-14 DIAGNOSIS — M545 Low back pain: Secondary | ICD-10-CM | POA: Diagnosis not present

## 2018-05-14 DIAGNOSIS — Z87891 Personal history of nicotine dependence: Secondary | ICD-10-CM

## 2018-05-14 DIAGNOSIS — R739 Hyperglycemia, unspecified: Secondary | ICD-10-CM | POA: Diagnosis present

## 2018-05-14 DIAGNOSIS — K5900 Constipation, unspecified: Secondary | ICD-10-CM | POA: Diagnosis present

## 2018-05-14 DIAGNOSIS — I5023 Acute on chronic systolic (congestive) heart failure: Secondary | ICD-10-CM | POA: Diagnosis present

## 2018-05-14 DIAGNOSIS — N401 Enlarged prostate with lower urinary tract symptoms: Secondary | ICD-10-CM | POA: Diagnosis present

## 2018-05-14 DIAGNOSIS — J440 Chronic obstructive pulmonary disease with acute lower respiratory infection: Secondary | ICD-10-CM | POA: Diagnosis present

## 2018-05-14 DIAGNOSIS — Z515 Encounter for palliative care: Secondary | ICD-10-CM | POA: Diagnosis not present

## 2018-05-14 DIAGNOSIS — R918 Other nonspecific abnormal finding of lung field: Secondary | ICD-10-CM

## 2018-05-14 DIAGNOSIS — S22082A Unstable burst fracture of T11-T12 vertebra, initial encounter for closed fracture: Secondary | ICD-10-CM | POA: Diagnosis not present

## 2018-05-14 LAB — BASIC METABOLIC PANEL
Anion gap: 8 (ref 5–15)
BUN: 28 mg/dL — ABNORMAL HIGH (ref 8–23)
CO2: 30 mmol/L (ref 22–32)
Calcium: 8.7 mg/dL — ABNORMAL LOW (ref 8.9–10.3)
Chloride: 100 mmol/L (ref 98–111)
Creatinine, Ser: 1.37 mg/dL — ABNORMAL HIGH (ref 0.61–1.24)
GFR calc Af Amer: 52 mL/min — ABNORMAL LOW (ref >60)
GFR calc non Af Amer: 45 mL/min — ABNORMAL LOW (ref >60)
GLUCOSE: 130 mg/dL — AB (ref 70–99)
Potassium: 3.2 mmol/L — ABNORMAL LOW (ref 3.5–5.1)
Sodium: 138 mmol/L (ref 135–145)

## 2018-05-14 LAB — CBC
HCT: 43.3 % (ref 39.0–52.0)
Hemoglobin: 13.9 g/dL (ref 13.0–17.0)
MCH: 32 pg (ref 26.0–34.0)
MCHC: 32.1 g/dL (ref 30.0–36.0)
MCV: 99.5 fL (ref 80.0–100.0)
Platelets: 208 10*3/uL (ref 150–400)
RBC: 4.35 MIL/uL (ref 4.22–5.81)
RDW: 15.8 % — ABNORMAL HIGH (ref 11.5–15.5)
WBC: 8.7 10*3/uL (ref 4.0–10.5)
nRBC: 0 % (ref 0.0–0.2)

## 2018-05-14 LAB — URINALYSIS, COMPLETE (UACMP) WITH MICROSCOPIC
Bacteria, UA: NONE SEEN
Bilirubin Urine: NEGATIVE
Glucose, UA: NEGATIVE mg/dL
HGB URINE DIPSTICK: NEGATIVE
Ketones, ur: NEGATIVE mg/dL
Leukocytes, UA: NEGATIVE
Nitrite: NEGATIVE
Protein, ur: NEGATIVE mg/dL
Specific Gravity, Urine: 1.015 (ref 1.005–1.030)
Squamous Epithelial / HPF: NONE SEEN (ref 0–5)
pH: 5 (ref 5.0–8.0)

## 2018-05-14 LAB — HEPATIC FUNCTION PANEL
ALBUMIN: 3.1 g/dL — AB (ref 3.5–5.0)
ALK PHOS: 104 U/L (ref 38–126)
ALT: 13 U/L (ref 0–44)
AST: 19 U/L (ref 15–41)
Bilirubin, Direct: 0.4 mg/dL — ABNORMAL HIGH (ref 0.0–0.2)
Indirect Bilirubin: 0.7 mg/dL (ref 0.3–0.9)
Total Bilirubin: 1.1 mg/dL (ref 0.3–1.2)
Total Protein: 6.1 g/dL — ABNORMAL LOW (ref 6.5–8.1)

## 2018-05-14 LAB — TROPONIN I
Troponin I: 0.03 ng/mL (ref <0.03)
Troponin I: 0.03 ng/mL (ref <0.03)

## 2018-05-14 LAB — AMMONIA: AMMONIA: 22 umol/L (ref 9–35)

## 2018-05-14 LAB — BRAIN NATRIURETIC PEPTIDE: B Natriuretic Peptide: 259 pg/mL — ABNORMAL HIGH (ref 0.0–100.0)

## 2018-05-14 MED ORDER — SODIUM CHLORIDE 0.9 % IV BOLUS
1000.0000 mL | Freq: Once | INTRAVENOUS | Status: AC
  Administered 2018-05-14: 1000 mL via INTRAVENOUS

## 2018-05-14 MED ORDER — SODIUM CHLORIDE 0.9 % IV BOLUS
500.0000 mL | Freq: Once | INTRAVENOUS | Status: AC
  Administered 2018-05-14: 500 mL via INTRAVENOUS

## 2018-05-14 MED ORDER — POTASSIUM CHLORIDE CRYS ER 20 MEQ PO TBCR
20.0000 meq | EXTENDED_RELEASE_TABLET | Freq: Once | ORAL | Status: AC
  Administered 2018-05-14: 20 meq via ORAL
  Filled 2018-05-14: qty 1

## 2018-05-14 MED ORDER — HALOPERIDOL LACTATE 5 MG/ML IJ SOLN
2.5000 mg | Freq: Once | INTRAMUSCULAR | Status: AC
  Administered 2018-05-14: 2.5 mg via INTRAVENOUS
  Filled 2018-05-14: qty 1

## 2018-05-14 MED ORDER — LEVOFLOXACIN IN D5W 750 MG/150ML IV SOLN
750.0000 mg | Freq: Once | INTRAVENOUS | Status: AC
  Administered 2018-05-14: 750 mg via INTRAVENOUS
  Filled 2018-05-14: qty 150

## 2018-05-14 MED ORDER — ACETAMINOPHEN 325 MG PO TABS
650.0000 mg | ORAL_TABLET | Freq: Once | ORAL | Status: AC
  Administered 2018-05-14: 650 mg via ORAL
  Filled 2018-05-14: qty 2

## 2018-05-14 NOTE — ED Provider Notes (Addendum)
[facility] Emergency Department Provider Note  ____________________________________________  Time seen: Approximately 3:12 PM  I have reviewed the triage vital signs and the nursing notes.   HISTORY  Chief Complaint Urinary Retention    HPI [name] is a 84 y.o. male with a history of dementia, COPD on 3 L nasal cannula, paroxysmal atrial fibrillation not anticoagulated, presenting for altered mental status, decreased urination, and low back pain.  The patient is unable to give most of the history due to his dementia.  He is accompanied by a caregiver who sees him at his home 4 days a week.  Per report, the patient has had 1 month of progressive decline.  He has had decreased exercise capacity; 1 month ago he could walk with a walker and now he is unable to get out of bed.  In addition, he has loss of appetite.  And increased confusion.  1 month ago he was oriented to the month and year, and he is no longer oriented.  Over the past several weeks, the patient has been complaining of some severe low back pain, which is new.  Does have a history remotely of a lumbar fusion.  There is no history of fall or trauma.  He denies any associated numbness or tingling, or focal weakness.  2 weeks ago, he was seen by his neurologist, Dr. Clelia Croft, who initiated him on 100 mg daily of by gabapentin, which has not been helping.  In addition, he was seen 4 days ago by his PCP who decreased his Lasix due to hypotension and concerns of dehydration.  The patient has had not had any fever or chills, new cough, nausea vomiting or diarrhea; his last bowel movement was 1.5 days ago.  He has had decreased urination, concomitant with his decreased Lasix; however, he does describe burning.  Past Medical History:  Diagnosis Date  . Allergy   . Arrhythmia   . Arthritis   . Asthma   . Chicken pox   . Diverticulitis   . Emphysema of lung (HCC)   . Frequent headaches   . History of colon  polyps   . Hypertension     Patient Active Problem List   Diagnosis Date Noted  . Bradycardia 07/14/2017  . Preventative health care 09/13/2016  . Advance directive discussed with patient 09/13/2016  . Chronic combined systolic and diastolic heart failure (HCC) 10/06/2015  . Atrial paroxysmal tachycardia (HCC)   . Narrow complex tachycardia (HCC)   . Bilateral leg edema   . Memory loss   . Centrilobular emphysema (HCC)   . SVT (supraventricular tachycardia) (HCC) 08/04/2015  . BPH (benign prostatic hyperplasia) 10/20/2014  . Arthritis 09/09/2014  . Frequent headaches 09/09/2014  . Paroxysmal atrial fibrillation (HCC) 09/09/2014    Past Surgical History:  Procedure Laterality Date  . APPENDECTOMY    . BACK SURGERY  ~2011   Lumbar fusion  . TONSILLECTOMY      Current Outpatient Rx  . Order #: [id number] Class: Historical Med  . Order #: [id number] Class: Normal  . Order #: [id number] Class: Normal  . Order #: [id number] Class: Normal  . Order #: [id number] Class: Normal  . Order #: [id number] Class: Historical Med  . Order #: [id number] Class: Normal  . Order #: [id number] Class: Normal    Allergies Patient has no known allergies.  Family History  Problem Relation Age of Onset  . Arthritis Mother   . Hypertension Mother   . Hyperlipidemia Father   . Hypertension Father   .  Alcohol abuse Brother   . Cancer Brother        Lung    Social History Social History   Tobacco Use  . Smoking status: Former Smoker    Packs/day: 0.25    Years: 60.00    Pack years: 15.00    Types: Cigarettes    Last attempt to quit: 08/05/2014    Years since quitting: 3.7  . Smokeless tobacco: Former Neurosurgeon  . Tobacco comment: quit x 1 month  Substance Use Topics  . Alcohol use: No    Alcohol/week: 0.0 standard drinks  . Drug use: No    Review of Systems Constitutional: No fever/chills.  No lightheadedness or syncope.  No history of trauma.  Positive altered mental status. Eyes: No visual  changes. ENT: No sore throat. No congestion or rhinorrhea. Cardiovascular: Denies chest pain. Denies palpitations. Respiratory: Denies shortness of breath.  No new cough. Gastrointestinal: No abdominal pain.  No nausea, no vomiting.  No diarrhea.  No constipation. Genitourinary: Positive for dysuria.  Positive for decreased urination. Musculoskeletal: + low back pain.  Skin: Negative for rash. Neurological: Negative for headaches. No focal numbness, tingling or weakness.  No saddle anesthesia.  No fecal incontinence or retention.    ____________________________________________   PHYSICAL EXAM:  VITAL SIGNS: ED Triage Vitals  Enc Vitals Group     BP 05/16/2018 1452 105/84     Pulse Rate 05/09/2018 1452 95     Resp 05/28/2018 1452 15     Temp 06/02/2018 1452 97.6 F (36.4 C)     Temp Source 05/13/2018 1452 Oral     SpO2 05/21/2018 1450 96 %     Weight 05/09/2018 1446 190 lb (86.2 kg)     Height 06/01/2018 1446 6\' 2"  (1.88 m)     Head Circumference --      Peak Flow --      Pain Score 05/15/2018 1445 8     Pain Loc --      Pain Edu? --      Excl. in GC? --     Constitutional: The patient is alert and able to answer some questions.  He does not know the month or the year.  GCS is 15. Eyes: Conjunctivae are normal.  EOMI. PERRLA. no scleral icterus. Head: Atraumatic. Nose: No congestion/rhinnorhea. Mouth/Throat: Mucous membranes are dry.  Neck: No stridor.  Supple.  No midline C-spine tenderness to palpation, step-offs or deformities.  Full range of motion without pain. Cardiovascular: Fast rate, regular rhythm. No murmurs, rubs or gallops.  Respiratory: Normal respiratory effort.  No accessory muscle use or retractions. Lungs CTAB.  No wheezes, rales or ronchi. Gastrointestinal: Soft, nontender and nondistended.  No guarding or rebound.  No peritoneal signs. Musculoskeletal: No LE edema. No ttp in the calves or palpable cords.  No midline thoracic or lumbar spine tenderness to palpation,  step-offs or deformities.  The patient has an old incisional well-healed incision in the lumbar spine.  No evidence of trauma including ecchymosis. Neurologic: Alert and oriented 2. Speech is clear.  Face and smile symmetric. Tongue is midline.  EOMI.  PERRLA. 5 out of 5  hip flexors, plantar flexion and dorsiflexion.  No saddle anesthesia to light touch.  Normal sensation to light touch in the bilateral lower extremities.  Cerebellar testing is incomplete due to inability to comply with heel-to-shin or finger-nose-finger.. Skin:  Skin is warm, dry and intact. No rash noted. Psychiatric: Mood and affect are normal.   ____________________________________________  LABS (all labs ordered are listed, but only abnormal results are displayed)  Labs Reviewed  BASIC METABOLIC PANEL - Abnormal; Notable for the following components:      Result Value   Potassium 3.2 (*)    Glucose, Bld 130 (*)    BUN 28 (*)    Creatinine, Ser 1.37 (*)    Calcium 8.7 (*)    GFR calc non Af Amer 45 (*)    GFR calc Af Amer 52 (*)    All other components within normal limits  CBC - Abnormal; Notable for the following components:   RDW 15.8 (*)    All other components within normal limits  TROPONIN I - Abnormal; Notable for the following components:   Troponin I 0.03 (*)    All other components within normal limits  BRAIN NATRIURETIC PEPTIDE - Abnormal; Notable for the following components:   B Natriuretic Peptide 259.0 (*)    All other components within normal limits  HEPATIC FUNCTION PANEL - Abnormal; Notable for the following components:   Total Protein 6.1 (*)    Albumin 3.1 (*)    Bilirubin, Direct 0.4 (*)    All other components within normal limits  URINALYSIS, COMPLETE (UACMP) WITH MICROSCOPIC - Abnormal; Notable for the following components:   Color, Urine YELLOW (*)    APPearance CLEAR (*)    All other components within normal limits  BLOOD GAS, VENOUS - Abnormal; Notable for the following  components:   pH, Ven 7.45 (*)    Bicarbonate 35.4 (*)    Acid-Base Excess 9.7 (*)    All other components within normal limits  TROPONIN I - Abnormal; Notable for the following components:   Troponin I 0.03 (*)    All other components within normal limits  CULTURE, BLOOD (ROUTINE X 2)  CULTURE, BLOOD (ROUTINE X 2)  AMMONIA   ____________________________________________  EKG  ED ECG REPORT I, Anne-Caroline Sharma Covert, the attending physician, personally viewed and interpreted this ECG.   Date: 05/29/2018  EKG Time: 1450   Rate: 107    Rhythm: sinus tachycardia; RBBB  Axis: leftward  Intervals:none  ST&T Change: No STEMI  ____________________________________________  RADIOLOGY  Dg Chest 2 View  Result Date: May 29, 2018 CLINICAL DATA:  84 year old male with altered mental status. EXAM: CHEST - 2 VIEW COMPARISON:  Chest radiographs 02/24/2015. FINDINGS: Semi upright AP and lateral views. Chronic large lung volumes with increased AP dimension to the chest. Stable cardiomegaly and tortuous thoracic aorta. Other mediastinal contours are within normal limits. Visualized tracheal air column is within normal limits. No pleural effusion. No pneumothorax or pulmonary edema. Patchy multifocal right lung opacity is new. Questionable new left lung base patchy opacity. Osteopenia. Exaggerated thoracic kyphosis. No acute osseous abnormality identified. Negative visible bowel gas pattern. IMPRESSION: Patchy multifocal right lung opacity. In this clinical setting consider aspiration versus acute infectious exacerbation superimposed on chronic lung disease. No pleural effusion. Electronically Signed   By: Odessa Fleming M.D.   On: 05/29/18 15:58   Ct Head Wo Contrast  Result Date: 29-May-2018 CLINICAL DATA:  84 year old male with decreased urine output. Left flank pain. Altered mental status. Dementia. EXAM: CT HEAD WITHOUT CONTRAST TECHNIQUE: Contiguous axial images were obtained from the base of the skull  through the vertex without intravenous contrast. COMPARISON:  Cervical spine radiographs 03/08/2007. FINDINGS: Brain: Small left middle cranial fossa arachnoid cysts suspected (series 2 and image 12). No other extra-axial fluid. Cerebral volume is within normal limits for age. No midline shift,  ventriculomegaly, mass effect, evidence of solid mass lesion, intracranial hemorrhage or evidence of cortically based acute infarction. Mild for age nonspecific white matter hypodensity. No definite cortical encephalomalacia. Vascular: Calcified atherosclerosis at the skull base. No suspicious intracranial vascular hyperdensity. Skull: Negative. Sinuses/Orbits: Visualized paranasal sinuses and mastoids are clear. Other: No acute orbit or scalp soft tissue finding. IMPRESSION: 1. No acute intracranial abnormality. 2. Negative for age non contrast CT appearance of the brain; mild white matter changes and small left middle cranial fossa probable arachnoid cyst. Electronically Signed   By: Odessa Fleming M.D.   On: 06/03/2018 15:46   Mr Lumbar Spine Wo Contrast  Result Date: 06/02/2018 CLINICAL DATA:  Severe low back pain. One month of progressive decline. History of dementia and lumbar spine surgery. EXAM: MRI LUMBAR SPINE WITHOUT CONTRAST TECHNIQUE: Multiplanar, multisequence MR imaging of the lumbar spine was performed. No intravenous contrast was administered. COMPARISON:  MRI lumbar spine September 24, 2011 FINDINGS: Moderately motion degraded examination. SEGMENTATION: For the purposes of this report, the last well-formed intervertebral disc is reported as L5-S1. ALIGNMENT: Maintained lumbar lordosis. Stable grade 2 L4-5 anterolisthesis. Similar grade 1 L2-3 retrolisthesis. VERTEBRAE:T12 burst fracture with 50-75% height loss, diffusely low T1 and patchy bright STIR signal with 6 mm retropulsed bony fragments. Generally bright T1 bone marrow signal compatible with osteopenia. Diffuse disc desiccation with superimposed disc edema  L2-3 associated with moderate disc height loss and moderate acute discogenic endplate changes. Old Schmorl's nodes. L3 through L5 PLIF, hardware artifact. L5-S1 chronic arthrodesis. CONUS MEDULLARIS AND CAUDA EQUINA: Conus medullaris terminates at L1-2 and demonstrates normal morphology and signal characteristics. Cauda equina is normal. PARASPINAL AND OTHER SOFT TISSUES: Nonacute. Increased 6.5 cm infrarenal aortic aneurysm. Severe paraspinal muscle atrophy. DISC LEVELS: T11-12: Retropulsed bony fragments resulting in mild canal stenosis. Mild neural foraminal narrowing. T12-L1: Small suspected LEFT subarticular disc protrusion. No canal stenosis. Mild LEFT neural foraminal narrowing. L1-2: Annular bulging. Mild facet arthropathy. No canal stenosis or neural foraminal narrowing. L2-3: Retrolisthesis. Small broad-based disc bulge. Mild facet arthropathy. No canal stenosis. Mild-to-moderate RIGHT, mild LEFT neural foraminal narrowing. L3-4: PLIF and posterior decompression without canal stenosis or neural foraminal narrowing. L4-5: PLIF posterior decompression without canal stenosis. Anterolisthesis. Severe RIGHT and mild LEFT neural foraminal narrowing may be overestimated by hardware artifact, similar in appearance. L5-S1: No disc bulge. Mild facet arthropathy without canal stenosis or neural foraminal narrowing. IMPRESSION: 1. Moderately motion degraded examination. 2. Acute-subacute moderate to severe T12 burst fracture. Osteopenia. 3. L3 through L5 PLIF with worsening L2-3 adjacent segment disease. Grade 1 L2-3 retrolisthesis. Stable grade 2 L4-5 anterolisthesis. 4. Enlarged 6.5 cm infrarenal aortic aneurysm. Vascular surgery consultation recommended due to increased risk of rupture for AAA >5.5 cm. This recommendation follows ACR consensus guidelines: White Paper of the ACR Incidental Findings Committee II on Vascular Findings. J Am Coll Radiol 2013; 10:789-794. Electronically Signed   By: Awilda Metro  M.D.   On: 05/17/2018 19:12    ____________________________________________   PROCEDURES  Procedure(s) performed: None  Procedures  Critical Care performed: No ____________________________________________   INITIAL IMPRESSION / ASSESSMENT AND PLAN / ED COURSE  Pertinent labs & imaging results that were available during my care of the patient were reviewed by me and considered in my medical decision making (see chart for details).  84 y.o. male with a history of dementia, COPD, paroxysmal A. fib, presenting with one month of general decline, now more recently with low back pain, decreased urination in the setting of decrease  Lasix however with dysuria, and altered mental status with decreased ability to walk.  Overall, it is difficult to ascertain his chronic issues versus his acute medical issues.  We will check for UTI and dehydration, as well as decrease Lasix use for his decreased urination and possibly his altered mental status.  We will also do a postvoid residual to see if he is having urinary retention; he is not having any focal midline lumbar spine tenderness and has not been having any fecal incontinence or retention, so acute spinal cord compression is less likely.  However, given that he is walking less than usual due to severe pain, we will get an MRI of the lumbar spine for further evaluation.  Basic laboratory studies including sodium, potassium, blood sugar and creatinine, will also be obtained.  His EKG does not show any ischemic changes and a troponin is pending.  An ammonia and hepatic function panel are also pending.  At this time, he will receive intravenous fluids because clinically he does have evidence of dehydration with tachycardia and dry mucous membranes; he will also receive Tylenol for his pain.  Plan reevaluation for final disposition.  ----------------------------------------- 4:09 PM on 06/05/2018 -----------------------------------------  The patient's  chest x-ray does show a patchy multifocal right lung opacity; aspiration versus infectious etiology.  While we are waiting the results of his scans, blood cultures have been ordered and antibiotics will be hung.  He does not meet sepsis criteria at this time.  In addition, the patient does have an elevated BNP at 259; however, this is likely his baseline and he has had acute exacerbations greater than 1100 in the past so I will proceed with intravenous fluids for his dehydration.  He does have an acute on chronic renal insufficiency which corroborates his dry mucous membranes and lack of p.o. intake today.  Patient CT head does not show any additional abnormalities.  ----------------------------------------- 4:13 PM on 05/08/2018 -----------------------------------------  Spoke with the patient's NP with [facility] making house calls, who is his PCP, Tuesday was the first time he was ever seen.  She reports that most of his complaints, including changes in mental status, have been progressive over the last several months but that she did get a report he was worse today.  She was most concerned that he likely had some dehydration and possibly a UTI.  ED Course: I reviewed the patient's work-up, which shows an acute versus subacute T12 burst fractures and some degeneration around his prior PLIF surgical sites.  I spoke with Dr. Adriana Simas, the neurosurgeon on-call, who recommends a TLSO brace and he will come evaluate the patient in the morning.  No transfer is indicated at this time as the patient does not meet criteria for acute intervention surgically of his spine fracture.  At this time, the patient has become tachycardic to the 120s and hypotensive to the 90s over 70s.  He is still mentating at baseline, but we will give him intravenous fluids as I suspect this is a combination of dehydration and possibly his infection.  He will be closely monitored and admitted to the hospitalist for continued evaluation and  treatment at this time. ____________________________________________  FINAL CLINICAL IMPRESSION(S) / ED DIAGNOSES  Final diagnoses:  Acute on chronic renal insufficiency  Right middle lobe pulmonary infiltrate  Altered mental status, unspecified altered mental status type  Hypokalemia  T12 burst fracture (HCC)         NEW MEDICATIONS STARTED DURING THIS VISIT:  New Prescriptions  No medications on file      Rockne Menghini, MD 05/27/2018 1535    Rockne Menghini, MD 2018-05-27 1612    Rockne Menghini, MD 05/27/2018 1617    Rockne Menghini, MD 2018/05/27 2155

## 2018-05-14 NOTE — ED Notes (Signed)
Date and time results received: 06-05-2018 3:31 PM  (use smartphrase ".now" to insert current time)  Test: troponin Critical Value: 0.03  Name of Provider Notified: norman  Orders Received? Or Actions Taken?: no orders

## 2018-05-14 NOTE — ED Notes (Signed)
Patient transported to MRI 

## 2018-05-14 NOTE — ED Notes (Signed)
Patient back from CT.

## 2018-05-14 NOTE — ED Notes (Signed)
Patient transported to CT 

## 2018-05-14 NOTE — ED Notes (Signed)
Family taken coffee and sprite

## 2018-05-14 NOTE — ED Triage Notes (Signed)
Pt to ED via EMS from home. Per ems pts home health nurse called ems bc pt has had decreased urine output xfew days and trouble initiating. Pt c/o left flank pain x few weeks. Pt is on 3l chronic with hx of copd and dementia.

## 2018-05-15 ENCOUNTER — Encounter: Payer: Self-pay | Admitting: Internal Medicine

## 2018-05-15 DIAGNOSIS — J9601 Acute respiratory failure with hypoxia: Secondary | ICD-10-CM | POA: Diagnosis present

## 2018-05-15 DIAGNOSIS — I480 Paroxysmal atrial fibrillation: Secondary | ICD-10-CM | POA: Diagnosis present

## 2018-05-15 DIAGNOSIS — J90 Pleural effusion, not elsewhere classified: Secondary | ICD-10-CM | POA: Diagnosis not present

## 2018-05-15 DIAGNOSIS — I5023 Acute on chronic systolic (congestive) heart failure: Secondary | ICD-10-CM | POA: Diagnosis present

## 2018-05-15 DIAGNOSIS — Z79899 Other long term (current) drug therapy: Secondary | ICD-10-CM | POA: Diagnosis not present

## 2018-05-15 DIAGNOSIS — X58XXXA Exposure to other specified factors, initial encounter: Secondary | ICD-10-CM | POA: Diagnosis present

## 2018-05-15 DIAGNOSIS — Z66 Do not resuscitate: Secondary | ICD-10-CM | POA: Diagnosis present

## 2018-05-15 DIAGNOSIS — N401 Enlarged prostate with lower urinary tract symptoms: Secondary | ICD-10-CM | POA: Diagnosis present

## 2018-05-15 DIAGNOSIS — J440 Chronic obstructive pulmonary disease with acute lower respiratory infection: Secondary | ICD-10-CM | POA: Diagnosis present

## 2018-05-15 DIAGNOSIS — Z7401 Bed confinement status: Secondary | ICD-10-CM | POA: Diagnosis not present

## 2018-05-15 DIAGNOSIS — Z515 Encounter for palliative care: Secondary | ICD-10-CM | POA: Diagnosis not present

## 2018-05-15 DIAGNOSIS — J159 Unspecified bacterial pneumonia: Secondary | ICD-10-CM | POA: Diagnosis not present

## 2018-05-15 DIAGNOSIS — I130 Hypertensive heart and chronic kidney disease with heart failure and stage 1 through stage 4 chronic kidney disease, or unspecified chronic kidney disease: Secondary | ICD-10-CM | POA: Diagnosis present

## 2018-05-15 DIAGNOSIS — R05 Cough: Secondary | ICD-10-CM | POA: Diagnosis not present

## 2018-05-15 DIAGNOSIS — A419 Sepsis, unspecified organism: Secondary | ICD-10-CM | POA: Diagnosis present

## 2018-05-15 DIAGNOSIS — J690 Pneumonitis due to inhalation of food and vomit: Secondary | ICD-10-CM | POA: Diagnosis not present

## 2018-05-15 DIAGNOSIS — N179 Acute kidney failure, unspecified: Secondary | ICD-10-CM | POA: Diagnosis present

## 2018-05-15 DIAGNOSIS — I340 Nonrheumatic mitral (valve) insufficiency: Secondary | ICD-10-CM | POA: Diagnosis not present

## 2018-05-15 DIAGNOSIS — K5900 Constipation, unspecified: Secondary | ICD-10-CM | POA: Diagnosis present

## 2018-05-15 DIAGNOSIS — Z9181 History of falling: Secondary | ICD-10-CM | POA: Diagnosis not present

## 2018-05-15 DIAGNOSIS — J189 Pneumonia, unspecified organism: Secondary | ICD-10-CM | POA: Diagnosis present

## 2018-05-15 DIAGNOSIS — Z7951 Long term (current) use of inhaled steroids: Secondary | ICD-10-CM | POA: Diagnosis not present

## 2018-05-15 DIAGNOSIS — J441 Chronic obstructive pulmonary disease with (acute) exacerbation: Secondary | ICD-10-CM | POA: Diagnosis present

## 2018-05-15 DIAGNOSIS — S22081A Stable burst fracture of T11-T12 vertebra, initial encounter for closed fracture: Secondary | ICD-10-CM | POA: Diagnosis present

## 2018-05-15 DIAGNOSIS — R0600 Dyspnea, unspecified: Secondary | ICD-10-CM | POA: Diagnosis not present

## 2018-05-15 DIAGNOSIS — F0390 Unspecified dementia without behavioral disturbance: Secondary | ICD-10-CM | POA: Diagnosis present

## 2018-05-15 DIAGNOSIS — E8809 Other disorders of plasma-protein metabolism, not elsewhere classified: Secondary | ICD-10-CM | POA: Diagnosis present

## 2018-05-15 DIAGNOSIS — Z7982 Long term (current) use of aspirin: Secondary | ICD-10-CM | POA: Diagnosis not present

## 2018-05-15 DIAGNOSIS — E876 Hypokalemia: Secondary | ICD-10-CM | POA: Diagnosis present

## 2018-05-15 DIAGNOSIS — R6510 Systemic inflammatory response syndrome (SIRS) of non-infectious origin without acute organ dysfunction: Secondary | ICD-10-CM | POA: Diagnosis not present

## 2018-05-15 DIAGNOSIS — I509 Heart failure, unspecified: Secondary | ICD-10-CM | POA: Diagnosis not present

## 2018-05-15 DIAGNOSIS — J449 Chronic obstructive pulmonary disease, unspecified: Secondary | ICD-10-CM | POA: Diagnosis not present

## 2018-05-15 DIAGNOSIS — R338 Other retention of urine: Secondary | ICD-10-CM | POA: Diagnosis present

## 2018-05-15 DIAGNOSIS — Z7189 Other specified counseling: Secondary | ICD-10-CM | POA: Diagnosis not present

## 2018-05-15 DIAGNOSIS — R4182 Altered mental status, unspecified: Secondary | ICD-10-CM | POA: Diagnosis not present

## 2018-05-15 DIAGNOSIS — Z87891 Personal history of nicotine dependence: Secondary | ICD-10-CM | POA: Diagnosis not present

## 2018-05-15 LAB — FOLATE: Folate: 13.1 ng/mL (ref >5.9)

## 2018-05-15 LAB — INFLUENZA PANEL BY PCR (TYPE A & B)
Influenza A By PCR: NEGATIVE
Influenza B By PCR: NEGATIVE

## 2018-05-15 LAB — PROCALCITONIN: Procalcitonin: <0.1 ng/mL

## 2018-05-15 LAB — TSH: TSH: 0.619 u[IU]/mL (ref 0.350–4.500)

## 2018-05-15 LAB — VITAMIN B12: Vitamin B-12: 107 pg/mL — ABNORMAL LOW (ref 180–914)

## 2018-05-15 LAB — AMMONIA: Ammonia: 15 umol/L (ref 9–35)

## 2018-05-15 LAB — PHOSPHORUS: Phosphorus: 3 mg/dL (ref 2.5–4.6)

## 2018-05-15 LAB — MAGNESIUM: Magnesium: 2.1 mg/dL (ref 1.7–2.4)

## 2018-05-15 LAB — STREP PNEUMONIAE URINARY ANTIGEN: Strep Pneumo Urinary Antigen: NEGATIVE

## 2018-05-15 LAB — PREALBUMIN: Prealbumin: 13.9 mg/dL — ABNORMAL LOW (ref 18–38)

## 2018-05-15 MED ORDER — ACETAMINOPHEN 650 MG RE SUPP: 650.0000 mg | Freq: Four times a day (QID) | RECTAL | Status: DC | PRN

## 2018-05-15 MED ORDER — LORAZEPAM 2 MG/ML IJ SOLN
2.0000 mg | Freq: Once | INTRAMUSCULAR | Status: AC
  Administered 2018-05-15: 2 mg via INTRAVENOUS
  Filled 2018-05-15: qty 1

## 2018-05-15 MED ORDER — METHYLPREDNISOLONE SODIUM SUCC 125 MG IJ SOLR
60.0000 mg | Freq: Two times a day (BID) | INTRAMUSCULAR | Status: DC
  Administered 2018-05-15 – 2018-05-19 (×8): 60 mg via INTRAVENOUS
  Filled 2018-05-15 (×8): qty 2

## 2018-05-15 MED ORDER — TAMSULOSIN HCL 0.4 MG PO CAPS
0.4000 mg | ORAL_CAPSULE | Freq: Every day | ORAL | Status: DC
  Administered 2018-05-15 – 2018-05-18 (×4): 0.4 mg via ORAL
  Filled 2018-05-15 (×5): qty 1

## 2018-05-15 MED ORDER — METHYLPREDNISOLONE SODIUM SUCC 125 MG IJ SOLR
60.0000 mg | Freq: Two times a day (BID) | INTRAMUSCULAR | Status: DC
  Administered 2018-05-15: 60 mg via INTRAVENOUS
  Filled 2018-05-15: qty 2

## 2018-05-15 MED ORDER — PREDNISONE 20 MG PO TABS: 40.0000 mg | ORAL_TABLET | Freq: Every day | ORAL | Status: DC

## 2018-05-15 MED ORDER — ASPIRIN EC 81 MG PO TBEC
81.0000 mg | DELAYED_RELEASE_TABLET | Freq: Every day | ORAL | Status: DC
  Administered 2018-05-15 – 2018-05-18 (×4): 81 mg via ORAL
  Filled 2018-05-15 (×4): qty 1

## 2018-05-15 MED ORDER — LORAZEPAM 2 MG/ML IJ SOLN
INTRAMUSCULAR | Status: AC
  Administered 2018-05-15: 2 mg
  Filled 2018-05-15: qty 1

## 2018-05-15 MED ORDER — SODIUM CHLORIDE 0.9 % IV SOLN
500.0000 mg | INTRAVENOUS | Status: DC
  Administered 2018-05-15: 500 mg via INTRAVENOUS
  Filled 2018-05-15 (×2): qty 500

## 2018-05-15 MED ORDER — TIOTROPIUM BROMIDE MONOHYDRATE 18 MCG IN CAPS
18.0000 ug | ORAL_CAPSULE | Freq: Every day | RESPIRATORY_TRACT | Status: DC
  Filled 2018-05-15 (×2): qty 5

## 2018-05-15 MED ORDER — POTASSIUM CHLORIDE 10 MEQ/100ML IV SOLN
10.0000 meq | INTRAVENOUS | Status: AC
  Administered 2018-05-15: 10 meq via INTRAVENOUS
  Filled 2018-05-15 (×4): qty 100

## 2018-05-15 MED ORDER — IPRATROPIUM-ALBUTEROL 0.5-2.5 (3) MG/3ML IN SOLN
3.0000 mL | RESPIRATORY_TRACT | Status: DC
  Administered 2018-05-15 (×3): 3 mL via RESPIRATORY_TRACT
  Filled 2018-05-15 (×3): qty 3

## 2018-05-15 MED ORDER — SENNOSIDES-DOCUSATE SODIUM 8.6-50 MG PO TABS: 1.0000 | ORAL_TABLET | Freq: Every evening | ORAL | Status: DC | PRN

## 2018-05-15 MED ORDER — FUROSEMIDE 40 MG PO TABS
80.0000 mg | ORAL_TABLET | Freq: Every day | ORAL | Status: DC
  Administered 2018-05-15 – 2018-05-16 (×2): 80 mg via ORAL
  Filled 2018-05-15 (×2): qty 2

## 2018-05-15 MED ORDER — ALBUTEROL SULFATE (2.5 MG/3ML) 0.083% IN NEBU
2.5000 mg | INHALATION_SOLUTION | Freq: Four times a day (QID) | RESPIRATORY_TRACT | Status: DC | PRN
  Administered 2018-05-15 – 2018-05-17 (×2): 2.5 mg via RESPIRATORY_TRACT
  Filled 2018-05-15 (×2): qty 3

## 2018-05-15 MED ORDER — POTASSIUM CHLORIDE 10 MEQ/100ML IV SOLN
10.0000 meq | INTRAVENOUS | Status: AC
  Administered 2018-05-15 (×2): 10 meq via INTRAVENOUS

## 2018-05-15 MED ORDER — ONDANSETRON HCL 4 MG/2ML IJ SOLN: 4.0000 mg | Freq: Four times a day (QID) | INTRAMUSCULAR | Status: DC | PRN

## 2018-05-15 MED ORDER — AMIODARONE HCL 200 MG PO TABS
200.0000 mg | ORAL_TABLET | Freq: Every day | ORAL | Status: DC
  Administered 2018-05-16 – 2018-05-18 (×3): 200 mg via ORAL
  Filled 2018-05-15 (×2): qty 2
  Filled 2018-05-15 (×2): qty 1
  Filled 2018-05-15: qty 2

## 2018-05-15 MED ORDER — IPRATROPIUM-ALBUTEROL 0.5-2.5 (3) MG/3ML IN SOLN
3.0000 mL | Freq: Four times a day (QID) | RESPIRATORY_TRACT | Status: DC
  Administered 2018-05-16 – 2018-05-19 (×13): 3 mL via RESPIRATORY_TRACT
  Filled 2018-05-15 (×14): qty 3

## 2018-05-15 MED ORDER — ONDANSETRON HCL 4 MG PO TABS: 4.0000 mg | ORAL_TABLET | Freq: Four times a day (QID) | ORAL | Status: DC | PRN

## 2018-05-15 MED ORDER — CLINDAMYCIN PHOSPHATE 600 MG/50ML IV SOLN
600.0000 mg | Freq: Three times a day (TID) | INTRAVENOUS | Status: DC
  Administered 2018-05-15 – 2018-05-16 (×5): 600 mg via INTRAVENOUS
  Filled 2018-05-15 (×8): qty 50

## 2018-05-15 MED ORDER — RISPERIDONE 0.5 MG PO TABS
0.5000 mg | ORAL_TABLET | Freq: Two times a day (BID) | ORAL | Status: DC
  Administered 2018-05-16 – 2018-05-18 (×5): 0.5 mg via ORAL
  Filled 2018-05-15 (×9): qty 1

## 2018-05-15 MED ORDER — SODIUM CHLORIDE 0.9 % IV SOLN
1.0000 g | INTRAVENOUS | Status: DC
  Administered 2018-05-15 – 2018-05-16 (×2): 1 g via INTRAVENOUS
  Filled 2018-05-15: qty 1
  Filled 2018-05-15 (×2): qty 10

## 2018-05-15 MED ORDER — ENOXAPARIN SODIUM 40 MG/0.4ML ~~LOC~~ SOLN
40.0000 mg | SUBCUTANEOUS | Status: DC
  Administered 2018-05-15 – 2018-05-18 (×4): 40 mg via SUBCUTANEOUS
  Filled 2018-05-15 (×5): qty 0.4

## 2018-05-15 MED ORDER — ACETAMINOPHEN 325 MG PO TABS
650.0000 mg | ORAL_TABLET | Freq: Four times a day (QID) | ORAL | Status: DC | PRN
  Administered 2018-05-15 – 2018-05-16 (×2): 650 mg via ORAL
  Filled 2018-05-15 (×2): qty 2

## 2018-05-15 MED ORDER — BISACODYL 5 MG PO TBEC
5.0000 mg | DELAYED_RELEASE_TABLET | Freq: Every day | ORAL | Status: DC | PRN
  Filled 2018-05-15: qty 1

## 2018-05-15 MED ORDER — GABAPENTIN 100 MG PO CAPS
100.0000 mg | ORAL_CAPSULE | Freq: Two times a day (BID) | ORAL | Status: DC
  Administered 2018-05-16 – 2018-05-18 (×5): 100 mg via ORAL
  Filled 2018-05-15 (×9): qty 1

## 2018-05-15 MED ORDER — METHYLPREDNISOLONE SODIUM SUCC 125 MG IJ SOLR
125.0000 mg | Freq: Once | INTRAMUSCULAR | Status: AC
  Administered 2018-05-15: 125 mg via INTRAVENOUS
  Filled 2018-05-15: qty 2

## 2018-05-15 NOTE — ED Notes (Signed)
1:1 sitter remains at pt bedside.  

## 2018-05-15 NOTE — ED Notes (Signed)
Pt in and out of sleep. Sitter attempted to feed him and give him some juice, but pt not able to chew well or grasp the concept of drinking through a straw. Not able to give him meds at this time because of this.

## 2018-05-15 NOTE — ED Notes (Signed)
[phone] Kage Dull (daughter) wants to know what room he gets assigned to

## 2018-05-15 NOTE — ED Notes (Signed)
Pt became agitated, trying to pull his foley catheter. Pt tried to stand and fight the staff. Dr. Pershing Proud came to the room to help get the Pt back in the bed and ordered 2mg  of Ativan to calm the Pt. Ativan was given.

## 2018-05-15 NOTE — Progress Notes (Signed)
CODE SEPSIS - PHARMACY COMMUNICATION  **Broad Spectrum Antibiotics should be administered within 1 hour of Sepsis diagnosis**  Time Code Sepsis Called/Page Received: n/a  Antibiotics Ordered: azithro/ceftriaxone  Time of 1st antibiotic administration: 0515  Additional action taken by pharmacy:   If necessary, Name of Provider/Nurse Contacted:     Thomasene Ripple ,PharmD Clinical Pharmacist  05/15/2018  7:07 AM

## 2018-05-15 NOTE — ED Notes (Signed)
Pt resting quietly at this time with eyes closed, EDT sitting next to patient for safety. Pt has hands in mittens.  VSS.  1st run of potassium infusing. Pt lying on right side, respirations equal and unlabored.

## 2018-05-15 NOTE — ED Notes (Signed)
Pt has 2 skin tears to left arm from scratching himself and swinging arms against side rails. Wounds have been dressed. Side rails have been padded and hand mitts have been applied to patients hands for safety. MD aware.

## 2018-05-15 NOTE — H&P (Signed)
[facility] [place] at [facility]   PATIENT NAME: [name]    MR#:  [id number]  DATE OF BIRTH:  23-Aug-1925  DATE OF ADMISSION:  06/01/2018  PRIMARY CARE PHYSICIAN: [facility]   REQUESTING/REFERRING PHYSICIAN: Rockne Menghini, MD  CHIEF COMPLAINT:   Chief Complaint  Patient presents with  . Urinary Retention    HISTORY OF PRESENT ILLNESS:  [name]  is a 84 y.o. male with a known history of COPD (3L [place] home O2), HTN, BPH, Afib (no AC), dementia p/w AMS. Not oriented, cannot provide Hx/ROS. Has dementia but has been deteriorating x37mo, poor ambulation, largely bedbound x3wks. CXR demonstrates R lung opacity. SIRS (+), sepsis 2/2 pneumonia (ABCAP vs. aspiration). Suspect pt may be near/at end of life.  PAST MEDICAL HISTORY:   Past Medical History:  Diagnosis Date  . Allergy   . Arrhythmia   . Arthritis   . Asthma   . Chicken pox   . Diverticulitis   . Emphysema of lung (HCC)   . Frequent headaches   . History of colon polyps   . Hypertension     PAST SURGICAL HISTORY:   Past Surgical History:  Procedure Laterality Date  . APPENDECTOMY    . BACK SURGERY  ~2011   Lumbar fusion  . TONSILLECTOMY      SOCIAL HISTORY:   Social History   Tobacco Use  . Smoking status: Former Smoker    Packs/day: 0.25    Years: 60.00    Pack years: 15.00    Types: Cigarettes    Last attempt to quit: 08/05/2014    Years since quitting: 3.7  . Smokeless tobacco: Former Neurosurgeon  . Tobacco comment: quit x 1 month  Substance Use Topics  . Alcohol use: No    Alcohol/week: 0.0 standard drinks    FAMILY HISTORY:   Family History  Problem Relation Age of Onset  . Arthritis Mother   . Hypertension Mother   . Hyperlipidemia Father   . Hypertension Father   . Alcohol abuse Brother   . Cancer Brother        Lung    DRUG ALLERGIES:  No Known Allergies  REVIEW OF SYSTEMS:   Review of Systems  Unable to perform ROS: Dementia    Respiratory: Positive for shortness of breath.   Genitourinary: Positive for dysuria.  Musculoskeletal: Positive for back pain.   Dementia + AMS. Reportedly c/o decreased urination, burning on urination, low back pain and SOB in ED. MEDICATIONS AT HOME:   Prior to Admission medications   Medication Sig Start Date End Date Taking? Authorizing Provider  acetaminophen (TYLENOL) 325 MG tablet Take 650 mg by mouth every 6 (six) hours as needed for moderate pain.    Yes [provider]  albuterol (PROVENTIL HFA;VENTOLIN HFA) 108 (90 Base) MCG/ACT inhaler INHALE 2 PUFFS BY MOUTH EVERY 6 HOURS IF NEEDED IF NEEDED FOR SHORTNESS OF BREATH Patient taking differently: Inhale 2 puffs into the lungs every 6 (six) hours as needed for shortness of breath.  01/01/18  Yes Karie Schwalbe, MD  amiodarone (PACERONE) 200 MG tablet Take 1 tablet (200 mg total) by mouth daily. 04/30/18  Yes Duke Salvia, MD  ASPIRIN LOW DOSE 81 MG EC tablet TAKE 1 TABLET BY MOUTH ONCE DAILY Patient taking differently: Take 81 mg by mouth daily.  04/09/18  Yes Karie Schwalbe, MD  furosemide (LASIX) 80 MG tablet Take 1 tablet (80 mg total) by mouth daily.  Patient taking differently: Take 40 mg by mouth 2 (two) times daily.  05/07/18 08/05/18 Yes Duke Salvia, MD  gabapentin (NEURONTIN) 100 MG capsule Take 100 mg by mouth See admin instructions. Take 1 capsule (100MG ) by mouth nightly for 1 week then take 1 capsule (100MG ) by mouth twice daily 05/08/18  Yes [provider]  SPIRIVA HANDIHALER 18 MCG inhalation capsule INHALE THE CONTENTS OF ONE CAPSULE IN THE HANDIHALER ONCE DAILY Patient taking differently: Place 18 mcg into inhaler and inhale daily.  10/31/17  Yes Karie Schwalbe, MD  tamsulosin (FLOMAX) 0.4 MG CAPS capsule TAKE ONE CAPSULE BY MOUTH DAILY Patient taking differently: Take 0.4 mg by mouth daily.  11/06/17  Yes Karie Schwalbe, MD      VITAL SIGNS:  Blood pressure (!) 112/91, pulse (!) 122,  temperature 97.6 F (36.4 C), temperature source Oral, resp. rate (!) 31, height 6\' 2"  (1.88 m), weight 86.2 kg, SpO2 95 %.  PHYSICAL EXAMINATION:  Physical Exam  Constitutional: He is active and uncooperative. He has a sickly appearance. He appears ill. He appears distressed (+) respiratory distress. He is not intubated. Nasal cannula in place.  HENT:  Head: Atraumatic.  Mouth/Throat: Oropharynx is clear and moist. No oropharyngeal exudate.  Eyes: Conjunctivae, EOM and lids are normal. No scleral icterus.  Neck: Neck supple. No JVD present. No thyromegaly present.  Cardiovascular: S1 normal and S2 normal. An irregularly irregular rhythm present. Tachycardia present. Exam reveals no gallop, no S3, no S4, no distant heart sounds and no friction rub.  No murmur heard. Pulmonary/Chest: No accessory muscle usage or stridor. Tachypnea noted. No apnea and no bradypnea. He is not intubated. He is in respiratory distress. He has decreased breath sounds in the right upper field, the right middle field, the right lower field, the left upper field, the left middle field and the left lower field. He has no wheezes. He has rhonchi in the right upper field, the right middle field, the right lower field, the left upper field, the left middle field and the left lower field. He has no rales.  Abdominal: Soft. He exhibits no distension. Bowel sounds are decreased. There is no tenderness. There is no rigidity, no rebound and no guarding.  Musculoskeletal: Normal range of motion. He exhibits no edema or tenderness.  Lymphadenopathy:    He has no cervical adenopathy.  Neurological: He is alert. He is disoriented.  (+) Dementia/AMS.  Skin: Skin is warm and dry. No rash noted. No erythema.  Psychiatric: He is agitated. He is not aggressive and not combative. Cognition and memory are impaired. He expresses impulsivity. He is noncommunicative.  (+) Dementia/AMS. He is inattentive.   Awake/active but  altered/disoriented and agitated/uncooperative at the time of my assessment. Not aggressive or combative. Restless. Diffuse coarse rhonchi. LABORATORY PANEL:   CBC Recent Labs  Lab May 17, 2018 1456  WBC 8.7  HGB 13.9  HCT 43.3  PLT 208   ------------------------------------------------------------------------------------------------------------------  Chemistries  Recent Labs  Lab 05/17/18 1456 17-May-2018 1528  NA 138  --   K 3.2*  --   CL 100  --   CO2 30  --   GLUCOSE 130*  --   BUN 28*  --   CREATININE 1.37*  --   CALCIUM 8.7*  --   AST  --  19  ALT  --  13  ALKPHOS  --  104  BILITOT  --  1.1   ------------------------------------------------------------------------------------------------------------------  Cardiac Enzymes Recent Labs  Lab  2018-06-05 1901  TROPONINI 0.03*   ------------------------------------------------------------------------------------------------------------------  RADIOLOGY:  Dg Chest 2 View  Result Date: 2018-06-05 CLINICAL DATA:  84 year old male with altered mental status. EXAM: CHEST - 2 VIEW COMPARISON:  Chest radiographs 02/24/2015. FINDINGS: Semi upright AP and lateral views. Chronic large lung volumes with increased AP dimension to the chest. Stable cardiomegaly and tortuous thoracic aorta. Other mediastinal contours are within normal limits. Visualized tracheal air column is within normal limits. No pleural effusion. No pneumothorax or pulmonary edema. Patchy multifocal right lung opacity is new. Questionable new left lung base patchy opacity. Osteopenia. Exaggerated thoracic kyphosis. No acute osseous abnormality identified. Negative visible bowel gas pattern. IMPRESSION: Patchy multifocal right lung opacity. In this clinical setting consider aspiration versus acute infectious exacerbation superimposed on chronic lung disease. No pleural effusion. Electronically Signed   By: Odessa Fleming M.D.   On: 06-05-18 15:58   Ct Head Wo  Contrast  Result Date: 2018-06-05 CLINICAL DATA:  84 year old male with decreased urine output. Left flank pain. Altered mental status. Dementia. EXAM: CT HEAD WITHOUT CONTRAST TECHNIQUE: Contiguous axial images were obtained from the base of the skull through the vertex without intravenous contrast. COMPARISON:  Cervical spine radiographs 03/08/2007. FINDINGS: Brain: Small left middle cranial fossa arachnoid cysts suspected (series 2 and image 12). No other extra-axial fluid. Cerebral volume is within normal limits for age. No midline shift, ventriculomegaly, mass effect, evidence of solid mass lesion, intracranial hemorrhage or evidence of cortically based acute infarction. Mild for age nonspecific white matter hypodensity. No definite cortical encephalomalacia. Vascular: Calcified atherosclerosis at the skull base. No suspicious intracranial vascular hyperdensity. Skull: Negative. Sinuses/Orbits: Visualized paranasal sinuses and mastoids are clear. Other: No acute orbit or scalp soft tissue finding. IMPRESSION: 1. No acute intracranial abnormality. 2. Negative for age non contrast CT appearance of the brain; mild white matter changes and small left middle cranial fossa probable arachnoid cyst. Electronically Signed   By: Odessa Fleming M.D.   On: 06/05/2018 15:46   Mr Lumbar Spine Wo Contrast  Result Date: June 05, 2018 CLINICAL DATA:  Severe low back pain. One month of progressive decline. History of dementia and lumbar spine surgery. EXAM: MRI LUMBAR SPINE WITHOUT CONTRAST TECHNIQUE: Multiplanar, multisequence MR imaging of the lumbar spine was performed. No intravenous contrast was administered. COMPARISON:  MRI lumbar spine September 24, 2011 FINDINGS: Moderately motion degraded examination. SEGMENTATION: For the purposes of this report, the last well-formed intervertebral disc is reported as L5-S1. ALIGNMENT: Maintained lumbar lordosis. Stable grade 2 L4-5 anterolisthesis. Similar grade 1 L2-3 retrolisthesis.  VERTEBRAE:T12 burst fracture with 50-75% height loss, diffusely low T1 and patchy bright STIR signal with 6 mm retropulsed bony fragments. Generally bright T1 bone marrow signal compatible with osteopenia. Diffuse disc desiccation with superimposed disc edema L2-3 associated with moderate disc height loss and moderate acute discogenic endplate changes. Old Schmorl's nodes. L3 through L5 PLIF, hardware artifact. L5-S1 chronic arthrodesis. CONUS MEDULLARIS AND CAUDA EQUINA: Conus medullaris terminates at L1-2 and demonstrates normal morphology and signal characteristics. Cauda equina is normal. PARASPINAL AND OTHER SOFT TISSUES: Nonacute. Increased 6.5 cm infrarenal aortic aneurysm. Severe paraspinal muscle atrophy. DISC LEVELS: T11-12: Retropulsed bony fragments resulting in mild canal stenosis. Mild neural foraminal narrowing. T12-L1: Small suspected LEFT subarticular disc protrusion. No canal stenosis. Mild LEFT neural foraminal narrowing. L1-2: Annular bulging. Mild facet arthropathy. No canal stenosis or neural foraminal narrowing. L2-3: Retrolisthesis. Small broad-based disc bulge. Mild facet arthropathy. No canal stenosis. Mild-to-moderate RIGHT, mild LEFT neural foraminal narrowing. L3-4: PLIF and  posterior decompression without canal stenosis or neural foraminal narrowing. L4-5: PLIF posterior decompression without canal stenosis. Anterolisthesis. Severe RIGHT and mild LEFT neural foraminal narrowing may be overestimated by hardware artifact, similar in appearance. L5-S1: No disc bulge. Mild facet arthropathy without canal stenosis or neural foraminal narrowing. IMPRESSION: 1. Moderately motion degraded examination. 2. Acute-subacute moderate to severe T12 burst fracture. Osteopenia. 3. L3 through L5 PLIF with worsening L2-3 adjacent segment disease. Grade 1 L2-3 retrolisthesis. Stable grade 2 L4-5 anterolisthesis. 4. Enlarged 6.5 cm infrarenal aortic aneurysm. Vascular surgery consultation recommended due to  increased risk of rupture for AAA >5.5 cm. This recommendation follows ACR consensus guidelines: White Paper of the ACR Incidental Findings Committee II on Vascular Findings. J Am Coll Radiol 2013; 10:789-794. Electronically Signed   By: Awilda Metro M.D.   On: 2018/05/18 19:12   IMPRESSION AND PLAN:   A/P: 16M AMS, SOB, acute hypoxemic respiratory failure, acute COPD exacerbation. CXR (+) R lung opacity. SIRS (+), sepsis 2/2 pneumonia (ABCAP vs. Aspiration). Hypokalemia, hyperglycemia, Cr elevation/CKD III, hypocalcemia, hypoalbuminemia. -AMS, SOB, acute hypoxemic respiratory failure, acute COPD exacerbation, SIRS, pneumonia, sepsis: Disoriented. (+) coarse rhonchi. Nebs, steroids. IS, pulmonary toileting as tolerated. SIRS (+). CXR (+) R lung opacity. Sepsis 2/2 pneumonia (ABCAP vs. Aspiration). Ceftriaxone + Azithromycin + Clindamycin. Cx, UAg. U/A (-) UTI. PCT low. TSH, B12, folate, ammonia, RPR, HIV pending. CT head (-). -Hypokalemia: Replete. Mag pending. -Cr elevation, CKD III: Cr at baseline. Monitor BMP, avoid nephrotoxins. -Hypocalcemia: Ionized calcium. -Hypoalbuminemia: Prealbumin. -c/w home meds/formulary subs. -FEN/GI: Cardiac diet as tolerated. -DVT PPx: Lovenox. -Code status: DNR/DNI. -Disposition: Admission, > 2 midnights.   All the records are reviewed and case discussed with ED provider. Management plans discussed with the patient, family and they are in agreement.  CODE STATUS: DNR/DNI.  TOTAL TIME TAKING CARE OF THIS PATIENT: 75 minutes.    Barbaraann Rondo M.D on 05/15/2018 at 4:08 AM  Between 7am to 6pm - Pager - [phone]  After 6pm go to [email or web address] - Social research officer, government  [facility]  Office  [phone]  CC: Primary care physician; [facility]   Note: This dictation was prepared with Dragon dictation along with smaller phrase technology. Any transcriptional errors that result from this process are  unintentional.

## 2018-05-15 NOTE — ED Notes (Signed)
Wife and daughter are leaving the bedside for the evening, Charge RN notified and 1:1 sitter at bedside for patient safety.

## 2018-05-15 NOTE — ED Notes (Signed)
Second bag of potassium started, pt not tolerating at higher rate at this time. Infusing at 45ml/hr

## 2018-05-15 NOTE — ED Notes (Signed)
1:1 sitter remains at pt bedside.

## 2018-05-15 NOTE — Progress Notes (Addendum)
Admitted overnight.  Seen and examined by me this morning.  Patient remains altered and appears somewhat agitated.  Does not open eyes or follow commands.  On exam, he has diminished breath sounds throughout all lung fields, with diffuse rhonchi.  3 L O2 by nasal cannula.  -Agree with admission H&P -Continue ceftriaxone, clindamycin, and azithromycin for community-acquired pneumonia versus aspiration -Continue duo nebs and albuterol as needed -Continue home inhalers -IV solumedrol -Repeat chest x-ray in the morning -Palliative care consult for goals of care discussion  Willadean Carol, MD [facility]

## 2018-05-15 NOTE — ED Notes (Signed)
Pt resting at this time. Sitter present.

## 2018-05-15 NOTE — ED Notes (Signed)
Bladder scan performed due to pts ongoing c/o of need to urinate while foley placed, 27ml found in bladder, foley flushed, pt repositioned, call bell within reach.

## 2018-05-15 NOTE — ED Notes (Addendum)
Sitter present with pt. Report to be called in 5 mins as nurse was busy.

## 2018-05-16 ENCOUNTER — Inpatient Hospital Stay: Payer: Medicare Other

## 2018-05-16 ENCOUNTER — Inpatient Hospital Stay (HOSPITAL_COMMUNITY)
Admit: 2018-05-16 | Discharge: 2018-05-16 | Disposition: A | Discharge status: Home / Self Care | Payer: Medicare Other | Attending: Internal Medicine | Admitting: Internal Medicine

## 2018-05-16 DIAGNOSIS — I340 Nonrheumatic mitral (valve) insufficiency: Secondary | ICD-10-CM

## 2018-05-16 LAB — CBC
HCT: 39.9 % (ref 39.0–52.0)
Hemoglobin: 12.9 g/dL — ABNORMAL LOW (ref 13.0–17.0)
MCH: 32.6 pg (ref 26.0–34.0)
MCHC: 32.3 g/dL (ref 30.0–36.0)
MCV: 100.8 fL — ABNORMAL HIGH (ref 80.0–100.0)
Platelets: 208 10*3/uL (ref 150–400)
RBC: 3.96 MIL/uL — ABNORMAL LOW (ref 4.22–5.81)
RDW: 15.6 % — ABNORMAL HIGH (ref 11.5–15.5)
WBC: 8.5 10*3/uL (ref 4.0–10.5)
nRBC: 0 % (ref 0.0–0.2)

## 2018-05-16 LAB — BASIC METABOLIC PANEL
ANION GAP: 8 (ref 5–15)
BUN: 24 mg/dL — ABNORMAL HIGH (ref 8–23)
CALCIUM: 8.6 mg/dL — AB (ref 8.9–10.3)
CO2: 26 mmol/L (ref 22–32)
Chloride: 103 mmol/L (ref 98–111)
Creatinine, Ser: 1.09 mg/dL (ref 0.61–1.24)
GFR calc Af Amer: >60 mL/min (ref >60)
GFR calc non Af Amer: 59 mL/min — ABNORMAL LOW (ref >60)
Glucose, Bld: 154 mg/dL — ABNORMAL HIGH (ref 70–99)
Potassium: 3.9 mmol/L (ref 3.5–5.1)
Sodium: 137 mmol/L (ref 135–145)

## 2018-05-16 LAB — CALCIUM, IONIZED: Calcium, Ionized, Serum: 4.6 mg/dL (ref 4.5–5.6)

## 2018-05-16 LAB — LEGIONELLA PNEUMOPHILA SEROGP 1 UR AG: L. PNEUMOPHILA SEROGP 1 UR AG: NEGATIVE

## 2018-05-16 LAB — RPR: RPR Ser Ql: NONREACTIVE

## 2018-05-16 MED ORDER — METOPROLOL TARTRATE 5 MG/5ML IV SOLN
5.0000 mg | Freq: Four times a day (QID) | INTRAVENOUS | Status: DC | PRN
  Administered 2018-05-17 – 2018-05-19 (×3): 5 mg via INTRAVENOUS
  Filled 2018-05-16 (×3): qty 5

## 2018-05-16 MED ORDER — HALOPERIDOL LACTATE 5 MG/ML IJ SOLN
2.0000 mg | Freq: Four times a day (QID) | INTRAMUSCULAR | Status: DC | PRN
  Administered 2018-05-16 – 2018-05-17 (×2): 2 mg via INTRAVENOUS
  Filled 2018-05-16 (×2): qty 1

## 2018-05-16 MED ORDER — FUROSEMIDE 40 MG PO TABS
40.0000 mg | ORAL_TABLET | Freq: Two times a day (BID) | ORAL | Status: DC
  Administered 2018-05-17: 40 mg via ORAL
  Filled 2018-05-16: qty 1

## 2018-05-16 MED ORDER — OLANZAPINE 10 MG IM SOLR
5.0000 mg | Freq: Once | INTRAMUSCULAR | Status: DC
  Filled 2018-05-16: qty 10

## 2018-05-16 NOTE — Progress Notes (Signed)
No charge note:   Palliative medicine  Thank you for this consult. Chart reviewed. GOC meeting arranged for tomorrow morning at 10am with patient's daughter- [name].   Ocie Bob, AGNP-C Palliative Medicine  Please call Palliative Medicine team phone with any questions [phone]. For individual providers please see AMION.

## 2018-05-16 NOTE — Progress Notes (Signed)
Notified MD of changing chest x-ray to portable due to pt's mental status.

## 2018-05-16 NOTE — Progress Notes (Signed)
[facility] - [place] at [facility]   PATIENT NAME: [name]    MR#:  [id number]  DATE OF BIRTH:  August 12, 1925  SUBJECTIVE:   Doing better this morning.  Has been more alert and able to eat and drink on his own.  States that his shortness of breath is better.  Also notes that he "is ready to die".  REVIEW OF SYSTEMS:  ROS-unable to obtain due to confusion  DRUG ALLERGIES:  No Known Allergies VITALS:  Blood pressure 109/81, pulse (!) 55, temperature 97.8 F (36.6 C), temperature source Oral, resp. rate 15, height 6\' 2"  (1.88 m), weight 86.2 kg, SpO2 95 %. PHYSICAL EXAMINATION:  Physical Exam  Constitutional:  Sitting up in bed in no acute distress. HEENT: Normocephalic, atraumatic, EOMI, no scleral icterus, moist mucous membranes Neck: Normal, full range of motion Cardiovascular: RRR. No murmurs, rubs or gallops.  Respiratory: + Diffuse rhonchi, normal work of breathing, nasal cannula in place Gastrointestinal: Soft, nontender and nondistended.  No guarding or rebound.  No peritoneal signs. Musculoskeletal: No LE edema.  No cyanosis or clubbing. Neurologic: Alert and oriented x2.  Able to move all extremities.+ Global weakness.  No focal deficits, sensation intact throughout. Skin:  Skin is warm, dry and intact. No rash noted. Psychiatric: Mood and affect are normal.  LABORATORY PANEL:  Male CBC Recent Labs  Lab 05/16/18 0606  WBC 8.5  HGB 12.9*  HCT 39.9  PLT 208   ------------------------------------------------------------------------------------------------------------------ Chemistries  Recent Labs  Lab 05/11/2018 1528 05/15/18 0848 05/16/18 0606  NA  --   --  137  K  --   --  3.9  CL  --   --  103  CO2  --   --  26  GLUCOSE  --   --  154*  BUN  --   --  24*  CREATININE  --   --  1.09  CALCIUM  --   --  8.6*  MG  --  2.1  --   AST 19  --   --   ALT 13  --   --   ALKPHOS 104  --   --   BILITOT 1.1  --   --    RADIOLOGY:  Dg Chest  1 View  Result Date: 05/16/2018 CLINICAL DATA:  Dyspnea. EXAM: CHEST  1 VIEW COMPARISON:  Radiograph May 14, 2018. FINDINGS: Stable cardiomediastinal silhouette. Atherosclerosis of thoracic aorta is noted. No pneumothorax is noted. Patchy right lung opacities noted on prior exam are resolved. Mild interstitial densities are noted throughout both lungs which may represent scarring, but superimposed edema or inflammation cannot be excluded. Minimal pleural effusions may be present. Bony thorax is unremarkable. IMPRESSION: Patchy right lung opacities noted on prior exam have resolved. Mild interstitial densities are noted throughout both lungs which may represent chronic interstitial lung disease, but acute superimposed edema or inflammation cannot be excluded. Minimal pleural effusions are noted. Electronically Signed   By: Lupita Raider, M.D.   On: 05/16/2018 07:55   ASSESSMENT AND PLAN:   Acute hypoxic respiratory failure- initial chest x-ray with questionable right lung opacity.  Repeat chest x-ray today with resolution of right lung opacity.  Presentation not consistent with pneumonia due to no fevers, no leukocytosis, negative procalcitonin, no cough.  Likely mild CHF exacerbation versus COPD exacerbation.  Need to consider possible PE, given patient's associated tachycardia. -Will monitor patient off antibiotics -Continue home inhalers -Continue Solu-Medrol IV -Continue home Lasix 40 mg  twice daily -Check ECHO -Repeat two-view CXR in the morning -Wean O2 as able -Palliative care following- plan for goals of care discussion with family tomorrow  AKI in CKD II- creatinine improved, AKI resolving -Monitor -Avoid nephrotoxic agents  Paroxysmal atrial fibrillation-having some intermittent elevated heart rates -Continue amiodarone -Metoprolol IV as needed for HR > 110 -Not on anticoagulation at home due to high risk of falls  Chronic dementia-having some agitation today -Continue home  Zyprexa twice daily -Haldol as needed agitation  All the records are reviewed and case discussed with Care Management/Social Worker. Management plans discussed with the patient, family and they are in agreement.  CODE STATUS: DNR  TOTAL TIME TAKING CARE OF THIS PATIENT: 40 minutes.   More than 50% of the time was spent in counseling/coordination of care: YES  POSSIBLE D/C IN 2-3 DAYS, DEPENDING ON CLINICAL CONDITION.   Jinny Blossom Ella Golomb M.D on 05/16/2018 at 4:00 PM  Between 7am to 6pm - Pager [phone]  After 6pm go to [email or web address] - Social research officer, government  [facility]  Office  [phone]  CC: Primary care physician; [facility]  Note: This dictation was prepared with Dragon dictation along with smaller phrase technology. Any transcriptional errors that result from this process are unintentional.

## 2018-05-16 NOTE — Progress Notes (Signed)
RT to patient bedside for scheduled breathing treatment. Treatment not given due to increased heart rate, upper 120s. Patient SAT is 94% on 2L [place], Clear/Diminished breath sounds. NT at bedside. RN informed of Increased heart rate and treatment not given at this time. Will continue to monitor.

## 2018-05-16 NOTE — Progress Notes (Signed)
Pt becoming increasingly confused and agitated.  Pt difficult to redirect with 1:1 safety sitter at bedside. Primary MD paged. Awaiting response.  [id number]

## 2018-05-16 NOTE — Progress Notes (Signed)
Pt lost IV access around 0200 and was refusing IV team and staff to put in a new IV. MD made aware. PICC consult placed. Pt became calm and cooperative around 0500. IV team called and new access started in LFA.

## 2018-05-17 ENCOUNTER — Inpatient Hospital Stay: Payer: Medicare Other

## 2018-05-17 ENCOUNTER — Encounter: Payer: Self-pay | Admitting: Radiology

## 2018-05-17 DIAGNOSIS — I509 Heart failure, unspecified: Secondary | ICD-10-CM

## 2018-05-17 DIAGNOSIS — J449 Chronic obstructive pulmonary disease, unspecified: Secondary | ICD-10-CM

## 2018-05-17 DIAGNOSIS — Z7189 Other specified counseling: Secondary | ICD-10-CM

## 2018-05-17 DIAGNOSIS — Z515 Encounter for palliative care: Secondary | ICD-10-CM

## 2018-05-17 LAB — BASIC METABOLIC PANEL
Anion gap: 9 (ref 5–15)
BUN: 30 mg/dL — ABNORMAL HIGH (ref 8–23)
CO2: 26 mmol/L (ref 22–32)
Calcium: 8.6 mg/dL — ABNORMAL LOW (ref 8.9–10.3)
Chloride: 102 mmol/L (ref 98–111)
Creatinine, Ser: 1.22 mg/dL (ref 0.61–1.24)
GFR calc Af Amer: >60 mL/min (ref >60)
GFR calc non Af Amer: 52 mL/min — ABNORMAL LOW (ref >60)
Glucose, Bld: 129 mg/dL — ABNORMAL HIGH (ref 70–99)
POTASSIUM: 3.6 mmol/L (ref 3.5–5.1)
Sodium: 137 mmol/L (ref 135–145)

## 2018-05-17 LAB — CBC
HCT: 37.4 % — ABNORMAL LOW (ref 39.0–52.0)
Hemoglobin: 12.1 g/dL — ABNORMAL LOW (ref 13.0–17.0)
MCH: 32.8 pg (ref 26.0–34.0)
MCHC: 32.4 g/dL (ref 30.0–36.0)
MCV: 101.4 fL — ABNORMAL HIGH (ref 80.0–100.0)
Platelets: 210 10*3/uL (ref 150–400)
RBC: 3.69 MIL/uL — ABNORMAL LOW (ref 4.22–5.81)
RDW: 15.7 % — AB (ref 11.5–15.5)
WBC: 9 10*3/uL (ref 4.0–10.5)
nRBC: 0 % (ref 0.0–0.2)

## 2018-05-17 LAB — HIV ANTIBODY (ROUTINE TESTING W REFLEX): HIV Screen 4th Generation wRfx: NONREACTIVE

## 2018-05-17 LAB — ECHOCARDIOGRAM COMPLETE
Height: 74 in
Weight: 3040 oz

## 2018-05-17 MED ORDER — HALOPERIDOL LACTATE 5 MG/ML IJ SOLN
5.0000 mg | Freq: Once | INTRAMUSCULAR | Status: DC
  Filled 2018-05-17: qty 1

## 2018-05-17 MED ORDER — IOHEXOL 350 MG/ML SOLN
75.0000 mL | Freq: Once | INTRAVENOUS | Status: AC | PRN
  Administered 2018-05-17: 75 mL via INTRAVENOUS

## 2018-05-17 MED ORDER — OXYCODONE HCL 5 MG/5ML PO SOLN: 5.0000 mg | ORAL | Status: DC | PRN

## 2018-05-17 MED ORDER — SODIUM CHLORIDE 0.9 % IV SOLN
INTRAVENOUS | Status: DC | PRN
  Administered 2018-05-17 – 2018-05-18 (×2): 500 mL via INTRAVENOUS

## 2018-05-17 MED ORDER — SODIUM CHLORIDE 0.9% FLUSH
3.0000 mL | Freq: Two times a day (BID) | INTRAVENOUS | Status: DC
  Administered 2018-05-17 – 2018-05-18 (×3): 3 mL via INTRAVENOUS

## 2018-05-17 MED ORDER — SODIUM CHLORIDE 0.9 % IV SOLN
1.0000 g | INTRAVENOUS | Status: DC
  Administered 2018-05-17 – 2018-05-18 (×2): 1 g via INTRAVENOUS
  Filled 2018-05-17: qty 1
  Filled 2018-05-17: qty 10
  Filled 2018-05-17: qty 1

## 2018-05-17 MED ORDER — SENNA 8.6 MG PO TABS
1.0000 | ORAL_TABLET | Freq: Every day | ORAL | Status: DC
  Administered 2018-05-17: 8.6 mg via ORAL
  Filled 2018-05-17 (×2): qty 1

## 2018-05-17 MED ORDER — FUROSEMIDE 10 MG/ML IJ SOLN
40.0000 mg | Freq: Two times a day (BID) | INTRAMUSCULAR | Status: DC
  Administered 2018-05-17 – 2018-05-18 (×3): 40 mg via INTRAVENOUS
  Filled 2018-05-17 (×4): qty 4

## 2018-05-17 MED ORDER — POLYETHYLENE GLYCOL 3350 17 G PO PACK: 17.0000 g | PACK | Freq: Every day | ORAL | Status: DC

## 2018-05-17 MED ORDER — HALOPERIDOL LACTATE 5 MG/ML IJ SOLN
5.0000 mg | Freq: Once | INTRAMUSCULAR | Status: AC
  Administered 2018-05-17: 5 mg via INTRAVENOUS

## 2018-05-17 MED ORDER — SODIUM CHLORIDE 0.9% FLUSH: 3.0000 mL | INTRAVENOUS | Status: DC | PRN

## 2018-05-17 MED ORDER — HALOPERIDOL LACTATE 5 MG/ML IJ SOLN
2.0000 mg | Freq: Once | INTRAMUSCULAR | Status: AC
  Administered 2018-05-17: 2 mg via INTRAVENOUS

## 2018-05-17 MED ORDER — HALOPERIDOL LACTATE 5 MG/ML IJ SOLN
INTRAMUSCULAR | Status: AC
  Administered 2018-05-17: 2 mg via INTRAVENOUS
  Filled 2018-05-17: qty 1

## 2018-05-17 MED ORDER — SODIUM CHLORIDE 0.9 % IV SOLN
500.0000 mg | INTRAVENOUS | Status: DC
  Administered 2018-05-17 – 2018-05-18 (×2): 500 mg via INTRAVENOUS
  Filled 2018-05-17 (×3): qty 500

## 2018-05-17 MED ORDER — SENNA 8.6 MG PO TABS: 2.0000 | ORAL_TABLET | Freq: Every day | ORAL | Status: DC | PRN

## 2018-05-17 NOTE — Progress Notes (Signed)
[employer] - [place] at [facility]   PATIENT NAME: [name]    MR#:  [id number]  DATE OF BIRTH:  [birth year]  SUBJECTIVE:   Respiratory status worsened overnight and patient required high flow nasal cannula.  Remains confused and mildly agitated this morning.  REVIEW OF SYSTEMS:  ROS-unable to obtain due to confusion  DRUG ALLERGIES:  No Known Allergies VITALS:  Blood pressure 113/70, pulse 97, temperature 97.7 F (36.5 C), temperature source Oral, resp. rate 16, height 6\' 2"  (1.88 m), weight 86.2 kg, SpO2 96 %. PHYSICAL EXAMINATION:  Physical Exam  Constitutional:  Sitting up in bed in no acute distress. HEENT: Normocephalic, atraumatic, EOMI, no scleral icterus, moist mucous membranes Neck: Normal, full range of motion Cardiovascular:  Tachycardic, regular rhythm. No murmurs, rubs or gallops.  Respiratory: + Diffuse rhonchi, + diminished breath sounds in the lung bases bilaterally, normal work of breathing, nasal cannula in place Gastrointestinal: Soft, nontender and nondistended.  No guarding or rebound.  No peritoneal signs. Musculoskeletal: No LE edema.  No cyanosis or clubbing. Neurologic: Alert and oriented x2.  Able to move all extremities.+ Global weakness.  No focal deficits, sensation intact throughout. Skin:  Skin is warm, dry and intact. No rash noted. Psychiatric: Mood and affect are normal.  LABORATORY PANEL:  Male CBC Recent Labs  Lab 05/17/18 0402  WBC 9.0  HGB 12.1*  HCT 37.4*  PLT 210   ------------------------------------------------------------------------------------------------------------------ Chemistries  Recent Labs  Lab 06/01/2018 1528 05/15/18 0848  05/17/18 0402  NA  --   --    < > 137  K  --   --    < > 3.6  CL  --   --    < > 102  CO2  --   --    < > 26  GLUCOSE  --   --    < > 129*  BUN  --   --    < > 30*  CREATININE  --   --    < > 1.22  CALCIUM  --   --    < > 8.6*  MG  --  2.1  --   --   AST 19  --    --   --   ALT 13  --   --   --   ALKPHOS 104  --   --   --   BILITOT 1.1  --   --   --    < > = values in this interval not displayed.   RADIOLOGY:  Dg Chest 1 View  Result Date: 05/17/2018 CLINICAL DATA:  84 year old male with cough and possible aspiration. EXAM: CHEST  1 VIEW COMPARISON:  Chest radiograph dated 05/16/2018 FINDINGS: Left lung base airspace density, likely atelectasis versus infiltrate. There is diffuse interstitial coarsening. No pleural effusion or pneumothorax. Stable mild cardiomegaly. No acute osseous pathology. IMPRESSION: Left lung base atelectasis versus infiltrate, similar to prior radiograph. Electronically Signed   By: Elgie Collard M.D.   On: 05/17/2018 03:24   Dg Chest 1 View  Result Date: 05/16/2018 CLINICAL DATA:  Dyspnea EXAM: CHEST  1 VIEW COMPARISON:  05/16/2018, 05/29/2018, 09/22/2015 FINDINGS: Rotated patient limits evaluation. No significant interval change in airspace disease at the left lung base. Stable enlarged cardiomediastinal silhouette with aortic atherosclerosis. No pneumothorax. IMPRESSION: 1. No significant change in cardiomegaly or airspace disease at the left lung base. Electronically Signed   By: Jasmine Pang M.D.   On: 05/16/2018  21:26   Ct Angio Chest Pe W Or Wo Contrast  Result Date: 05/17/2018 CLINICAL DATA:  84 year old with acute hypoxic respiratory failure. EXAM: CT ANGIOGRAPHY CHEST WITH CONTRAST TECHNIQUE: Multidetector CT imaging of the chest was performed using the standard protocol during bolus administration of intravenous contrast. Multiplanar CT image reconstructions and MIPs were obtained to evaluate the vascular anatomy. CONTRAST:  75mL OMNIPAQUE IOHEXOL 350 MG/ML IV. COMPARISON:  None. FINDINGS: Respiratory motion blurs images of the mid and LOWER lungs though the study appears diagnostic. Cardiovascular: Contrast opacification of pulmonary arteries is very good. No filling defects within either main pulmonary artery or  their segmental branches in either lung to suggest pulmonary embolism. Heart moderately enlarged. Moderate to severe aortic valvular calcification. Severe three-vessel coronary atherosclerosis. No pericardial effusion. Severe atherosclerosis involving the thoracic and proximal abdominal aorta. Ectatic ascending thoracic aorta measuring up to approximately 4.0 cm diameter. Mediastinum/Nodes: No pathologically enlarged mediastinal, hilar or axillary lymph nodes. No mediastinal masses. Normal-appearing esophagus. Normal-appearing thyroid gland. Lungs/Pleura: Consolidation associated with volume loss involving the lower lobes bilaterally. Lung parenchyma otherwise clear. Emphysematous changes throughout both lungs. No pleural effusions. Central airways patent with mild to moderate bronchial wall thickening. Upper Abdomen: Elevation of the LEFT hemidiaphragm. Prominent diaphragmatic slips which accounts for the apparent irregularity of the hepatic contour. Diverticulosis involving the visualized proximal descending colon. Visualized upper abdomen otherwise unremarkable for the early arterial phase of enhancement. Musculoskeletal: Severe compression fracture of T12 with retropulsion of the UPPER endplate causing mild spinal stenosis; this is a remote fracture as it was present on a chest x-ray in September, 2016. Likely remote compression fracture of T3 with associated vertebral body sclerosis. Exaggeration of the usual thoracic kyphosis. Severe osseous demineralization. Review of the MIP images confirms the above findings. IMPRESSION: 1. No evidence of pulmonary embolism. 2. Bilateral lower lobe atelectasis and/or pneumonia. 3. Ectatic ascending thoracic aorta measuring up to 4.0 cm diameter. Recommend annual imaging followup by CTA or MRA. This recommendation follows 2010 ACCF/AHA/AATS/ACR/ASA/SCA/SCAI/SIR/STS/SVM Guidelines for the Diagnosis and Management of Patients with Thoracic Aortic Disease. 2010; 121: I696-E952.  Aortic Atherosclerosis (ICD10-I70.0) and Emphysema (ICD10-J43.9). Aortic aneurysm NOS (ICD10-I71.9). Electronically Signed   By: Hulan Saas M.D.   On: 05/17/2018 13:30   ASSESSMENT AND PLAN:   Acute hypoxic respiratory failure- likely secondary to mild acute on chronic systolic CHF exacerbation versus COPD exacerbation.  Concern for PE given persistent tachycardia. -Will obtain CTA chest to rule out PE and also to evaluate lungs further -Continue home inhalers -Continue Solu-Medrol IV -Change p.o. Lasix to IV 40 twice daily and reassess -Repeat echo this admission with EF 25 to 30% -Wean O2 as able -Palliative care following- family considering hospice  AKI in CKD II- creatinine back to normal -Avoid nephrotoxic agents -Will need to monitor closely after contrast today  Paroxysmal atrial fibrillation-having some intermittent elevated heart rates -Continue amiodarone -Metoprolol IV as needed for HR > 110 -Not on anticoagulation at home due to high risk of falls  Chronic dementia-having some agitation today -Continue home Zyprexa twice daily -Haldol as needed agitation  All the records are reviewed and case discussed with Care Management/Social Worker. Management plans discussed with the patient, family and they are in agreement.  CODE STATUS: DNR  TOTAL TIME TAKING CARE OF THIS PATIENT: 45 minutes.   More than 50% of the time was spent in counseling/coordination of care: YES  POSSIBLE D/C IN 2-3 DAYS, DEPENDING ON CLINICAL CONDITION.   Jinny Blossom Jahniya Duzan M.D on 05/17/2018  at 2:28 PM  Between 7am to 6pm - Pager - [phone]  After 6pm go to [email or web address] - Social research officer, government  [facility] [place] [facility]  Office  [phone]  CC: Primary care physician; [employer] [employer] [facility]  Note: This dictation was prepared with Dragon dictation along with smaller phrase technology. Any transcriptional errors that result from this process are unintentional.

## 2018-05-17 NOTE — Evaluation (Signed)
Clinical/Bedside Swallow Evaluation Patient Details  Name: [name] MRN: [id number] Date of Birth: May 17, 1926  Today's Date: 05/17/2018 Time: SLP Start Time (ACUTE ONLY): 0930 SLP Stop Time (ACUTE ONLY): 1005 SLP Time Calculation (min) (ACUTE ONLY): 35 min  Past Medical History:  Past Medical History:  Diagnosis Date  . Allergy   . Arrhythmia   . Arthritis   . Asthma   . Chicken pox   . Diverticulitis   . Emphysema of lung (HCC)   . Frequent headaches   . History of colon polyps   . Hypertension    Past Surgical History:  Past Surgical History:  Procedure Laterality Date  . APPENDECTOMY    . BACK SURGERY  ~2011   Lumbar fusion  . TONSILLECTOMY     HPI:  Per admitting H&P: [name]  is a 84 y.o. male with a known history of COPD (3L [place] home O2), HTN, BPH, Afib (no AC), dementia p/w AMS. Not oriented, cannot provide Hx/ROS. Has dementia but has been deteriorating x30mo, poor ambulation, largely bedbound x3wks. CXR demonstrates R lung opacity. SIRS (+), sepsis 2/2 pneumonia (ABCAP vs. aspiration). Suspect pt may be near/at end of life.   Assessment / Plan / Recommendation Clinical Impression  This very pleasant alert 84 year old male presents s/s consistent with moderate oropharyngeal dysphagia. Oral phase c/b mod to severe oral prep/coordination deficits resulting in poor bolus formation, mod oral transit delay, and decreased labial seal with all consistencies tested (nectar and honey thick liquids, puree).  Pt appeared to "chew" puree in attempts to assist A-P transit. Oral cavity appeared very dry, tongue appeared reddened and had a "hard' appearance. Pt appeared hungry and thirsty, leaning in towards SLP to receive trial PO's. Pt unable to consistently follow directions, unable to complete oral mech exam; however observed gross decreased oral motor strength and coordination. Pharyngeal phase c/b likely delay with swallow initiation with all consistencies and likely  laryngeal penetration and/or aspiration with nectar thick liquids by tsp.  Wet throat clear observed x1 out  3 trials nectar thick by tsp. Pt appeared to tolerate honey thick liquids by tsp better with mildly improved swallow initiation and no overt s/s aspiration. Pt at increased risk of aspiration given oral and pharyngeal phase deficits and marked decreased awareness of swallow. Pt often observed to begin speaking before initiating swallow often, greatly increasing risk of aspiration. Given consistent verbal reminders to swallow as pt attempted to speak improved swallow initiation and likely improves safety of swallow. Recommend Dysphagia I diet (Puree) with HONEY thick liquids by TSP only given REGULAR reminders to swallow and NOT TALK while eating. Give meds crushed with puree. Recommend offering PO's slowly, reducing distractions at mealtimes, strict aspiration precautions, regular oral care and application of oral moisturizer. SLP to f/u with toleration of diet and provide education re: safe swallow recommendations. SLP Visit Diagnosis: Dysphagia, oropharyngeal phase (R13.12)     Aspiration Risk  Moderate aspiration risk    Diet Recommendation Dysphagia 1 (Puree);Honey-thick liquid   Liquid Administration via: Spoon Medication Administration: Crushed with puree Supervision: Full supervision/cueing for compensatory strategies Compensations: Minimize environmental distractions;Slow rate;Small sips/bites;Other (Comment)(Consistent reminders to swallow before speaking) Postural Changes: Seated upright at 90 degrees;Remain upright for at least 30 minutes after po intake    Other  Recommendations Oral Care Recommendations: Oral care QID   Follow up Recommendations Home health SLP;Skilled Nursing facility      Frequency and Duration min 2x/week  1 week  Prognosis Prognosis for Safe Diet Advancement: Guarded Barriers to Reach Goals: Cognitive deficits;Severity of deficits       Swallow Study   General Date of Onset: 05/17/18 HPI: Per admitting H&P: [name]  is a 84 y.o. male with a known history of COPD (3L [place] home O2), HTN, BPH, Afib (no AC), dementia p/w AMS. Not oriented, cannot provide Hx/ROS. Has dementia but has been deteriorating x22mo, poor ambulation, largely bedbound x3wks. CXR demonstrates R lung opacity. SIRS (+), sepsis 2/2 pneumonia (ABCAP vs. aspiration). Suspect pt may be near/at end of life. Type of Study: Bedside Swallow Evaluation Diet Prior to this Study: NPO Temperature Spikes Noted: No Respiratory Status: Nasal cannula History of Recent Intubation: No Behavior/Cognition: Alert;Cooperative;Pleasant mood;Requires cueing;Doesn't follow directions Oral Cavity Assessment: Erythema;Dry;Other (comment)(extremely dry) Oral Care Completed by SLP: No Oral Cavity - Dentition: Poor condition;Missing dentition Vision: Impaired for self-feeding Self-Feeding Abilities: Total assist Patient Positioning: Upright in bed Baseline Vocal Quality: Low vocal intensity Volitional Cough: Cognitively unable to elicit Volitional Swallow: Able to elicit    Oral/Motor/Sensory Function Overall Oral Motor/Sensory Function: Moderate impairment Facial ROM: Reduced right;Reduced left Facial Symmetry: Within Functional Limits Facial Strength: Reduced right;Reduced left Lingual ROM: Reduced right;Reduced left Lingual Symmetry: Within Functional Limits Lingual Strength: Reduced Velum: Within Functional Limits Mandible: Impaired   Ice Chips Ice chips: Not tested   Thin Liquid Thin Liquid: Not tested    Nectar Thick Nectar Thick Liquid: Impaired Presentation: Spoon Oral Phase Impairments: Reduced labial seal;Reduced lingual movement/coordination;Poor awareness of bolus Oral phase functional implications: Other (comment)(min attempts at oral prep/coordination) Pharyngeal Phase Impairments: Suspected delayed Swallow;Wet Vocal Quality;Throat Clearing - Immediate    Honey Thick Honey Thick Liquid: Impaired Presentation: Spoon Oral Phase Impairments: Reduced labial seal;Reduced lingual movement/coordination;Poor awareness of bolus Oral Phase Functional Implications: Other (comment)(decreased oral prep) Pharyngeal Phase Impairments: Suspected delayed Swallow   Puree Puree: Impaired Presentation: Spoon Oral Phase Impairments: Reduced labial seal;Reduced lingual movement/coordination;Poor awareness of bolus Oral Phase Functional Implications: Prolonged oral transit;Other (comment)(pt attempted to masticate puree) Pharyngeal Phase Impairments: Decreased hyoid-laryngeal movement Other Comments: Pt at increased risk of aspiration due to min awareness/oral prep coordination, attempting to speak before initiating swallow,   Solid     Solid: Not tested      Dima Ferrufino, MA, CCC-SLP 05/17/2018,1:01 PM

## 2018-05-17 NOTE — Consult Note (Addendum)
Consultation Note Date: 05/17/2018   Patient Name: [name]  DOB: 1925-08-16  MRN: [id number]  Age / Sex: 84 y.o., male  PCP: [facility] Referring Physician: Sela Hua, MD  Reason for Consultation: Establishing goals of care  HPI/Patient Profile: 84 y.o. male  with past medical history of dementia, COPD, CHF a fib, admitted on 05/10/2018 with altered mental status. CHF shows EF 25-35%. PNA ruled out due to clinical picture not consistent, urine negative for UTI. Some increased wheezing noted. Likely COPD, CHF exacerbation- Palliative medicine consulted for [place].     Clinical Assessment and Goals of Care:  I have reviewed medical records including EPIC notes, labs and imaging, assessed the patient and then met at the bedside along with patient's daughter- Carmel [place] and Granddaughter- Avril to discuss diagnosis prognosis, GOC, EOL wishes, disposition and options.  I introduced Palliative Medicine as specialized medical care for people living with serious illness. It focuses on providing relief from the symptoms and stress of a serious illness. The goal is to improve quality of life for both the patient and the family.  We discussed a brief life review of the patient. 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 Was very interested in geneology and computers in his active days. Lives at home with his wife who has dementia and caretakers.   As far as functional and nutritional status- until 4-5 weeks ago he was living bed to chair existence. Ambulating with a walker. Watched tv most of the day. Noted decline over the last 4-5 weeks in which he has not left the bed, has been sleeping more than awake, decreased po intake.     We discussed their current illness and what it means in the larger context of their on-going co-morbidities.  Natural disease trajectory and expectations at  EOL were discussed. We discussed his CHF, COPD, and recent trajectory of functional status indicating the possible approaching of end of life. Family stated he would likely prefer to spend time at home with focus on comfort and quality over extending time given the choice, knowing that he will likely not return to his former functional status.  I attempted to elicit values and goals of care important to the patient. Being at home with his wife is very important. He was known to be independent, cognitively sharp- this decline has been frustrating for him.   The difference between aggressive medical intervention and comfort care was considered in light of the patient's goals of care.   Advanced directives, concepts specific to code status, artifical feeding and hydration, and rehospitalization were considered and discussed. Patient has living will and DNR in place.  Hospice and Palliative Care services outpatient were explained and offered.  Questions and concerns were addressed.  Hard Choices booklet left for review. The family was encouraged to call with questions or concerns.   Primary Decision Maker NEXT OF KIN- children- [name] and Richardson Landry    SUMMARY OF RECOMMENDATIONS -Continue current level of care -Family considering home with  Hospice -Family will contact PMT or attending team today or tomorrow with decision -Oxycodone solution 81m po for SOB r/t CHF, COPD -Bowels- Miralax x 1 today for constipation  -bowel prophylaxis- 1 senna po QHS- noted patient has not  had BM in several days- this can also contribute to agitation -D/C Haldol- family states this makes patient aggressive -Recommend zyprexa sprinkles qhs and delirium precautions       Code Status/Advance Care Planning:  DNR  Palliative Prophylaxis:   Delirium Protocol  Prognosis:    < 3 months d/t end stage CHF, COPD, noted decline in functional and cognitive status   Discharge Planning: To Be Determined - family  considering home with hospice   Primary Diagnoses: Present on Admission: . Sepsis due to pneumonia ([facility]   I have reviewed the medical record, interviewed the patient and family, and examined the patient. The following aspects are pertinent.  Past Medical History:  Diagnosis Date  . Allergy   . Arrhythmia   . Arthritis   . Asthma   . Chicken pox   . Diverticulitis   . [place]   . Frequent headaches   . History of colon polyps   . Hypertension    Social History   Socioeconomic History  . Marital status: Married    Spouse name: Not on file  . Number of children: 2  . Years of education: Not on file  . Highest education level: Not on file  Occupational History  . 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 Financial resource strain: Not on file  . Food insecurity:    Worry: Not on file    Inability: Not on file  . Transportation needs:    Medical: Not on file    Non-medical: Not on file  Tobacco Use  . Smoking status: Former Smoker    Packs/day: 0.25    Years: 60.00    Pack years: 15.00    Types: Cigarettes    Last attempt to quit: 08/05/2014    Years since quitting: 3.7  . Smokeless tobacco: Former USystems developer . Tobacco comment: quit x 1 month  Substance and Sexual Activity  . Alcohol use: No    Alcohol/week: 0.0 standard drinks  . Drug use: No  . Sexual activity: Not Currently  Lifestyle  . Physical activity:    Days per week: Not on file    Minutes per session: Not on file  . Stress: Not on file  Relationships  . Social connections:    Talks on phone: Not on file    Gets together: Not on file    Attends religious service: Not on file    Active member of club or organization: Not on file    Attends meetings of clubs or organizations: Not on file    Relationship status: Not on file  Other Topics Concern  . Not on file  Social History Narrative   Has living will   Wife, then children,  should be health care POA.   Would accept resuscitation   Not sure about tube feedings    Family History  Problem Relation Age of Onset  . Arthritis Mother   . Hypertension Mother   . Hyperlipidemia Father   . Hypertension Father   . Alcohol abuse Brother   . Cancer Brother        Lung   Scheduled Meds: . amiodarone  200 mg Oral Daily  .  aspirin EC  81 mg Oral Daily  . enoxaparin (LOVENOX) injection  40 mg Subcutaneous Q24H  . furosemide  40 mg Oral BID  . gabapentin  100 mg Oral BID  . ipratropium-albuterol  3 mL Nebulization Q6H  . methylPREDNISolone (SOLU-MEDROL) injection  60 mg Intravenous Q12H  . OLANZapine  5 mg Intramuscular Once  . risperiDONE  0.5 mg Oral BID  . sodium chloride flush  3 mL Intravenous Q12H  . tamsulosin  0.4 mg Oral Daily  . tiotropium  18 mcg Inhalation Daily   Continuous Infusions: PRN Meds:.acetaminophen **OR** acetaminophen, albuterol, bisacodyl, iohexol, metoprolol tartrate, ondansetron **OR** ondansetron (ZOFRAN) IV, oxyCODONE, senna-docusate Medications Prior to Admission:  Prior to Admission medications   Medication Sig Start Date End Date Taking? Authorizing Provider  acetaminophen (TYLENOL) 325 MG tablet Take 650 mg by mouth every 6 (six) hours as needed for moderate pain.    Yes [provider]  albuterol (PROVENTIL HFA;VENTOLIN HFA) 108 (90 Base) MCG/ACT inhaler INHALE 2 PUFFS BY MOUTH EVERY 6 HOURS IF NEEDED IF NEEDED FOR SHORTNESS OF BREATH Patient taking differently: Inhale 2 puffs into the lungs every 6 (six) hours as needed for shortness of breath.  01/01/18  Yes Venia Carbon, MD  amiodarone (PACERONE) 200 MG tablet Take 1 tablet (200 mg total) by mouth daily. 04/30/18  Yes Deboraha Sprang, MD  ASPIRIN LOW DOSE 81 MG EC tablet TAKE 1 TABLET BY MOUTH ONCE DAILY Patient taking differently: Take 81 mg by mouth daily.  04/09/18  Yes Venia Carbon, MD  furosemide (LASIX) 80 MG tablet Take 1 tablet (80 mg total) by mouth  daily. Patient taking differently: Take 40 mg by mouth 2 (two) times daily.  05/07/18 08/05/18 Yes Deboraha Sprang, MD  gabapentin (NEURONTIN) 100 MG capsule Take 100 mg by mouth See admin instructions. Take 1 capsule (100MG) by mouth nightly for 1 week then take 1 capsule (100MG) by mouth twice daily 05/08/18  Yes [provider]  SPIRIVA HANDIHALER 18 MCG inhalation capsule INHALE THE CONTENTS OF ONE CAPSULE IN THE [place] Patient taking differently: Place 18 mcg into inhaler and inhale daily.  10/31/17  Yes Venia Carbon, MD  tamsulosin (FLOMAX) 0.4 MG CAPS capsule TAKE ONE CAPSULE BY MOUTH DAILY Patient taking differently: Take 0.4 mg by mouth daily.  11/06/17  Yes Venia Carbon, MD   No Known Allergies Review of Systems  Unable to perform ROS: Dementia    Physical Exam Vitals signs and nursing note reviewed.  Constitutional:      Comments: Frail, ill appearing  HENT:     Head: Normocephalic and atraumatic.  Cardiovascular:     Rate and Rhythm: Normal rate and regular rhythm.  Pulmonary:     Breath sounds: Wheezing present.  Skin:    General: Skin is warm and dry.     Coloration: Skin is pale.     Vital Signs: BP 113/70   Pulse 97   Temp 97.7 F (36.5 C) (Oral)   Resp 16   Ht 6' 2" (1.88 m)   Wt 86.2 kg   SpO2 96%   BMI 24.39 kg/m  Pain Scale: Faces   Pain Score: 0-No pain   SpO2: SpO2: 96 % O2 Device:SpO2: 96 % O2 Flow Rate: .O2 Flow Rate (L/min): 6 L/min  IO: Intake/output summary:   Intake/Output Summary (Last 24 hours) at 05/17/2018 1203 Last data filed at 05/17/2018 1104 Gross per 24 hour  Intake 420 ml  Output 950 ml  Net -530 ml    LBM: Last BM Date: (PTA) Baseline Weight: Weight: 86.2 kg Most recent weight: Weight: 86.2 kg     Palliative Assessment/Data: PPS: 30%   Flowsheet Rows     Most Recent Value  Intake Tab  Referral Department  Hospitalist  Unit at Time of Referral  ER  Palliative Care Primary Diagnosis   Pulmonary  Date Notified  05/15/18  Palliative Care Type  New Palliative care  Reason for referral  Clarify Goals of Care  Date of Admission  05/26/2018  # of days IP prior to Palliative referral  1  Clinical Assessment  Psychosocial & Spiritual Assessment  Palliative Care Outcomes      Thank you for this consult. Palliative medicine will continue to follow and assist as needed.   Time In: 1000 Time Out: 1130 Time Total: 90 minutes Prolonged services billed: yes Greater than 50%  of this time was spent counseling and coordinating care related to the above assessment and plan.  Signed by: Mariana Kaufman, AGNP-C Palliative Medicine    Please contact Palliative Medicine Team phone at [phone] for questions and concerns.  For individual provider: See Shea Evans

## 2018-05-17 NOTE — Plan of Care (Signed)
  Problem: Safety: Goal: Ability to remain free from injury will improve Outcome: Progressing  1:1 sitter

## 2018-05-17 NOTE — Progress Notes (Signed)
Notified by sitter that pt had a change in breathing patttern.  In to see pt. Pt with expiratory wheezing.Notiied MD, orders placed. Will continue to monitor and assess.

## 2018-05-18 LAB — BASIC METABOLIC PANEL
Anion gap: 9 (ref 5–15)
BUN: 29 mg/dL — AB (ref 8–23)
CO2: 27 mmol/L (ref 22–32)
Calcium: 8.6 mg/dL — ABNORMAL LOW (ref 8.9–10.3)
Chloride: 106 mmol/L (ref 98–111)
Creatinine, Ser: 1.2 mg/dL (ref 0.61–1.24)
GFR calc Af Amer: >60 mL/min (ref >60)
GFR calc non Af Amer: 53 mL/min — ABNORMAL LOW (ref >60)
Glucose, Bld: 122 mg/dL — ABNORMAL HIGH (ref 70–99)
Potassium: 3.8 mmol/L (ref 3.5–5.1)
Sodium: 142 mmol/L (ref 135–145)

## 2018-05-18 LAB — CBC
HCT: 37.9 % — ABNORMAL LOW (ref 39.0–52.0)
Hemoglobin: 12.3 g/dL — ABNORMAL LOW (ref 13.0–17.0)
MCH: 32.7 pg (ref 26.0–34.0)
MCHC: 32.5 g/dL (ref 30.0–36.0)
MCV: 100.8 fL — ABNORMAL HIGH (ref 80.0–100.0)
Platelets: 215 10*3/uL (ref 150–400)
RBC: 3.76 MIL/uL — ABNORMAL LOW (ref 4.22–5.81)
RDW: 15.7 % — AB (ref 11.5–15.5)
WBC: 9.2 10*3/uL (ref 4.0–10.5)
nRBC: 0 % (ref 0.0–0.2)

## 2018-05-18 MED ORDER — BISACODYL 10 MG RE SUPP: 10.0000 mg | Freq: Every day | RECTAL | Status: DC | PRN

## 2018-05-18 NOTE — Plan of Care (Signed)
  Problem: Safety: Goal: Ability to remain free from injury will improve Outcome: Progressing   

## 2018-05-18 NOTE — Care Management Important Message (Signed)
Important Message  Patient Details  Name: [name] MRN: [id number] Date of Birth: [birth year]   Medicare Important Message Given:  Yes    [name] 05/18/2018, 11:40 AM

## 2018-05-18 NOTE — Progress Notes (Signed)
[facility] [place] at [facility]   PATIENT NAME: [name]    MR#:  [id number]  DATE OF BIRTH:  1925-08-14  SUBJECTIVE:   On HFNC this morning. Appears confused. Not answering questions.  REVIEW OF SYSTEMS:  ROS-unable to obtain due to confusion  DRUG ALLERGIES:  No Known Allergies VITALS:  Blood pressure 130/78, pulse 86, temperature 97.8 F (36.6 C), temperature source Oral, resp. rate (!) 25, height 6\' 2"  (1.88 m), weight 86.2 kg, SpO2 94 %. PHYSICAL EXAMINATION:  Physical Exam  Constitutional:  Sleeping in bed in no acute distress. HEENT: Normocephalic, atraumatic, EOMI, no scleral icterus, moist mucous membranes Neck: Normal, full range of motion Cardiovascular:  RRR, No murmurs, rubs or gallops.  Respiratory: + Diffuse rhonchi, + diminished breath sounds in the lung bases bilaterally, normal work of breathing, nasal cannula in place Gastrointestinal: Soft, nontender and nondistended.  No guarding or rebound.  No peritoneal signs. Musculoskeletal: No LE edema.  No cyanosis or clubbing. Neurologic: Alert.  Able to move all extremities.+ Global weakness.  No focal deficits, sensation intact throughout. Skin:  Skin is warm, dry and intact. No rash noted. Psychiatric: Mood and affect are normal.  LABORATORY PANEL:  Male CBC Recent Labs  Lab 05/18/18 0541  WBC 9.2  HGB 12.3*  HCT 37.9*  PLT 215   ------------------------------------------------------------------------------------------------------------------ Chemistries  Recent Labs  Lab 2018/05/27 1528 05/15/18 0848  05/18/18 0541  NA  --   --    < > 142  K  --   --    < > 3.8  CL  --   --    < > 106  CO2  --   --    < > 27  GLUCOSE  --   --    < > 122*  BUN  --   --    < > 29*  CREATININE  --   --    < > 1.20  CALCIUM  --   --    < > 8.6*  MG  --  2.1  --   --   AST 19  --   --   --   ALT 13  --   --   --   ALKPHOS 104  --   --   --   BILITOT 1.1  --   --   --    < > = values  in this interval not displayed.   RADIOLOGY:  No results found. ASSESSMENT AND PLAN:   Acute hypoxic respiratory failure- likely secondary to acute on chronic systolic CHF exacerbation, COPD exacerbation, and possible pneumonia. CTA chest yesterday with bilateral lower lobe atelectasis and/or pneumonia. -Continue Solumedrol IV -Continue Lasix 40mg  IV daily -Continue ceftriaxone and azithromycin -Repeat echo this admission with EF 25 to 30% -Wean O2 as able -Palliative care following- family considering taking patient home with hospice. Unable to get in touch with them today.  AKI in CKD II- creatinine back to normal -Avoid nephrotoxic agents -Will need to monitor closely after contrast today  Paroxysmal atrial fibrillation-having some intermittent elevated heart rates -Continue amiodarone -Metoprolol IV as needed for HR > 110 -Not on anticoagulation at home due to high risk of falls  Chronic dementia- stable -Continue home Zyprexa twice daily -Haldol as needed for agitation  All the records are reviewed and case discussed with Care Management/Social Worker. Management plans discussed with the patient, family and they are in agreement.  CODE STATUS: DNR  TOTAL  TIME TAKING CARE OF THIS PATIENT: 45 minutes.   More than 50% of the time was spent in counseling/coordination of care: YES  POSSIBLE D/C IN 2-3 DAYS, DEPENDING ON CLINICAL CONDITION.   Jinny Blossom Mayo M.D on 05/18/2018 at 5:52 PM  Between 7am to 6pm - Pager - [phone]  After 6pm go to [email or web address] - Social research officer, government  [facility] [place] [facility]  Office  [phone]  CC: Primary care physician; [facility]  Note: This dictation was prepared with Dragon dictation along with smaller phrase technology. Any transcriptional errors that result from this process are unintentional.

## 2018-05-19 ENCOUNTER — Inpatient Hospital Stay: Payer: Medicare Other

## 2018-05-19 LAB — CULTURE, BLOOD (ROUTINE X 2)
Culture: NO GROWTH
Culture: NO GROWTH
Special Requests: ADEQUATE

## 2018-05-19 LAB — BASIC METABOLIC PANEL
Anion gap: 7 (ref 5–15)
BUN: 36 mg/dL — ABNORMAL HIGH (ref 8–23)
CO2: 31 mmol/L (ref 22–32)
Calcium: 8.6 mg/dL — ABNORMAL LOW (ref 8.9–10.3)
Chloride: 109 mmol/L (ref 98–111)
Creatinine, Ser: 1.24 mg/dL (ref 0.61–1.24)
GFR calc Af Amer: 59 mL/min — ABNORMAL LOW (ref >60)
GFR calc non Af Amer: 51 mL/min — ABNORMAL LOW (ref >60)
Glucose, Bld: 145 mg/dL — ABNORMAL HIGH (ref 70–99)
Potassium: 3.7 mmol/L (ref 3.5–5.1)
Sodium: 147 mmol/L — ABNORMAL HIGH (ref 135–145)

## 2018-05-19 LAB — CBC
HEMATOCRIT: 40.9 % (ref 39.0–52.0)
Hemoglobin: 12.9 g/dL — ABNORMAL LOW (ref 13.0–17.0)
MCH: 31.9 pg (ref 26.0–34.0)
MCHC: 31.5 g/dL (ref 30.0–36.0)
MCV: 101.2 fL — AB (ref 80.0–100.0)
Platelets: 216 10*3/uL (ref 150–400)
RBC: 4.04 MIL/uL — ABNORMAL LOW (ref 4.22–5.81)
RDW: 15.6 % — ABNORMAL HIGH (ref 11.5–15.5)
WBC: 11.2 10*3/uL — ABNORMAL HIGH (ref 4.0–10.5)
nRBC: 0 % (ref 0.0–0.2)

## 2018-05-19 MED ORDER — GLYCOPYRROLATE 0.2 MG/ML IJ SOLN
0.2000 mg | INTRAMUSCULAR | Status: DC | PRN
  Filled 2018-05-19: qty 1

## 2018-05-19 MED ORDER — MORPHINE SULFATE (PF) 2 MG/ML IV SOLN: 1.0000 mg | INTRAVENOUS | Status: DC | PRN

## 2018-05-19 MED ORDER — LORAZEPAM 1 MG PO TABS: 1.0000 mg | ORAL_TABLET | ORAL | Status: DC | PRN

## 2018-05-19 MED ORDER — MORPHINE SULFATE (PF) 4 MG/ML IV SOLN: 4.0000 mg | INTRAVENOUS | Status: DC | PRN

## 2018-05-19 MED ORDER — MORPHINE SULFATE (PF) 2 MG/ML IV SOLN
INTRAVENOUS | Status: AC
  Administered 2018-05-19: 2 mg via INTRAVENOUS
  Filled 2018-05-19: qty 1

## 2018-05-19 MED ORDER — GLYCOPYRROLATE 1 MG PO TABS
1.0000 mg | ORAL_TABLET | ORAL | Status: DC | PRN
  Filled 2018-05-19: qty 1

## 2018-05-19 MED ORDER — LORAZEPAM 2 MG/ML IJ SOLN: 1.0000 mg | INTRAMUSCULAR | Status: DC | PRN

## 2018-05-19 MED ORDER — DIPHENHYDRAMINE HCL 50 MG/ML IJ SOLN: 12.5000 mg | INTRAMUSCULAR | Status: DC | PRN

## 2018-05-19 MED ORDER — LORAZEPAM 2 MG/ML PO CONC: 1.0000 mg | ORAL | Status: DC | PRN

## 2018-05-26 ENCOUNTER — Ambulatory Visit: Payer: Medicare Other

## 2018-05-28 LAB — BLOOD GAS, VENOUS
Acid-Base Excess: 9.7 mmol/L — ABNORMAL HIGH (ref 0.0–2.0)
Bicarbonate: 35.4 mmol/L — ABNORMAL HIGH (ref 20.0–28.0)
O2 SAT: 59 %
Patient temperature: 37
pCO2, Ven: 51 mmHg (ref 44.0–60.0)
pH, Ven: 7.45 — ABNORMAL HIGH (ref 7.250–7.430)

## 2018-06-06 NOTE — Progress Notes (Addendum)
Tech called nurse and reported that pt oxygen sat was at 74-75% . Nurse came to room and assesed pt. Pt oxygen was at 74-78 %. Pt was on HFNC on 6 liter. Bump HFNC but sat just went up to 83 %. Talked to dr. Sheryle Hail and ordered to place pt on non- rebreater mask. PT was placed on non rebreater mask and sat was at 97-100%. Dr. Sheryle Hail states that he will look on pts chart. Will continue to monitor.  Update 0440: Dr. Sheryle Hail place an order for a chest x-ray. Will continue to monitor.  Update 0500: Pt sat stays between 97-100%. Will continue monitor.

## 2018-06-06 NOTE — Discharge Summary (Signed)
   [facility] - [place] at [facility]   PATIENT NAME: [name]    MR#:  [id number]  DATE OF BIRTH:  1926/04/22  DATE OF ADMISSION:  06/05/2018   ADMITTING PHYSICIAN: Barbaraann Rondo, MD  DATE OF DEATH 05-30-18 at 11:49 PRIMARY CARE PHYSICIAN: [facility]   ADMISSION DIAGNOSIS:  Hypokalemia [E87.6] Acute on chronic renal insufficiency [N28.9, N18.9] T12 burst fracture (HCC) [S22.081A] Altered mental status, unspecified altered mental status type [R41.82] Right middle lobe pulmonary infiltrate [R91.8] DISCHARGE DIAGNOSIS:  Active Problems:   Sepsis due to pneumonia (HCC)   Congestive heart failure (HCC)   Chronic obstructive pulmonary disease (HCC)   Goals of care, counseling/discussion   Palliative care by specialist   Advanced care planning/counseling discussion  SECONDARY DIAGNOSIS:   Past Medical History:  Diagnosis Date  . Allergy   . Arrhythmia   . Arthritis   . Asthma   . Chicken pox   . Diverticulitis   . Emphysema of lung (HCC)   . Frequent headaches   . History of colon polyps   . Hypertension    HOSPITAL COURSE:  [name] a89 y.o.malewith a known history of COPD (3L [place] home O2), HTN, BPH, Afib (no AC), dementia. He was admitted for acute hypoxic respiratory failure- likely secondary to acute on chronic systolic CHF exacerbation, COPD exacerbation, and possible pneumonia. Patient is hypoxia on HF, put on NRB O2, in respiratory distress.   Acute hypoxic respiratory failure- likely secondary to acute on chronic systolic CHF exacerbation, COPD exacerbation, and possible pneumonia. CTA chest: bilateral lower lobe atelectasis and/or pneumonia. He was treated with Solumedrol IV, Lasix, ceftriaxone and azithromycin -Repeat echo this admission with EF 25 to 30%  AKI in CKD II- creatinine back to normal  Paroxysmal atrial fibrillation-having some intermittent elevated heart rates He was on amiodarone -Metoprolol IV  as needed for HR > 110 -Not on anticoagulation at home due to high risk of falls  Chronic dementia- stable He was on home Zyprexa twice daily  He has very poor prognosis. I discussed with his son (POA) about comfort care since the patient is in respiratory distress and suffering. His son and daughter agreed to comfort care, so patient was put on comfort care.  The patient expired at 11:49 today.  Shaune Pollack M.D on 05-30-18 at 12:57 PM  Between 7am to 6pm - Pager - [phone]  After 6pm go to [email or web address] - Social research officer, government  [facility] [place] [facility]  Office  [phone]  CC: Primary care physician; [facility]   Note: This dictation was prepared with Dragon dictation along with smaller phrase technology. Any transcriptional errors that result from this process are unintentional.

## 2018-06-06 NOTE — Progress Notes (Signed)
Pts foley and iv removed/ post mortuum care provided/ transported to morgue

## 2018-06-06 NOTE — Progress Notes (Signed)
Pt pronounced dead by Margaretmary Dys, RN and Larose Hires, RN ( charge nurse) at 11:49am. Dr.Chen made aware/ family at bedside

## 2018-06-06 NOTE — Plan of Care (Signed)
  Problem: Safety: Goal: Ability to remain free from injury will improve Outcome: Progressing   Problem: Health Behavior/Discharge Planning: Goal: Ability to manage health-related needs will improve Outcome: Not Progressing Note:  Pt disoriented x 4

## 2018-06-06 NOTE — Progress Notes (Addendum)
Advanced Care Plan.  Purpose of Encounter: comfort care. Parties in Attendance: The patient, his son, RN and me. Patient's Decisional Capacity: No. Medical Story: [name]  is a 85 y.o. male with a known history of COPD (3L [place] home O2), HTN, BPH, Afib (no AC), dementia. He is admitted for acute hypoxic respiratory failure- likely secondary to acute on chronic systolic CHF exacerbation, COPD exacerbation, and possible pneumonia. Patient is hypoxia on HF, put on NRB O2, still in respiratory distress. He has very poor prognosis. I discussed with his son (POA) about comfort care since the patient is in respiratory distress and suffering. His son agrees to comfort care but needs to discuss with his sister (POA). They decided comfort care. Goal of care: comfort care. Plan: Code Status: DNR.  Time spent discussing advance care planning: 18 minutes.

## 2018-06-06 NOTE — Progress Notes (Signed)
[facility] - [place] at [facility]   PATIENT NAME: Clarence Haisley    MR#:  [id number]  DATE OF BIRTH:  12/05/1925  SUBJECTIVE:   Patient is hypoxia on HF, put on NRB O2, still in respiratory distress.  REVIEW OF SYSTEMS:  ROS-unable to obtain due to confusion  DRUG ALLERGIES:  No Known Allergies VITALS:  Blood pressure (!) 125/96, pulse 86, temperature 98.8 F (37.1 C), temperature source Oral, resp. rate 16, height 6\' 2"  (1.88 m), weight 86.2 kg, SpO2 100 %. PHYSICAL EXAMINATION:  Physical Exam  Constitutional:  In respiratory distress. HEENT: Normocephalic, atraumatic, EOMI, no scleral icterus, moist mucous membranes Neck: Normal Cardiovascular:  RRR, No murmurs, rubs or gallops.  Respiratory: + Diffuse rhonchi, + diminished breath sounds in the lung bases bilaterally, normal work of breathing, nasal cannula in place Gastrointestinal: Soft, nontender and nondistended.  No guarding or rebound.  No peritoneal signs. Musculoskeletal: No LE edema.  No cyanosis or clubbing. Neurologic: Alert.  Able to move all extremities.+ Global weakness.  No focal deficits, sensation intact throughout. Skin:  Skin is warm, dry and intact. No rash noted. Psychiatric: Mood and affect are normal.  LABORATORY PANEL:  Male CBC Recent Labs  Lab 05/22/2018 0337  WBC 11.2*  HGB 12.9*  HCT 40.9  PLT 216   ------------------------------------------------------------------------------------------------------------------ Chemistries  Recent Labs  Lab 05/25/2018 1528 05/15/18 0848  05/29/2018 0337  NA  --   --    < > 147*  K  --   --    < > 3.7  CL  --   --    < > 109  CO2  --   --    < > 31  GLUCOSE  --   --    < > 145*  BUN  --   --    < > 36*  CREATININE  --   --    < > 1.24  CALCIUM  --   --    < > 8.6*  MG  --  2.1  --   --   AST 19  --   --   --   ALT 13  --   --   --   ALKPHOS 104  --   --   --   BILITOT 1.1  --   --   --    < > = values in this interval not  displayed.   RADIOLOGY:  Dg Chest Port 1 View  Result Date: 05/24/2018 CLINICAL DATA:  Hypoxia.  History of emphysema. EXAM: PORTABLE CHEST 1 VIEW COMPARISON:  CTA chest May 17, 2018 FINDINGS: Bibasilar patchy airspace opacity and small pleural effusions. Stable cardiomegaly. Calcified aortic arch. No pneumothorax. Osteopenia. Lumbar hardware. IMPRESSION: 1. Bibasilar atelectasis versus pneumonia with small pleural effusions. 2. Stable cardiomegaly. 3.  Aortic Atherosclerosis (ICD10-I70.0). Electronically Signed   By: Awilda Metro M.D.   On: 05/25/2018 05:24   ASSESSMENT AND PLAN:   Acute hypoxic respiratory failure- likely secondary to acute on chronic systolic CHF exacerbation, COPD exacerbation, and possible pneumonia. CTA chest yesterday with bilateral lower lobe atelectasis and/or pneumonia. -Continue Solumedrol IV -Continue Lasix 40mg  IV daily -Continue ceftriaxone and azithromycin -Repeat echo this admission with EF 25 to 30%  AKI in CKD II- creatinine back to normal -Avoid nephrotoxic agents  Paroxysmal atrial fibrillation-having some intermittent elevated heart rates -Continue amiodarone -Metoprolol IV as needed for HR > 110 -Not on anticoagulation at home due to high risk of falls  Chronic dementia- stable -Continue home Zyprexa twice daily -Haldol as needed for agitation  Very poor prognosis, patient is dying.  All the records are reviewed and case discussed with Care Management/Social Worker. Management plans discussed with the patient's son and they are in agreement.  CODE STATUS: DNR  TOTAL TIME TAKING CARE OF THIS PATIENT: 26 minutes.   More than 50% of the time was spent in counseling/coordination of care: YES  POSSIBLE D/C IN ? DAYS, DEPENDING ON CLINICAL CONDITION.   Shaune Pollack M.D on 05/20/2018 at 10:49 AM  Between 7am to 6pm - Pager - [phone]  After 6pm go to [email or web address] - Social research officer, government  [facility] [place] [facility]    Office  [phone]  CC: Primary care physician; [facility]  Note: This dictation was prepared with Dragon dictation along with smaller phrase technology. Any transcriptional errors that result from this process are unintentional.

## 2018-06-06 NOTE — Progress Notes (Signed)
SLP Cancellation Note  Patient Details Name: [name] MRN: [id number] DOB: 1925-11-29   Cancelled treatment:       Reason Eval/Treat Not Completed: Fatigue/lethargy limiting ability to participate;Medical issues which prohibited therapy;Patient's level of consciousness    NSG stated pt was unresponsive today. SLP attempted to wake pt with no success.   Pt on non re breather since last night.    Meredith Pel Sauber 05/25/2018, 9:23 AM

## 2018-06-06 DEATH — deceased

## 2018-10-23 ENCOUNTER — Other Ambulatory Visit: Payer: Self-pay

## 2018-12-20 ENCOUNTER — Ambulatory Visit: Payer: Medicare Other
